# Patient Record
Sex: Female | Born: 1950 | Race: White | Hispanic: No | Smoking: Former smoker
Health system: Southern US, Community
[De-identification: ages and names within clinical notes are randomized; demographics above are authoritative.]

## PROBLEM LIST (undated history)

## (undated) DIAGNOSIS — M542 Cervicalgia: Secondary | ICD-10-CM

## (undated) DIAGNOSIS — M1990 Unspecified osteoarthritis, unspecified site: Secondary | ICD-10-CM

## (undated) DIAGNOSIS — F419 Anxiety disorder, unspecified: Secondary | ICD-10-CM

## (undated) DIAGNOSIS — M6740 Ganglion, unspecified site: Secondary | ICD-10-CM

## (undated) DIAGNOSIS — L988 Other specified disorders of the skin and subcutaneous tissue: Secondary | ICD-10-CM

## (undated) DIAGNOSIS — R112 Nausea with vomiting, unspecified: Secondary | ICD-10-CM

## (undated) DIAGNOSIS — T464X5A Adverse effect of angiotensin-converting-enzyme inhibitors, initial encounter: Secondary | ICD-10-CM

## (undated) DIAGNOSIS — F32A Depression, unspecified: Secondary | ICD-10-CM

## (undated) DIAGNOSIS — M19049 Primary osteoarthritis, unspecified hand: Secondary | ICD-10-CM

## (undated) DIAGNOSIS — Z87891 Personal history of nicotine dependence: Secondary | ICD-10-CM

## (undated) DIAGNOSIS — R05 Cough: Secondary | ICD-10-CM

## (undated) DIAGNOSIS — I10 Essential (primary) hypertension: Secondary | ICD-10-CM

## (undated) DIAGNOSIS — Z9889 Other specified postprocedural states: Secondary | ICD-10-CM

## (undated) DIAGNOSIS — K219 Gastro-esophageal reflux disease without esophagitis: Secondary | ICD-10-CM

## (undated) DIAGNOSIS — L509 Urticaria, unspecified: Secondary | ICD-10-CM

## (undated) DIAGNOSIS — F329 Major depressive disorder, single episode, unspecified: Secondary | ICD-10-CM

## (undated) DIAGNOSIS — C801 Malignant (primary) neoplasm, unspecified: Secondary | ICD-10-CM

## (undated) DIAGNOSIS — F4323 Adjustment disorder with mixed anxiety and depressed mood: Secondary | ICD-10-CM

## (undated) HISTORY — DX: Personal history of nicotine dependence: Z87.891

## (undated) HISTORY — PX: COLONOSCOPY: SHX174

## (undated) HISTORY — DX: Cervicalgia: M54.2

## (undated) HISTORY — DX: Urticaria, unspecified: L50.9

## (undated) HISTORY — PX: LAPAROSCOPIC APPENDECTOMY: SUR753

## (undated) HISTORY — DX: Adverse effect of angiotensin-converting-enzyme inhibitors, initial encounter: T46.4X5A

## (undated) HISTORY — DX: Primary osteoarthritis, unspecified hand: M19.049

## (undated) HISTORY — DX: Adjustment disorder with mixed anxiety and depressed mood: F43.23

## (undated) HISTORY — DX: Other specified disorders of the skin and subcutaneous tissue: L98.8

## (undated) HISTORY — PX: SHOULDER SURGERY: SHX246

## (undated) HISTORY — DX: Ganglion, unspecified site: M67.40

## (undated) HISTORY — DX: Essential (primary) hypertension: I10

## (undated) HISTORY — PX: MASTECTOMY COMPLETE / SIMPLE W/ SENTINEL NODE BIOPSY: SUR846

## (undated) HISTORY — DX: Cough: R05

## (undated) HISTORY — PX: APPENDECTOMY: SHX54

## (undated) NOTE — *Deleted (*Deleted)
[facility]  [address] [place],  [place]  [address] [phone]  Clinic Day:  01/19/2020  Referring physician: Madelin Headings, MD   This document serves as a record of services personally performed by Heidi Pray, MD. It was created on their behalf by Curry,Lauren E, a trained medical scribe. The creation of this record is based on the scribe's personal observations and the provider's statements to them.   CHIEF COMPLAINT:  CC: History of stage IA HER2 receptor positive right breast cancer  Current Treatment:  Surveillance   HISTORY OF PRESENT ILLNESS:  [name] is a 46 y.o. female with a history of stage IA (T1c N0 M0) HER 2 Neu receptor positive right breast cancer diagnosed in November of 2014.  She had multifocal disease on MRI scan, so opted for bilateral mastectomies with reconstruction, which was done in January 2015.  Pathology revealed multifocal, grade 3, invasive ductal carcinoma of the right breast with the largest lesion measuring 1.7 cm with negative nodes.  Estrogen and progesterone receptors were negative and HER 2 Neu positive.  Ki 67 was 57%.  Adjuvant chemotherapy with docetaxel, carboplatin and trastuzumab was recommended.  She received 2 cycles, but had severe toxicities and was hospitalized after each cycle.  We switched her to weekly paclitaxel with trastuzumab, but she still had significant toxicities with severe skin rash.  We then substituted Abraxane for the paclitaxel, and she did not tolerate this regimen either, so chemotherapy was discontinued.  She did complete a full year of trastuzumab.  Her last echocardiogram in January of 2016 remained normal.    She is here for routine follow up and notes a dull pain in her mid left side that radiates to her mid back for the past 1-2 weeks.  She rates this pain as a 2/10 today.  She states that she fell off the steps, and hit her head and left knee 6 weeks ago, but  denies concussion.  She underwent surgery for her carpal tunnel of the right wrist on November 6th.  She denies changes in either breast.  Her appetite is good, and she has gained 6 1/2 pounds since her last visit.  She denies fever, or chills.  She denies nausea, vomiting, bowel issues, or abdominal pain.  She denies sore throat, cough, dyspnea, or chest pain.  She is due for routine colonoscopy.   INTERVAL HISTORY:  [name] is here for routine follow up ***.   Her  appetite is good, and she has gained/lost _ pounds since her last visit.  She denies fever, chills or other signs of infection.  She denies nausea, vomiting, bowel issues, or abdominal pain.  She denies sore throat, cough, dyspnea, or chest pain.   REVIEW OF SYSTEMS:  Review of Systems - Oncology   VITALS:  There were no vitals taken for this visit.  Wt Readings from Last 3 Encounters:  09/01/18 237 lb 3.2 oz (107.6 kg)  03/15/18 235 lb (106.6 kg)  07/13/17 232 lb 3.2 oz (105.3 kg)    There is no height or weight on file to calculate BMI.  Performance status (ECOG): {CHL ONC [id number]  PHYSICAL EXAM:  Physical Exam  LABS:   CBC Latest Ref Rng & Units 09/01/2018 05/04/2017 03/09/2017  WBC 4.0 - 10.5 K/uL 5.1 5.7 6.4  Hemoglobin 12.0 - 15.0 g/dL 87.5 64.3 32.9  Hematocrit 36 - 46 % 43.1 41.6 42.9  Platelets 150 - 400 K/uL 164.0 182.0 174.0  CMP Latest Ref Rng & Units 09/01/2018 06/10/2017 05/04/2017  Glucose 70 - 99 mg/dL 147(W) 295(A) 213(Y)  BUN 6 - 23 mg/dL 19 22 21   Creatinine 0.40 - 1.20 mg/dL 8.65 7.84 6.96  Sodium 135 - 145 mEq/L 139 141 141  Potassium 3.5 - 5.1 mEq/L 4.7 3.7 3.4(L)  Chloride 96 - 112 mEq/L 102 101 100  CO2 19 - 32 mEq/L 31 31 31   Calcium 8.4 - 10.5 mg/dL 9.5 9.6 9.8  Total Protein 6.0 - 8.3 g/dL 6.7 - -  Total Bilirubin 0.2 - 1.2 mg/dL 1.0 - -  Alkaline Phos 39 - 117 U/L 71 - -  AST 0 - 37 U/L 25 - -  ALT 0 - 35 U/L 25 - -     No results found for: CEA1 / No results found for: CEA1  No results found for: PSA1 No results found for: EXB284 No results found for: XLK440  No results found for: TOTALPROTELP, ALBUMINELP, A1GS, A2GS, BETS, BETA2SER, GAMS, MSPIKE, SPEI No results found for: TIBC, FERRITIN, IRONPCTSAT No results found for: LDH   STUDIES:  No results found.   Allergies:  Allergies  Allergen Reactions  . Ace Inhibitors Cough  . Cephalexin Hives  . Cephalosporins Hives    Possible allergy -- hives   . Codeine Itching and Nausea Only  . Tramadol Hives    Current Medications: Current Outpatient Medications  Medication Sig Dispense Refill  . ALPRAZolam (XANAX) 0.25 MG tablet Take 1 tablet (0.25 mg total) by mouth 3 (three) times daily as needed for anxiety. 24 tablet 0  . blood glucose meter kit and supplies KIT Dispense based on patient and insurance preference. Use up to four times daily as directed. (FOR ICD-9 250.00, 250.01). 1 each 0  . FLUoxetine (PROZAC) 20 MG capsule TAKE 1 CAP DAILY. PLEASE SCHEDULE FOLLOW UP APPOINTMENT FOR REFILLS. [phone] 30 capsule 0  . glucose blood test strip Use as instructed up to 3 times a day 100 each 12  . loratadine (CLARITIN) 10 MG tablet TAKE 1 TABLET BY MOUTH EVERY DAY 90 tablet 1  . metFORMIN (GLUCOPHAGE-XR) 500 MG 24 hr tablet TAKE 2 TABLETS BY MOUTH EVERY DAY 180 tablet 1  . metoprolol tartrate (LOPRESSOR) 50 MG tablet TAKE 1/2 TABLET TWICE A DAY 30 tablet 2  . rosuvastatin (CRESTOR) 10 MG tablet TAKE 1 TABLET BY MOUTH 3 TIMES A WEEK FOR A COUPLE WEEKS THEN INCREASE TO DAILY IF TOLERATED 90 tablet 1   No current facility-administered medications for this visit.     ASSESSMENT & PLAN:   Assessment:   1.  Stage IA HER2 positive right breast cancer diagnosed in November 2014.  She was treated with surgery, partial chemotherapy and a full year of trastuzumab.  She remains without evidence of disease.  Plan: I have recommended that she see a gastroenterologist to evaluate her left flank pain and arrange  for screening colonoscopy.  I can see her back in 1 year for reexamination and she knows she can call sooner if problems arise regarding her breast cancer.  She understands and agrees with this plan of care.   I provided *** minutes (2:39 PM - 2:39 PM) of face-to-face time during this this encounter and > 50% was spent counseling as documented under my assessment and plan.    Dellia Beckwith, MD [facility] [address] [place] [address] Dept: [phone] Dept Fax: [phone]   I, ***,  am acting as scribe for Dellia Beckwith, MD  I have reviewed this report as typed by the medical scribe, and it is complete and accurate.  Nicoletta Ba

---

## 1987-03-04 HISTORY — PX: ABDOMINAL HYSTERECTOMY: SHX81

## 1997-11-13 ENCOUNTER — Emergency Department (HOSPITAL_COMMUNITY): Admission: EM | Admit: 1997-11-13 | Discharge: 1997-11-13 | Payer: Self-pay | Admitting: Internal Medicine

## 1999-03-04 HISTORY — PX: WRIST SURGERY: SHX841

## 1999-07-22 ENCOUNTER — Ambulatory Visit (HOSPITAL_COMMUNITY): Admission: RE | Admit: 1999-07-22 | Discharge: 1999-07-22 | Payer: Self-pay | Admitting: *Deleted

## 2002-08-22 ENCOUNTER — Ambulatory Visit (HOSPITAL_COMMUNITY): Admission: RE | Admit: 2002-08-22 | Discharge: 2002-08-22 | Payer: Self-pay | Admitting: Internal Medicine

## 2002-08-22 ENCOUNTER — Encounter: Payer: Self-pay | Admitting: Internal Medicine

## 2004-05-06 ENCOUNTER — Ambulatory Visit: Payer: Self-pay | Admitting: Internal Medicine

## 2005-02-03 ENCOUNTER — Ambulatory Visit: Payer: Self-pay | Admitting: Internal Medicine

## 2005-02-12 ENCOUNTER — Ambulatory Visit: Payer: Self-pay

## 2005-02-12 ENCOUNTER — Encounter: Payer: Self-pay | Admitting: Cardiology

## 2005-02-13 ENCOUNTER — Encounter: Payer: Self-pay | Admitting: Internal Medicine

## 2005-02-13 ENCOUNTER — Ambulatory Visit: Payer: Self-pay | Admitting: Cardiology

## 2005-09-08 ENCOUNTER — Ambulatory Visit: Payer: Self-pay | Admitting: Internal Medicine

## 2006-09-21 ENCOUNTER — Other Ambulatory Visit: Admission: RE | Admit: 2006-09-21 | Discharge: 2006-09-21 | Payer: Self-pay | Admitting: Gynecology

## 2006-09-28 ENCOUNTER — Encounter: Payer: Self-pay | Admitting: Internal Medicine

## 2006-11-25 DIAGNOSIS — J45909 Unspecified asthma, uncomplicated: Secondary | ICD-10-CM | POA: Insufficient documentation

## 2006-11-30 ENCOUNTER — Ambulatory Visit: Payer: Self-pay | Admitting: Internal Medicine

## 2006-11-30 DIAGNOSIS — M19049 Primary osteoarthritis, unspecified hand: Secondary | ICD-10-CM | POA: Insufficient documentation

## 2006-11-30 DIAGNOSIS — M6740 Ganglion, unspecified site: Secondary | ICD-10-CM | POA: Insufficient documentation

## 2006-11-30 HISTORY — DX: Primary osteoarthritis, unspecified hand: M19.049

## 2007-10-21 ENCOUNTER — Telehealth: Payer: Self-pay | Admitting: *Deleted

## 2007-11-22 ENCOUNTER — Ambulatory Visit: Payer: Self-pay | Admitting: Internal Medicine

## 2007-11-22 DIAGNOSIS — I10 Essential (primary) hypertension: Secondary | ICD-10-CM | POA: Insufficient documentation

## 2007-11-22 DIAGNOSIS — M542 Cervicalgia: Secondary | ICD-10-CM

## 2007-11-22 DIAGNOSIS — F4323 Adjustment disorder with mixed anxiety and depressed mood: Secondary | ICD-10-CM

## 2007-11-22 HISTORY — DX: Cervicalgia: M54.2

## 2007-11-22 HISTORY — DX: Adjustment disorder with mixed anxiety and depressed mood: F43.23

## 2007-11-22 HISTORY — DX: Essential (primary) hypertension: I10

## 2007-12-20 ENCOUNTER — Ambulatory Visit: Payer: Self-pay | Admitting: Internal Medicine

## 2007-12-20 LAB — CONVERTED CEMR LAB: Hemoglobin: 15.2 g/dL

## 2007-12-24 ENCOUNTER — Encounter: Payer: Self-pay | Admitting: Internal Medicine

## 2007-12-24 LAB — CONVERTED CEMR LAB
BUN: 13 mg/dL (ref 6–23)
CO2: 31 meq/L (ref 19–32)
Calcium: 9.3 mg/dL (ref 8.4–10.5)
Chloride: 104 meq/L (ref 96–112)
Cholesterol: 205 mg/dL (ref 0–200)
Creatinine, Ser: 0.8 mg/dL (ref 0.4–1.2)
Direct LDL: 130.6 mg/dL
GFR calc Af Amer: 95 mL/min
GFR calc non Af Amer: 79 mL/min
Glucose, Bld: 122 mg/dL — ABNORMAL HIGH (ref 70–99)
HDL: 41.6 mg/dL (ref >39.0)
Potassium: 3.9 meq/L (ref 3.5–5.1)
Sodium: 143 meq/L (ref 135–145)
Total CHOL/HDL Ratio: 4.9
Triglycerides: 101 mg/dL (ref 0–149)
VLDL: 20 mg/dL (ref 0–40)

## 2008-11-01 ENCOUNTER — Encounter (INDEPENDENT_AMBULATORY_CARE_PROVIDER_SITE_OTHER): Payer: Self-pay | Admitting: *Deleted

## 2008-11-09 ENCOUNTER — Telehealth: Payer: Self-pay | Admitting: Internal Medicine

## 2008-11-20 ENCOUNTER — Ambulatory Visit: Payer: Self-pay | Admitting: Gastroenterology

## 2008-12-04 ENCOUNTER — Ambulatory Visit (HOSPITAL_COMMUNITY): Admission: RE | Admit: 2008-12-04 | Discharge: 2008-12-04 | Payer: Self-pay | Admitting: Gastroenterology

## 2008-12-04 ENCOUNTER — Ambulatory Visit: Payer: Self-pay | Admitting: Gastroenterology

## 2008-12-05 ENCOUNTER — Encounter: Payer: Self-pay | Admitting: Gastroenterology

## 2008-12-20 ENCOUNTER — Telehealth: Payer: Self-pay | Admitting: Internal Medicine

## 2008-12-25 ENCOUNTER — Ambulatory Visit: Payer: Self-pay | Admitting: Internal Medicine

## 2008-12-25 DIAGNOSIS — Z87891 Personal history of nicotine dependence: Secondary | ICD-10-CM

## 2008-12-25 DIAGNOSIS — L988 Other specified disorders of the skin and subcutaneous tissue: Secondary | ICD-10-CM

## 2008-12-25 HISTORY — DX: Other specified disorders of the skin and subcutaneous tissue: L98.8

## 2008-12-25 HISTORY — DX: Personal history of nicotine dependence: Z87.891

## 2008-12-28 LAB — CONVERTED CEMR LAB
BUN: 17 mg/dL (ref 6–23)
CO2: 31 meq/L (ref 19–32)
Calcium: 9.9 mg/dL (ref 8.4–10.5)
Chloride: 101 meq/L (ref 96–112)
Creatinine, Ser: 0.7 mg/dL (ref 0.4–1.2)
GFR calc non Af Amer: 91.29 mL/min (ref >60)
Glucose, Bld: 110 mg/dL — ABNORMAL HIGH (ref 70–99)
Potassium: 4.6 meq/L (ref 3.5–5.1)
Sodium: 140 meq/L (ref 135–145)

## 2010-03-26 ENCOUNTER — Telehealth: Payer: Self-pay | Admitting: *Deleted

## 2010-03-28 ENCOUNTER — Encounter: Payer: Self-pay | Admitting: Internal Medicine

## 2010-04-04 NOTE — Progress Notes (Signed)
Summary: Pt sch ov for 04/08/10. Need partial refill of Triamterene  Phone Note Call from Patient Call back at Work Phone [phone]   Caller: Patient Summary of Call: Pt has check an ov to see Dr Fabian Sharp on 04/08/10 at 3pm, but pt needs refill of Triamterene called in to [facility] in [place], to last until her appt date.   Initial call taken by: Lucy Antigua,  March 26, 2010 9:25 AM    Prescriptions: TRIAMTERENE-HCTZ 37.5-25 MG  TABS (TRIAMTERENE-HCTZ) take 1/2 tablet by mouth once daily  #30 x 0   Entered by:   Romualdo Bolk, CMA (AAMA)   Authorized by:   Madelin Headings MD   Signed by:   Romualdo Bolk, CMA (AAMA) on 03/26/2010   Method used:   Electronically to        [facility] (retail)       [address]       [place], [place]  [address]       Ph: [phone] or [phone]       Fax: [phone]   RxID:   [id number]

## 2010-04-08 ENCOUNTER — Ambulatory Visit: Payer: Self-pay | Admitting: Internal Medicine

## 2010-04-09 ENCOUNTER — Other Ambulatory Visit: Payer: Self-pay | Admitting: Internal Medicine

## 2010-04-09 NOTE — Telephone Encounter (Signed)
I only gave pt a 30 days supply since she is due for a follow up appt. I mailed pt a letter stating that she needs to call the office to schedule a follow up appt before next refill.

## 2010-04-18 ENCOUNTER — Other Ambulatory Visit: Payer: Self-pay | Admitting: Internal Medicine

## 2010-04-22 ENCOUNTER — Encounter: Payer: Self-pay | Admitting: Internal Medicine

## 2010-04-22 ENCOUNTER — Ambulatory Visit (INDEPENDENT_AMBULATORY_CARE_PROVIDER_SITE_OTHER): Payer: BC Managed Care – PPO | Admitting: Internal Medicine

## 2010-04-22 DIAGNOSIS — J45909 Unspecified asthma, uncomplicated: Secondary | ICD-10-CM

## 2010-04-22 DIAGNOSIS — I8390 Asymptomatic varicose veins of unspecified lower extremity: Secondary | ICD-10-CM

## 2010-04-22 DIAGNOSIS — I10 Essential (primary) hypertension: Secondary | ICD-10-CM

## 2010-04-22 DIAGNOSIS — E669 Obesity, unspecified: Secondary | ICD-10-CM

## 2010-04-22 DIAGNOSIS — Z0000 Encounter for general adult medical examination without abnormal findings: Secondary | ICD-10-CM

## 2010-04-22 DIAGNOSIS — F4323 Adjustment disorder with mixed anxiety and depressed mood: Secondary | ICD-10-CM

## 2010-04-22 MED ORDER — TRIAMTERENE-HCTZ 37.5-25 MG PO TABS: 0.5000 | ORAL_TABLET | Freq: Every day | ORAL | Status: DC

## 2010-04-22 MED ORDER — FLUOXETINE HCL 10 MG PO CAPS: 10.0000 mg | ORAL_CAPSULE | Freq: Every day | ORAL | Status: DC

## 2010-04-23 LAB — BASIC METABOLIC PANEL
BUN: 19 mg/dL (ref 6–23)
Creatinine, Ser: 0.8 mg/dL (ref 0.4–1.2)
GFR: 82.64 mL/min (ref >60.00)
Potassium: 3.8 mEq/L (ref 3.5–5.1)

## 2010-04-23 LAB — LIPID PANEL
Cholesterol: 200 mg/dL (ref 0–200)
LDL Cholesterol: 110 mg/dL — ABNORMAL HIGH (ref 0–99)
VLDL: 40 mg/dL (ref 0.0–40.0)

## 2010-04-23 LAB — HEMOGLOBIN: Hemoglobin: 14 g/dL (ref 12.0–15.0)

## 2010-04-24 ENCOUNTER — Encounter: Payer: Self-pay | Admitting: *Deleted

## 2010-04-25 ENCOUNTER — Encounter: Payer: Self-pay | Admitting: Internal Medicine

## 2010-04-25 DIAGNOSIS — I8390 Asymptomatic varicose veins of unspecified lower extremity: Secondary | ICD-10-CM | POA: Insufficient documentation

## 2010-04-25 DIAGNOSIS — E669 Obesity, unspecified: Secondary | ICD-10-CM | POA: Insufficient documentation

## 2010-04-25 NOTE — Assessment & Plan Note (Signed)
Controlled and no se noted with meds

## 2010-04-25 NOTE — Assessment & Plan Note (Signed)
Problematic  Counseled.

## 2010-04-25 NOTE — Assessment & Plan Note (Signed)
Disc options  And will continue as seems to help her mood and anxiety a good bit.

## 2010-04-25 NOTE — Assessment & Plan Note (Signed)
quiescent

## 2010-04-25 NOTE — Assessment & Plan Note (Signed)
Ok to do lipids  And lab as covered by her plan

## 2010-04-25 NOTE — Progress Notes (Signed)
  Subjective:    Patient ID: [name], female    DOB: 1950/12/23, 60 y.o.   MRN: [id number]  HPI  Patient comes in today  For yearly follow up of her  multiple medical issues and med checks. No major change in health status since last visit .   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 HT  Taking med and no se  Mood: helps to stay on prozac. Obesity  :  Realized  A problem Varicose veins : no ulcerations but has sx with this. Wants lab done covered by her plan.  Review of Systems Neg cv pulm sleep  Gi Gu change   See hpi     Objective:   Physical Exam Physical Exam: Vital signs reviewed GLO:VFIE is a well-developed well-nourished alert cooperative  white female who appears her stated age in no acute distress.  HEENT: normocephalic  traumatic , Eyes: PERRL EOM's full, conjunctiva clear, Nares: paten,t no deformity discharge or tenderness., Ears: no deformity EAC's clear TMs with normal landmarks  Left a bit  scarred. Mouth: clear OP, no lesions, edema.  Moist mucous membranes. Dentition in adequate repair. NECK: supple without masses, thyromegaly or bruits. CHEST/PULM:  Clear to auscultation and percussion breath sounds equal no wheeze , rales or rhonchi. No chest wall deformities or tenderness. CV: PMI is nondisplaced, S1 S2 no gallops, murmurs, rubs. Peripheral pulses are full without delay.No JVD .  ABDOMEN: Bowel sounds normal nontender  No guard or rebound, no hepato splenomegal no CVA tenderness.  No hernia. Extremtities:  No clubbing cyanosis   slgiht edema and varicosities without ulceration NEURO:  Oriented x3, cranial nerves 3-12 appear to be intact, no obvious focal weakness,gait within normal limits no abnormal reflexes or asymmetrical SKIN: No acute rashes normal turgor, color, no bruising or petechiae. PSYCH: Oriented, good eye contact, no obvious depression anxiety, cognition and judgment appear normal.         Assessment & Plan:  HT    No change Obesity   Lose weight Mood: stable and stay on meds VV:     Disc options  .  Symptomatic   rx limited by finances

## 2010-07-02 ENCOUNTER — Encounter: Payer: Self-pay | Admitting: Internal Medicine

## 2010-07-18 ENCOUNTER — Telehealth: Payer: Self-pay | Admitting: *Deleted

## 2010-07-18 NOTE — Telephone Encounter (Signed)
Pt is complaining of SOB and chest pain coming off and on when she is at work.  Advised that she should go to the ER to R/O heart problems.  Dr. Fabian Sharp is off today, and our protocol is to send suspicious chest pain to the ER first,

## 2010-07-19 NOTE — Telephone Encounter (Signed)
No answer, no voicemail.

## 2010-07-19 NOTE — Telephone Encounter (Signed)
Please check on patient about how she is doing

## 2010-07-22 NOTE — Telephone Encounter (Signed)
Tried to call pt back but the voicemail box was full and I couldn't leave a message.

## 2010-07-24 ENCOUNTER — Telehealth: Payer: Self-pay | Admitting: *Deleted

## 2010-07-24 NOTE — Telephone Encounter (Signed)
Notified pt. 

## 2010-07-24 NOTE — Telephone Encounter (Signed)
Per Dr. Fabian Sharp- Ok to do and have pt come in for a rov in 2 weeks.

## 2010-07-24 NOTE — Telephone Encounter (Signed)
Pt went to the ER last night.  BP was high, and was given Maxide25/37.4 mg. One po daily. Her  BP was 209/110. Given Lopressor 25 mg. 1/2 q 12 hours.  Wants to know if Dr. Fabian Sharp wants her to take this?

## 2010-08-05 ENCOUNTER — Encounter: Payer: Self-pay | Admitting: Internal Medicine

## 2010-08-05 ENCOUNTER — Ambulatory Visit (INDEPENDENT_AMBULATORY_CARE_PROVIDER_SITE_OTHER): Payer: BC Managed Care – PPO | Admitting: Internal Medicine

## 2010-08-05 VITALS — BP 120/70 | HR 78 | Wt 245.0 lb

## 2010-08-05 DIAGNOSIS — I10 Essential (primary) hypertension: Secondary | ICD-10-CM

## 2010-08-05 DIAGNOSIS — Z87891 Personal history of nicotine dependence: Secondary | ICD-10-CM

## 2010-08-05 DIAGNOSIS — R079 Chest pain, unspecified: Secondary | ICD-10-CM

## 2010-08-05 DIAGNOSIS — F4323 Adjustment disorder with mixed anxiety and depressed mood: Secondary | ICD-10-CM

## 2010-08-05 DIAGNOSIS — E669 Obesity, unspecified: Secondary | ICD-10-CM

## 2010-08-05 MED ORDER — METOPROLOL TARTRATE 25 MG PO TABS: ORAL_TABLET | ORAL | Status: DC

## 2010-08-05 MED ORDER — TRIAMTERENE-HCTZ 37.5-25 MG PO TABS: 1.0000 | ORAL_TABLET | Freq: Every day | ORAL | Status: DC

## 2010-08-05 MED ORDER — METOPROLOL SUCCINATE ER 25 MG PO TB24: 25.0000 mg | ORAL_TABLET | Freq: Every day | ORAL | Status: DC

## 2010-08-05 NOTE — Patient Instructions (Signed)
Continue same dose of med. Will refill. Agree with getting  Bp monitor  Large cuff to  Monitor you Bp readings.  Call in readings in 3 months.   Consider getting stress test  As we discussed and certainly if having chest discomfort .

## 2010-08-18 ENCOUNTER — Encounter: Payer: Self-pay | Admitting: Internal Medicine

## 2010-08-18 DIAGNOSIS — R079 Chest pain, unspecified: Secondary | ICD-10-CM | POA: Insufficient documentation

## 2010-08-18 NOTE — Progress Notes (Signed)
Subjective:    Patient ID: [name], female    DOB: [birth year], 60 y.o.   MRN: [id number]  HPI  Pt comes  in today for  Follow up from ed visit [place]  5 22 for 3 weeks of chest pain left to back and concerns  . Some HB sx also No associated sx otherwise.   Had eval with neg c xray , ekg 2 sets of enzymes blood pressure was in 190 range and given ntg and then lopressor. Since that time taking  Bid and   Feels pretty good. She remains on her diuretic.rest of labs ok but hyperglycemia. No other change in exercise tolerance insn't  really interested in stress test because doing well and   Feels that this was stress after a difficult conversation with someone  And has high deductible insurance and unsure of value of the testing.  She is remaining tobacco free.  Asks about asa 81 mg. Taking   Review of Systems No current cp sob change neck pain vision change, cough ,swelling weight loss fever  weakness  Syncope.  Or OSa sx. Bruising or bleeding or unusual rashes. On fluoxetine  Without se noted and thinks this is helping her stress rest of ros negative  .   No hx of dm or cad or vascular disease.  Did have a" life screen"  In 2006 showing "mild to mod  carotid disease".  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 Has VV  And hx of some procedure     Objective:   Physical Exam WdWn in nad  HEENT: Normocephalic ;atraumatic , Eyes;  PERRL, EOMs  Full, lids and conjunctiva clear,,Ears: no deformities, canals nl, TM landmarks normal, Nose: no deformity or discharge  Mouth : OP clear without lesion or edema . Neck: no masses, bruit ,adenopathy Chest:  Clear to A&P without wheezes rales or rhonchi CV:  S1-S2 no gallops or murmurs peripheral perfusion is normal Neuro grossly normal Ext: No clubbing cyanosis or edema  Has vv no ulcerations Oriented x 3 and no noted deficits in memory, attention, and speech.  Bp  Confirmed . Ed note reviewed as well as  Echo from  2006 and cards note then .       Assessment & Plan:  Chest pain atypical with some risk  Age and ht.  Ex smoker Underinsurance .  Self employed . Hypertension with some possible white coat effect  ?control ... Echo in 2004 and negative  stress test over 10 years ago? Marland KitchenMarland Kitchen Anxiety  Dep  Seems stable  On prozac Obesity  Hx of asthma but no sx on the lopressor She feels she is doing so much better .  B blocker seems a good choice for her situation add on and tolerating well. Thus  Will  Continue at low dose ir bid may be less expensive . Advise Cinda Quest abrupt discontinuation. It is reasonable to take low dose asa 81 at this time.      Discussed risk benefit of testing   ....  Since she has not sx now  will continue for now  If she has  persistent or progressive   Sx she should get further evaluation.  Alarm sx reviewed for which she should see emergent care . For now we can try monitoring  Via phone if her BP is controlled. If not will need to recheck earlier otherwise Ov every 6-12 months.  Patient appears to understand  Choices and implications of such.

## 2011-02-26 ENCOUNTER — Encounter: Payer: Self-pay | Admitting: Internal Medicine

## 2011-02-26 ENCOUNTER — Ambulatory Visit (INDEPENDENT_AMBULATORY_CARE_PROVIDER_SITE_OTHER): Payer: BC Managed Care – PPO | Admitting: Internal Medicine

## 2011-02-26 VITALS — BP 138/70 | HR 79 | Temp 98.0°F | Wt 244.0 lb

## 2011-02-26 DIAGNOSIS — M19049 Primary osteoarthritis, unspecified hand: Secondary | ICD-10-CM

## 2011-02-26 DIAGNOSIS — I10 Essential (primary) hypertension: Secondary | ICD-10-CM

## 2011-02-26 MED ORDER — LISINOPRIL-HYDROCHLOROTHIAZIDE 20-25 MG PO TABS: 1.0000 | ORAL_TABLET | Freq: Every day | ORAL | Status: DC

## 2011-02-26 NOTE — Assessment & Plan Note (Signed)
Change to ace diur combo  An stay on b blocker for now  Consider other adjustments .  rov 3 months with monitor   . High deductable cost a factor.

## 2011-02-26 NOTE — Progress Notes (Signed)
  Subjective:    Patient ID: [name], female    DOB: 1950-07-13, 60 y.o.   MRN: [id number]  HPI Patient comes in today for SDA  For acute problem evaluation. Because of worsening bp readings and was feeling like she did when went to ed in the past. With  Back discomfort and  HA ocass .  Has been tracking bp readings since last visit and was 150 range   Today 160  /99 today  Often forgets about the send dose of metoprolol  Still owrking 4 days per week . Busy with mom taking care of mom fx hip. Some aleve with arthritis in hand  Review of Systems No current cp sob edema  oa hnad arthritis  Last nsaid last week .   Past history family history social history reviewed in the electronic medical record.     Objective:   Physical Exam  BP reading right 144/84 reg   138-140/80 large  Neck: Supple without adenopathy or masses or bruits Chest:  Clear to A&P without wheezes rales or rhonchi CV:  S1-S2 no gallops or murmurs peripheral perfusion is normal No clubbing cyanosis or edema Extr mild oa changes       Assessment & Plan:  hypertension hx of same  Increasing  Systolic  150 range with reg cuff at home Change to ace diuretic combo   For now   Consider changing to metop xr .   Instead of bid dosing  Disc use of med for oa  And bp meds   Plan rov ( or call)  In about 3 months  Can  Do bmp at that time if needed    Disc potential se of changed meds  Continue b blocker stop tmp sxt

## 2011-02-26 NOTE — Patient Instructions (Addendum)
Change BP med as directed .  Otherwise stay on the other meds. Call or OV ( preferred)   in 3 months and will need labs  eventually .   Bring monitor with you  Aleve and advil  Are  good for arthritis but could interfere with Bp medications Can try tylenol based meds if helpful.  Continue to monitor    Your bp readings   Call if not being controlled

## 2011-03-05 ENCOUNTER — Telehealth: Payer: Self-pay

## 2011-03-05 NOTE — Telephone Encounter (Signed)
Pt states she is having some complications with her new BP medication she started in December.  Pt states she started the lisinopril/hctz on 12/28 and she felt "yucky" and tired.  Pt was all right during the weekend and then this am pt got up out of bed fast and was dizzy.  Pt states she is still dizzy right now and pt is experiencing severe pain between her shoulders that has just started.  Pt did not take her bp medication this morning due to being dizzy. Pt states she does not like the lisinopril/hctz and would like to know what Dr. Fabian Sharp recommends.

## 2011-03-05 NOTE — Telephone Encounter (Signed)
Per Dr. Fabian Sharp- have pt cut 1/2 and see how this works. Pt to call back if she starts to get the symptoms again.

## 2011-04-20 IMAGING — RF DG BE W/ AIR HIGH DENSITY
8 series · 9 of 9 positions shown · IV contrast (agent unspecified)
Comparison: None

CLINICAL DATA: Incomplete colonoscopy.

AIR-CONTRAST BARIUM ENEMA
TECHNIQUE: After obtaining a scout radiograph air-contrast barium
enema was performed under fluoroscopy using high-density barium.
Fluoroscopy Time: 3.4 minutes.
Contrast: High density barium with air.

[Series 1: run · 2 of 2 slices shown (1 of 8)]
[im 1/2]
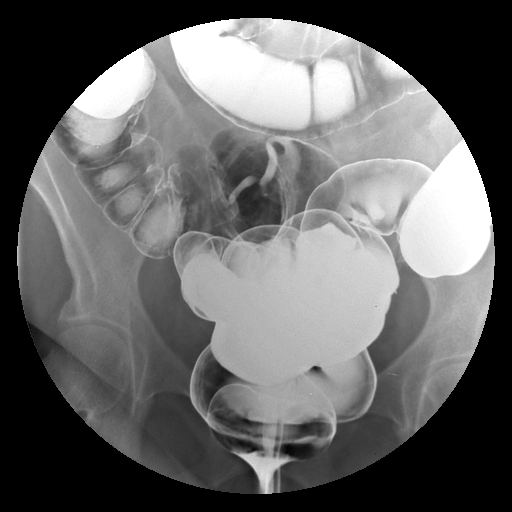
[im 2/2]
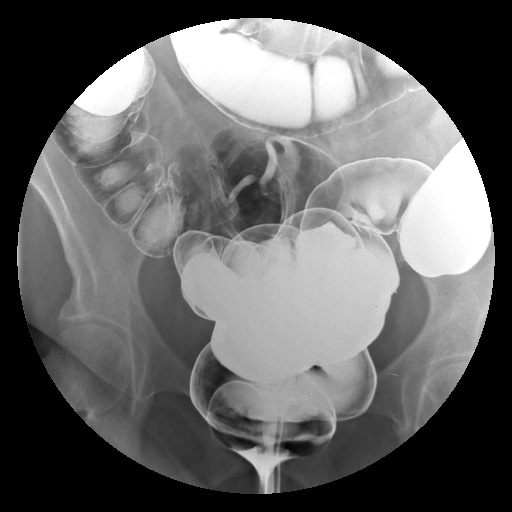

[Series 2: run · 1 of 1 slices shown (2 of 8)]
[im 1/1]
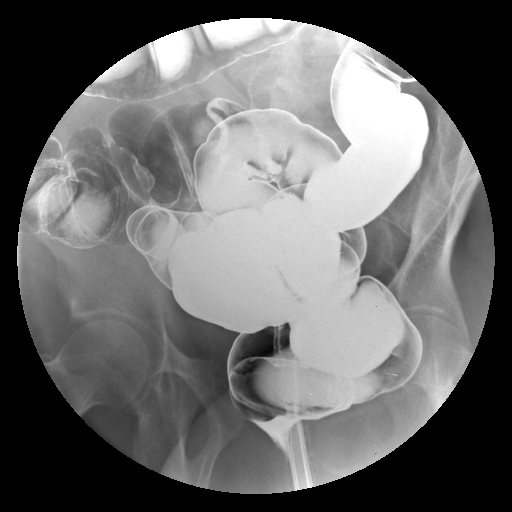

[Series 3: run · 1 of 1 slices shown (3 of 8)]
[im 1/1]
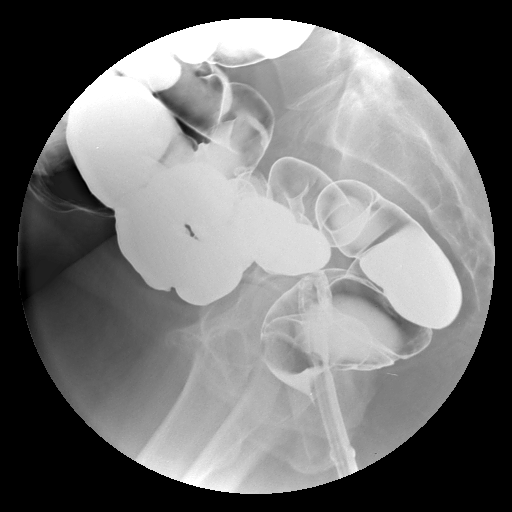

[Series 4: run · 1 of 1 slices shown (4 of 8)]
[im 1/1]
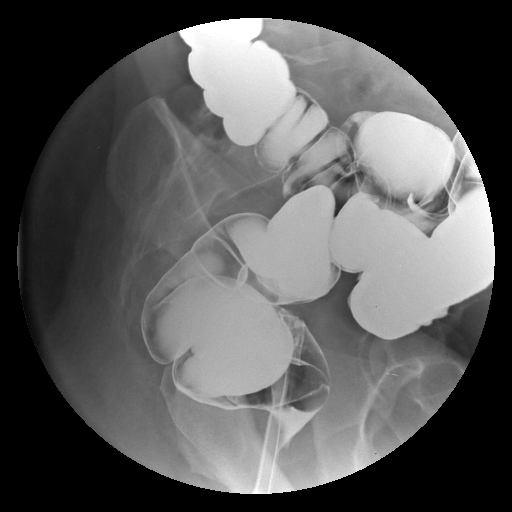

[Series 5: run · 1 of 1 slices shown (5 of 8)]
[im 1/1]
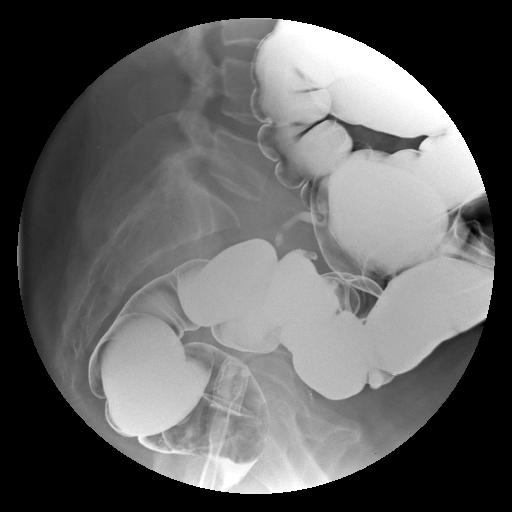

[Series 6: run · 1 of 1 slices shown (6 of 8)]
[im 1/1]
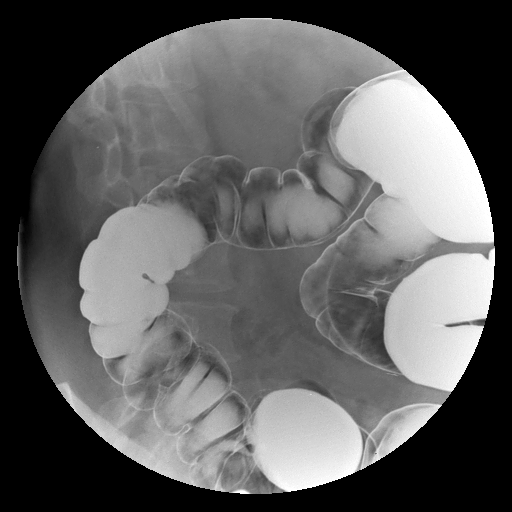

[Series 7: run · 1 of 1 slices shown (7 of 8)]
[im 1/1]
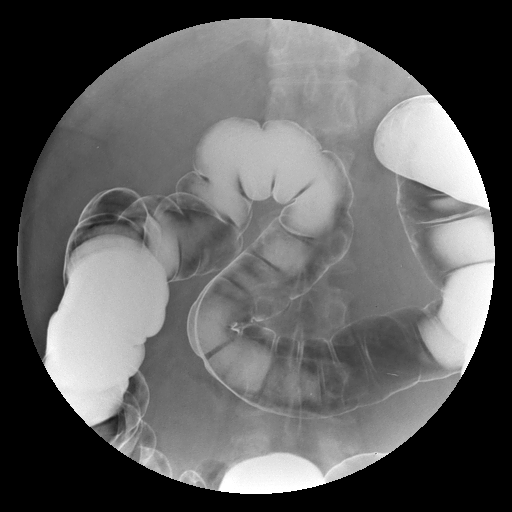

[Series 8: run · 1 of 1 slices shown (8 of 8)]
[im 1/1]
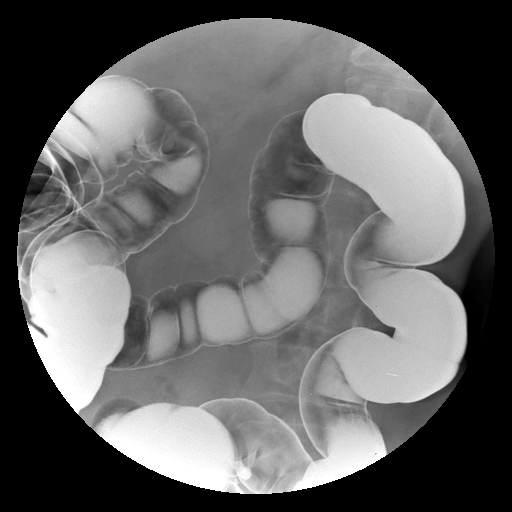

[9 of 9 positions shown; findings below may reference images not displayed]

FINDINGS: There are scattered diverticula in the sigmoid colon.  No
fixed strictures.  No polypoid filling defects within the colon.
No suspicious findings elsewhere throughout the colon.
IMPRESSION: Scattered sigmoid diverticula.  Otherwise unremarkable air contrast
barium enema.

## 2011-04-28 ENCOUNTER — Encounter: Payer: Self-pay | Admitting: Internal Medicine

## 2011-04-28 ENCOUNTER — Ambulatory Visit (INDEPENDENT_AMBULATORY_CARE_PROVIDER_SITE_OTHER): Payer: BC Managed Care – PPO | Admitting: Internal Medicine

## 2011-04-28 DIAGNOSIS — T464X5A Adverse effect of angiotensin-converting-enzyme inhibitors, initial encounter: Secondary | ICD-10-CM

## 2011-04-28 DIAGNOSIS — R05 Cough: Secondary | ICD-10-CM | POA: Insufficient documentation

## 2011-04-28 DIAGNOSIS — R058 Other specified cough: Secondary | ICD-10-CM

## 2011-04-28 DIAGNOSIS — R059 Cough, unspecified: Secondary | ICD-10-CM

## 2011-04-28 DIAGNOSIS — T44905A Adverse effect of unspecified drugs primarily affecting the autonomic nervous system, initial encounter: Secondary | ICD-10-CM

## 2011-04-28 DIAGNOSIS — I10 Essential (primary) hypertension: Secondary | ICD-10-CM

## 2011-04-28 DIAGNOSIS — J040 Acute laryngitis: Secondary | ICD-10-CM

## 2011-04-28 HISTORY — DX: Other specified cough: R05.8

## 2011-04-28 HISTORY — DX: Adverse effect of angiotensin-converting-enzyme inhibitors, initial encounter: T46.4X5A

## 2011-04-28 MED ORDER — BENZONATATE 100 MG PO CAPS: 100.0000 mg | ORAL_CAPSULE | Freq: Three times a day (TID) | ORAL | Status: AC | PRN

## 2011-04-28 MED ORDER — METOPROLOL TARTRATE 50 MG PO TABS: 25.0000 mg | ORAL_TABLET | Freq: Two times a day (BID) | ORAL | Status: DC

## 2011-04-28 NOTE — Patient Instructions (Signed)
I agree that the lisinopril is probably aggravating adding to your coughing fits. We should stop this switch back over to your triamterene hydrochlorothiazide diuretic pill that you were on in the past to take one a day  We will then increase your Lopressor to 25 mg twice a day monitor your blood pressure and pulse.  It may take a few weeks for your cough to be better however if she develops fever or shortness of breath in your chest call us. The meantime increase fluids habits change from cough drops over to sugarless candy to suck on and can try Tessalon Perles for cough suppression.  Increase humidity like in a shower that would help soothe your vocal cords and help for voice rest.  Let us know how your blood pressure is doing in a month or earlier if there are concerns about the cough and the blood pressure control.

## 2011-04-28 NOTE — Progress Notes (Signed)
  Subjective:    Patient ID: [name], female    DOB: March 28, 1950, 61 y.o.   MRN: [id number]  HPI Patient comes in today for an acute appointment with her friend.. she has a concern about a severity of cough that she thinks may be from the medication ocass dry cough  Off and on in the past and then we put her on lisinopril in December.  Last week got a cold ; usually only last 3-4 days  ;head cold  improved but now has very bad coughing fits over the last 3-4 ays and now coughs very badly "dry crazy cough . "  She denies wheezing or asthma and doesn't feel that she has a chest cold or pneumonia symptoms. She has a log of her blood pressure readings and they are anywhere from the 130s to the 140s. She has tried cough drops over-the-counter medications without help She is on low dose beta blocker Lopressor 12.5 mg twice a day. She has a high deductible on her insurance so her using low-cost generics to help control her blood pressure.  Review of Systems Negative for fever or syncope change in vision her hoarseness is without stridor. No nausea vomiting or other illness.  Past history family history social history reviewed in the electronic medical record. 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 Non toxic . HEENT: Normocephalic ;atraumatic , Eyes;  PERRL, EOMs  Full, lids and conjunctiva clear,,Ears: no deformities, canals nl, TM landmarks normal, Nose: no deformity or discharge but congested;face nontender tender Mouth : OP clear without lesion or edema . Neck: Supple without adenopathy or masses or bruits Chest:  Clear to A&P without wheezes rales or rhonchi CV:  S1-S2 no gallops or murmurs peripheral perfusion is normal Skin :nl perfusion and no acute rashes       Assessment & Plan:   Acute respiratory infection that appears convalescent with ACE inhibitor aggravation of cough. No current signs of lower respiratory tract infection. Would consider an asthmatic reaction however patient feels she's not wheezing and it is all in her upper airway.  We'll definitely stop the ACE inhibitor her go back on the Maxzide and increase the Lopressor dosing in the meantime. We could consider other options however there may be financial limitations but of other medications.  Have her stop cough drops and switch to sugar-free candy Tessalon Perles given as a trial in the short run. We'll change her Lopressor to 50 mg and cut it in half twice a day she should keep [place] informed about her blood pressure readings in progress if not we will have to do further evaluation or changes.

## 2011-05-02 ENCOUNTER — Telehealth: Payer: Self-pay | Admitting: *Deleted

## 2011-05-02 NOTE — Telephone Encounter (Signed)
Spoke to pt this has been going for 10-11 days. The cough hasn't changed much. It is less frequent. The head congestion is more a stuffy runny nose. She hasn't tried anything OTC. Advised pt to try claritin, zyrtec or allergra and use saline nasal spray. Then if not better by Monday then give [place] a call.

## 2011-05-02 NOTE — Telephone Encounter (Signed)
Pt is asking if Dr. Fabian Sharp will give her an antibiotic for increased sinus congestion, drainage and cough.   The cough is non productive.  No fever.

## 2011-05-05 ENCOUNTER — Telehealth: Payer: Self-pay | Admitting: *Deleted

## 2011-05-05 MED ORDER — AMOXICILLIN 500 MG PO CAPS: 500.0000 mg | ORAL_CAPSULE | Freq: Three times a day (TID) | ORAL | Status: AC

## 2011-05-05 NOTE — Telephone Encounter (Signed)
Pt aware of this. 

## 2011-05-05 NOTE — Telephone Encounter (Signed)
P is calling back per Dr. Rosezella [place] instructions.

## 2011-05-05 NOTE — Telephone Encounter (Signed)
Spoke to pt- Claritin didn't help over the week. Sinus infection and head congestion x 2 weeks.

## 2011-05-07 ENCOUNTER — Other Ambulatory Visit: Payer: Self-pay | Admitting: Internal Medicine

## 2011-05-26 ENCOUNTER — Ambulatory Visit: Payer: BC Managed Care – PPO | Admitting: Internal Medicine

## 2011-07-28 ENCOUNTER — Other Ambulatory Visit: Payer: Self-pay | Admitting: Internal Medicine

## 2011-07-29 NOTE — Telephone Encounter (Signed)
Pt last seen 04/28/11.  Rx last filled 05/07/11 #90.  Pls advise.

## 2011-07-29 NOTE — Telephone Encounter (Signed)
Ok to refill x 6 months 

## 2011-09-30 ENCOUNTER — Other Ambulatory Visit: Payer: Self-pay | Admitting: Internal Medicine

## 2011-11-07 ENCOUNTER — Other Ambulatory Visit: Payer: Self-pay | Admitting: Internal Medicine

## 2011-12-01 ENCOUNTER — Ambulatory Visit: Payer: BC Managed Care – PPO | Admitting: Family Medicine

## 2011-12-08 ENCOUNTER — Encounter: Payer: Self-pay | Admitting: Internal Medicine

## 2011-12-08 ENCOUNTER — Ambulatory Visit (INDEPENDENT_AMBULATORY_CARE_PROVIDER_SITE_OTHER): Payer: BC Managed Care – PPO | Admitting: Internal Medicine

## 2011-12-08 VITALS — BP 130/78 | HR 74 | Temp 98.4°F | Wt 245.0 lb

## 2011-12-08 DIAGNOSIS — Z8639 Personal history of other endocrine, nutritional and metabolic disease: Secondary | ICD-10-CM

## 2011-12-08 DIAGNOSIS — F4323 Adjustment disorder with mixed anxiety and depressed mood: Secondary | ICD-10-CM

## 2011-12-08 DIAGNOSIS — I8390 Asymptomatic varicose veins of unspecified lower extremity: Secondary | ICD-10-CM

## 2011-12-08 DIAGNOSIS — Z23 Encounter for immunization: Secondary | ICD-10-CM

## 2011-12-08 DIAGNOSIS — R202 Paresthesia of skin: Secondary | ICD-10-CM

## 2011-12-08 DIAGNOSIS — Z299 Encounter for prophylactic measures, unspecified: Secondary | ICD-10-CM

## 2011-12-08 DIAGNOSIS — Z0000 Encounter for general adult medical examination without abnormal findings: Secondary | ICD-10-CM

## 2011-12-08 DIAGNOSIS — I10 Essential (primary) hypertension: Secondary | ICD-10-CM

## 2011-12-08 DIAGNOSIS — R209 Unspecified disturbances of skin sensation: Secondary | ICD-10-CM

## 2011-12-08 MED ORDER — FLUOXETINE HCL 10 MG PO CAPS: 10.0000 mg | ORAL_CAPSULE | Freq: Every day | ORAL | Status: DC

## 2011-12-08 MED ORDER — METOPROLOL TARTRATE 50 MG PO TABS: 25.0000 mg | ORAL_TABLET | Freq: Two times a day (BID) | ORAL | Status: DC

## 2011-12-08 MED ORDER — TRIAMTERENE-HCTZ 37.5-25 MG PO TABS: 1.0000 | ORAL_TABLET | Freq: Every day | ORAL | Status: DC

## 2011-12-08 NOTE — Progress Notes (Signed)
Subjective:    Patient ID: [name], female    DOB: 1951-01-09, 61 y.o.   MRN: [id number]  HPI Patient comes in today  for  new problem evaluation. Onset most recently month of intermittent tingling front  Of legs wheil standing after standing a bit( works as Producer, television/film/video)  Otherwise feels ok no fever weakness severe LBP bowel or bladder changes. Wants to make sure she doesn't have a sugar problem or other.   Her bp has been ok usually high normal but good . Has machine with her today Wishes flu vaccine   Review of Systems ROS:  GEN/ HEENT: No fever, significant weight changes sweats headaches vision problems hearing changes, CV/ PULM; No chest pain shortness of breath cough, syncope,edema  change in exercise tolerance. GI /GU: No adominal pain, vomiting, change in bowel habits. No blood in the stool. No significant GU symptoms. SKIN/HEME: ,no acute skin rashes suspicious lesions or bleeding. No lymphadenopathy, nodules, masses.  NEURO/ PSYCH:  No weakness. No depression anxiety. Stable on current med IMM/ Allergy: No unusual infections.  Allergy .   REST of 12 system review negative except as per HPI     Objective:   Physical Exam BP 130/78  Pulse 74  Temp 98.4 F (36.9 C) (Oral)  Wt 245 lb (111.131 kg)  SpO2 96% Physical Exam: Vital signs reviewed YQM:[id number] is a well-developed well-nourished alert cooperative  white female who appears her stated age in no acute distress.  HEENT: normocephalic atraumatic , Eyes: PERRL EOM's full, conjunctiva clear, Nares: paten,t no deformity discharge or tenderness., Ears: no deformity EAC's clear TMs with normal landmarks. Mouth: clear OP, no lesions, edema.  Moist mucous membranes. Dentition in adequate repair. NECK: supple without masses, thyromegaly or bruits. CHEST/PULM:  Clear to auscultation and percussion breath sounds equal no wheeze , rales or rhonchi.  CV: PMI is nondisplaced, S1 S2 no gallops, murmurs, rubs. Peripheral  pulses are full without delay.No JVD .  ABDOMEN: Bowel sounds normal nontender  No guard or rebound, no hepato splenomegal no CVA tenderness.   Extremtities:  No clubbing cyanosis or edema, no acute joint swelling or redness no focal atrophy has VV no ulcers or streaking or tenderness NEURO:  Oriented x3, cranial nerves 3-12 appear to be intact, no obvious focal weakness,gait within normal limits no abnormal reflexes or asymmetrical normal sensation to monofilament SKIN: No acute rashes normal turgor, color, no bruising or petechiae. PSYCH: Oriented, good eye contact, no obvious depression anxiety, cognition and judgment appear normal. LN: no cervical adenopathy  Wt Readings from Last 3 Encounters:  12/08/11 245 lb (111.131 kg)  04/28/11 243 lb (110.224 kg)  02/26/11 244 lb (110.678 kg)     Lab Results  Component Value Date   HGB 14.0 04/22/2010   GLUCOSE 115* 04/22/2010   CHOL 200 04/22/2010   TRIG 200.0* 04/22/2010   HDL 50.20 04/22/2010   LDLDIRECT 130.6 12/20/2007   LDLCALC 110* 04/22/2010   NA 140 04/22/2010   K 3.8 04/22/2010   CL 104 04/22/2010   CREATININE 0.8 04/22/2010   BUN 19 04/22/2010   CO2 29 04/22/2010       Assessment & Plan:   Tingling  Le  Uncertain cause of but normal exam  Doesn't sound typical of metabolic neuropathy but has  Hx of hyperglycemia  Ht  Due for monitoring lab check a1c  Ht seems controlled monitoring Mood stable on meds  HCM flu vaccine ok today  screening and monitoring labs  If labs ok Intensify lifestyle interventions. Fu if  persistent or progressive Obesity  Losing weight helpful

## 2011-12-08 NOTE — Patient Instructions (Signed)
Continue medication for blood pressure control. Can refill for 90 days because we're doing laboratory studies today Uncertain cause of your tingling but it does not appear to be a diabetic neuropathy at this time uncertain differential could be related to back and a pinch nerve at this time because your exam is good would monitor to healthy lifestyle, lose weight; followup if persistent or progressive Flu vaccine today Check into your insurance to see if the shingles shot is covered can get this in any time

## 2011-12-14 ENCOUNTER — Encounter: Payer: Self-pay | Admitting: Internal Medicine

## 2011-12-14 DIAGNOSIS — R200 Anesthesia of skin: Secondary | ICD-10-CM | POA: Insufficient documentation

## 2011-12-14 DIAGNOSIS — Z8639 Personal history of other endocrine, nutritional and metabolic disease: Secondary | ICD-10-CM | POA: Insufficient documentation

## 2012-06-15 ENCOUNTER — Other Ambulatory Visit: Payer: Self-pay | Admitting: Internal Medicine

## 2012-07-08 ENCOUNTER — Other Ambulatory Visit: Payer: Self-pay | Admitting: Internal Medicine

## 2012-08-22 ENCOUNTER — Other Ambulatory Visit: Payer: Self-pay | Admitting: Internal Medicine

## 2012-08-25 ENCOUNTER — Other Ambulatory Visit: Payer: Self-pay | Admitting: Internal Medicine

## 2012-09-25 ENCOUNTER — Other Ambulatory Visit: Payer: Self-pay | Admitting: Internal Medicine

## 2012-11-05 ENCOUNTER — Encounter: Payer: BC Managed Care – PPO | Admitting: Internal Medicine

## 2012-11-05 DIAGNOSIS — Z0289 Encounter for other administrative examinations: Secondary | ICD-10-CM

## 2012-11-05 NOTE — Progress Notes (Signed)
Document opened and reviewed for OV but NSsame day .  

## 2012-12-20 ENCOUNTER — Ambulatory Visit (INDEPENDENT_AMBULATORY_CARE_PROVIDER_SITE_OTHER): Payer: BC Managed Care – PPO | Admitting: Internal Medicine

## 2012-12-20 ENCOUNTER — Encounter: Payer: Self-pay | Admitting: Internal Medicine

## 2012-12-20 VITALS — BP 126/72 | HR 76 | Temp 97.4°F | Ht 63.75 in | Wt 241.0 lb

## 2012-12-20 DIAGNOSIS — Z23 Encounter for immunization: Secondary | ICD-10-CM

## 2012-12-20 DIAGNOSIS — Z79899 Other long term (current) drug therapy: Secondary | ICD-10-CM | POA: Insufficient documentation

## 2012-12-20 DIAGNOSIS — Z0000 Encounter for general adult medical examination without abnormal findings: Secondary | ICD-10-CM

## 2012-12-20 DIAGNOSIS — I10 Essential (primary) hypertension: Secondary | ICD-10-CM

## 2012-12-20 LAB — HM MAMMOGRAPHY

## 2012-12-20 LAB — LIPID PANEL: HDL: 43.4 mg/dL (ref >39.00)

## 2012-12-20 LAB — HEPATIC FUNCTION PANEL
AST: 29 U/L (ref 0–37)
Albumin: 3.9 g/dL (ref 3.5–5.2)
Alkaline Phosphatase: 63 U/L (ref 39–117)
Total Protein: 7.6 g/dL (ref 6.0–8.3)

## 2012-12-20 LAB — CBC WITH DIFFERENTIAL/PLATELET
Basophils Absolute: 0 10*3/uL (ref 0.0–0.1)
Basophils Relative: 0.3 % (ref 0.0–3.0)
Eosinophils Absolute: 0.3 10*3/uL (ref 0.0–0.7)
Eosinophils Relative: 3.7 % (ref 0.0–5.0)
HCT: 42 % (ref 36.0–46.0)
Hemoglobin: 14.1 g/dL (ref 12.0–15.0)
Lymphocytes Relative: 28.8 % (ref 12.0–46.0)
Lymphs Abs: 2.3 10*3/uL (ref 0.7–4.0)
MCHC: 33.7 g/dL (ref 30.0–36.0)
MCV: 86.3 fl (ref 78.0–100.0)
Neutro Abs: 4.6 10*3/uL (ref 1.4–7.7)
RBC: 4.86 Mil/uL (ref 3.87–5.11)
RDW: 14.1 % (ref 11.5–14.6)
WBC: 7.9 10*3/uL (ref 4.5–10.5)

## 2012-12-20 LAB — BASIC METABOLIC PANEL
CO2: 31 mEq/L (ref 19–32)
Glucose, Bld: 130 mg/dL — ABNORMAL HIGH (ref 70–99)
Potassium: 4.6 mEq/L (ref 3.5–5.1)
Sodium: 141 mEq/L (ref 135–145)

## 2012-12-20 LAB — LDL CHOLESTEROL, DIRECT: Direct LDL: 153.4 mg/dL

## 2012-12-20 LAB — TSH: TSH: 2.53 u[IU]/mL (ref 0.35–5.50)

## 2012-12-20 MED ORDER — METOPROLOL TARTRATE 50 MG PO TABS: ORAL_TABLET | ORAL | Status: DC

## 2012-12-20 MED ORDER — TRIAMTERENE-HCTZ 37.5-25 MG PO TABS: ORAL_TABLET | ORAL | Status: DC

## 2012-12-20 MED ORDER — FLUOXETINE HCL 10 MG PO CAPS: ORAL_CAPSULE | ORAL | Status: DC

## 2012-12-20 NOTE — Patient Instructions (Signed)
150 minutes of exercise weeks  ,  Lose weight  To healthy levels. bmi below 30 is best   Avoid trans fats and processed foods;  Increase fresh fruits and veges to 5 servings per day. And avoid sweet beverages  Including tea and juice. Will notify you  of labs when available.  Recheck cpx in a year .

## 2012-12-20 NOTE — Progress Notes (Signed)
Chief Complaint  Patient presents with  . Annual Exam    meds  . Hypertension    HPI: Patient comes in today for Preventive Health Care visit   Had mammogram today   Had emergency appendectomy sept 5  laparoscopic no complications  .  Doing better.  bp controlled  Will need me drefill   prozac helpful. To continue  VV about the same but  Needs ot use stocking   For at least 3 months for this.   utd on colonoscopy as per  patient  201 o0  ROS:  GEN/ HEENT: No fever, significant weight changes sweats headaches vision problems hearing changes, CV/ PULM; No chest pain shortness of breath cough, syncope,edema  change in exercise tolerance. GI /GU: No adominal pain, vomiting, change in bowel habits. No blood in the stool. No significant GU symptoms. SKIN/HEME: ,no acute skin rashes suspicious lesions or bleeding. No lymphadenopathy, nodules, masses.  NEURO/ PSYCH:  No neurologic signs such as weakness numbness. No depression anxiety. IMM/ Allergy: No unusual infections.  Allergy .   REST of 12 system review negative except as per HPI   Past Medical History  Diagnosis Date  . 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 Other specified disorder of skin 12/25/2008  . 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 NECK PAIN 11/22/2007  . TOBACCO USE, QUIT 12/25/2008  . ASTHMA 11/25/2006  . HYPERTENSION 11/22/2007    echo card 2004 nl lv function   . Ganglion cyst   . Cough due to ACE inhibitor 04/28/2011    Family History  Problem Relation Age of Onset  . Alzheimer's disease Mother   . Hypertension Mother   . Diabetes Other     1st degree relative  . Diabetes Father     History   Social History  . Marital Status: Single    Spouse Name: N/A    Number of Children: N/A  . Years of Education: N/A   Occupational History  . Hairdresser    Social History Main Topics  . Smoking status: Former Games developer  . Smokeless tobacco: Never Used  . Alcohol Use: No  . Drug  Use: No  . Sexual Activity: None   Other Topics Concern  . 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    Former tobacco  No tad     Divorced.       Brother temp , son back in for temporarily.  People      Dog     Outpatient Encounter Prescriptions as of 12/20/2012  Medication Sig Dispense Refill  . aspirin 81 MG tablet Take 81 mg by mouth daily.        Marland Kitchen FLUoxetine (PROZAC) 10 MG capsule TAKE ONE CAPSULE BY MOUTH ONCE DAILY  90 capsule  3  . metoprolol (LOPRESSOR) 50 MG tablet TAKE ONE-HALF TABLET BY MOUTH TWICE DAILY  90 tablet  1  . triamterene-hydrochlorothiazide (MAXZIDE-25) 37.5-25 MG per tablet TAKE ONE TABLET BY MOUTH EVERY DAY  90 tablet  3  . [DISCONTINUED] FLUoxetine (PROZAC) 10 MG capsule TAKE ONE CAPSULE BY MOUTH ONCE DAILY  90 capsule  0  . [DISCONTINUED] metoprolol (LOPRESSOR) 50 MG tablet TAKE ONE-HALF TABLET BY MOUTH TWICE DAILY  90 tablet  1  . [DISCONTINUED] triamterene-hydrochlorothiazide (MAXZIDE-25) 37.5-25 MG per tablet TAKE ONE TABLET BY MOUTH EVERY  DAY  90 tablet  1   No facility-administered encounter medications on file as of 12/20/2012.    EXAM:  BP 126/72  Pulse 76  Temp(Src) 97.4 F (36.3 C) (Oral)  Ht 5' 3.75" (1.619 m)  Wt 241 lb (109.317 kg)  BMI 41.71 kg/m2  SpO2 96%  Body mass index is 41.71 kg/(m^2).  Physical Exam: Vital signs reviewed [id number] is a well-developed well-nourished alert cooperative   female who appears her stated age in no acute distress.  HEENT: normocephalic atraumatic , Eyes: PERRL EOM's full, conjunctiva clear, Nares: paten,t no deformity discharge or tenderness., Ears: no deformity EAC's clear TMs with normal landmarks. Mouth: clear OP, no lesions, edema.  Moist mucous membranes. Dentition in adequate repair. NECK: supple without masses, thyromegaly or bruits. CHEST/PULM:  Clear to auscultation and percussion breath sounds  equal no wheeze , rales or rhonchi. No chest wall deformities or tenderness. Breast: normal by inspection . No dimpling, discharge, masses, tenderness or discharge . CV: PMI is nondisplaced, S1 S2 no gallops, murmurs, rubs. Peripheral pulses are full without delay.No JVD .  ABDOMEN: Bowel sounds normal nontender  No guard or rebound, no hepato splenomegal no CVA tenderness.  No hernia.wellhealed  Lap scars no hernia  Extremtities:  No clubbing cyanosis or edema, no acute joint swelling or redness no focal atrophy VV noted no ulcers  NEURO:  Oriented x3, cranial nerves 3-12 appear to be intact, no obvious focal weakness,gait within normal limits no abnormal reflexes or asymmetrical SKIN: No acute rashes normal turgor, color, no bruising or petechiae. PSYCH: Oriented, good eye contact, no obvious depression anxiety, cognition and judgment appear normal. LN: no cervical axillary inguinal adenopathy  Lab Results  Component Value Date   WBC 7.9 12/20/2012   HGB 14.1 12/20/2012   HCT 42.0 12/20/2012   PLT 216.0 12/20/2012   GLUCOSE 130* 12/20/2012   CHOL 228* 12/20/2012   TRIG 275.0* 12/20/2012   HDL 43.40 12/20/2012   LDLDIRECT 153.4 12/20/2012   LDLCALC 110* 04/22/2010   ALT 29 12/20/2012   AST 29 12/20/2012   NA 141 12/20/2012   K 4.6 12/20/2012   CL 102 12/20/2012   CREATININE 0.7 12/20/2012   BUN 20 12/20/2012   CO2 31 12/20/2012   TSH 2.53 12/20/2012    ASSESSMENT AND PLAN:  Discussed the following assessment and plan:  Visit for preventive health examination - Plan: HM MAMMOGRAPHY, HM PAP SMEAR, Flu Vaccine QUAD 36+ mos PF IM (Fluarix), Tdap vaccine greater than or equal to 7yo IM, Varicella-zoster vaccine subcutaneous, Basic metabolic panel, CBC with Differential, Hepatic function panel, TSH, Lipid panel  HYPERTENSION - controlled  - Plan: HM MAMMOGRAPHY, HM PAP SMEAR, Flu Vaccine QUAD 36+ mos PF IM (Fluarix), Tdap vaccine greater than or equal to 7yo IM, Varicella-zoster  vaccine subcutaneous, Basic metabolic panel  Need for prophylactic vaccination and inoculation against influenza - Plan: HM MAMMOGRAPHY, HM PAP SMEAR, Flu Vaccine QUAD 36+ mos PF IM (Fluarix), Tdap vaccine greater than or equal to 7yo IM, Varicella-zoster vaccine subcutaneous, Basic metabolic panel, CBC with Differential, Hepatic function panel, TSH, Lipid panel  Need for prophylactic vaccination with combined diphtheria-tetanus-pertussis (DTP) vaccine - Plan: HM MAMMOGRAPHY, HM PAP SMEAR, Flu Vaccine QUAD 36+ mos PF IM (Fluarix), Tdap vaccine greater than or equal to 7yo IM, Varicella-zoster vaccine subcutaneous, Basic metabolic panel, CBC with Differential, Hepatic function panel, TSH, Lipid panel  Need for prophylactic vaccination and inoculation against other viral diseases(V04.89) - Plan: HM MAMMOGRAPHY, HM PAP SMEAR, Flu  Vaccine QUAD 36+ mos PF IM (Fluarix), Tdap vaccine greater than or equal to 7yo IM, Varicella-zoster vaccine subcutaneous, Basic metabolic panel, CBC with Differential, Hepatic function panel, TSH, Lipid panel  Counseled regarding healthy nutrition, exercise, sleep, injury prevention, calcium vit d and healthy weight . Advise weight loss Patient Care Team: Madelin Headings, MD as PCP - General Janifer Adie, MD (Obstetrics and Gynecology) Patient Instructions  150 minutes of exercise weeks  ,  Lose weight  To healthy levels. bmi below 30 is best   Avoid trans fats and processed foods;  Increase fresh fruits and veges to 5 servings per day. And avoid sweet beverages  Including tea and juice. Will notify you  of labs when available.  Recheck cpx in a year .     Neta Mends. Panosh M.D. Health Maintenance  Topic Date Due  . Influenza Vaccine  10/01/2013  . Mammogram  12/21/2014  . Colonoscopy  12/05/2018  . Tetanus/tdap  12/21/2022  . Zostavax  Completed   Health Maintenance Review }

## 2012-12-27 ENCOUNTER — Other Ambulatory Visit: Payer: Self-pay | Admitting: Radiology

## 2012-12-28 ENCOUNTER — Other Ambulatory Visit: Payer: Self-pay | Admitting: Radiology

## 2012-12-28 DIAGNOSIS — C50911 Malignant neoplasm of unspecified site of right female breast: Secondary | ICD-10-CM

## 2013-01-04 ENCOUNTER — Ambulatory Visit
Admission: RE | Admit: 2013-01-04 | Discharge: 2013-01-04 | Disposition: A | Discharge status: Home / Self Care | Payer: BC Managed Care – PPO | Source: Ambulatory Visit | Attending: Radiology | Admitting: Radiology

## 2013-01-04 DIAGNOSIS — C50911 Malignant neoplasm of unspecified site of right female breast: Secondary | ICD-10-CM

## 2013-01-04 MED ORDER — GADOBENATE DIMEGLUMINE 529 MG/ML IV SOLN
20.0000 mL | Freq: Once | INTRAVENOUS | Status: AC | PRN
  Administered 2013-01-04: 20 mL via INTRAVENOUS

## 2013-01-05 ENCOUNTER — Other Ambulatory Visit: Payer: Self-pay | Admitting: Surgery

## 2013-01-05 DIAGNOSIS — R928 Other abnormal and inconclusive findings on diagnostic imaging of breast: Secondary | ICD-10-CM

## 2013-01-07 ENCOUNTER — Other Ambulatory Visit: Payer: Self-pay | Admitting: Surgery

## 2013-01-07 DIAGNOSIS — R928 Other abnormal and inconclusive findings on diagnostic imaging of breast: Secondary | ICD-10-CM

## 2013-01-14 ENCOUNTER — Ambulatory Visit
Admission: RE | Admit: 2013-01-14 | Discharge: 2013-01-14 | Disposition: A | Discharge status: Home / Self Care | Payer: BC Managed Care – PPO | Source: Ambulatory Visit | Attending: Surgery | Admitting: Surgery

## 2013-01-14 DIAGNOSIS — R928 Other abnormal and inconclusive findings on diagnostic imaging of breast: Secondary | ICD-10-CM

## 2013-01-14 MED ORDER — GADOBENATE DIMEGLUMINE 529 MG/ML IV SOLN
18.0000 mL | Freq: Once | INTRAVENOUS | Status: AC | PRN
  Administered 2013-01-14: 18 mL via INTRAVENOUS

## 2013-02-01 ENCOUNTER — Ambulatory Visit (INDEPENDENT_AMBULATORY_CARE_PROVIDER_SITE_OTHER): Payer: BC Managed Care – PPO | Admitting: General Surgery

## 2013-02-01 ENCOUNTER — Encounter (INDEPENDENT_AMBULATORY_CARE_PROVIDER_SITE_OTHER): Payer: Self-pay | Admitting: General Surgery

## 2013-02-01 ENCOUNTER — Encounter (INDEPENDENT_AMBULATORY_CARE_PROVIDER_SITE_OTHER): Payer: Self-pay

## 2013-02-01 VITALS — BP 140/80 | HR 64 | Temp 96.9°F | Resp 20 | Ht 63.0 in | Wt 238.0 lb

## 2013-02-01 DIAGNOSIS — C50919 Malignant neoplasm of unspecified site of unspecified female breast: Secondary | ICD-10-CM

## 2013-02-01 DIAGNOSIS — C50911 Malignant neoplasm of unspecified site of right female breast: Secondary | ICD-10-CM | POA: Insufficient documentation

## 2013-02-01 NOTE — Patient Instructions (Signed)
Meet with Dr. Odis Luster tomorrow and let me know what you decide

## 2013-02-02 ENCOUNTER — Other Ambulatory Visit (INDEPENDENT_AMBULATORY_CARE_PROVIDER_SITE_OTHER): Payer: Self-pay | Admitting: General Surgery

## 2013-02-02 DIAGNOSIS — C50911 Malignant neoplasm of unspecified site of right female breast: Secondary | ICD-10-CM

## 2013-02-04 NOTE — Progress Notes (Signed)
Patient ID: [name], female   DOB: 09/21/1950, 62 y.o.   MRN: [id number]  Chief Complaint  Patient presents with  . Breast Cancer    HPI [name] is a 62 y.o. female.  We're asked to see the patient in consultation by Dr. Gilman Buttner to evaluate her for right breast cancer. The patient is a 62 year old white female who recently went for a routine screening mammogram. At that time she was found to have a small area in the right breast that was abnormal. As was biopsied and came back as breast cancer. The MRI unfortunately showed a much larger area of involvement as well as multiple areas of involvement. She is also HER-2 positive and will require chemotherapy. She denies any breast pain. She denies any discharge or nipple.  HPI  Past Medical History  Diagnosis Date  . 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 Other specified disorder of skin 12/25/2008  . 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 NECK PAIN 11/22/2007  . TOBACCO USE, QUIT 12/25/2008  . ASTHMA 11/25/2006  . HYPERTENSION 11/22/2007    echo card 2004 nl lv function   . Ganglion cyst   . Cough due to ACE inhibitor 04/28/2011    Past Surgical History  Procedure Laterality Date  . Abdominal hysterectomy  1989  . Laparoscopic appendectomy  11/05/12   . Wrist surgery  2001    Family History  Problem Relation Age of Onset  . Alzheimer'Jackson disease Mother   . Hypertension Mother   . Diabetes Other     1st degree relative  . Diabetes Father   . Bladder Cancer Brother     Social History History  Substance Use Topics  . Smoking status: Former Smoker    Quit date: 03/04/1987  . Smokeless tobacco: Never Used  . Alcohol Use: No    Allergies  Allergen Reactions  . Ace Inhibitors Cough  . Codeine Itching and Nausea Only    Current Outpatient Prescriptions  Medication Sig Dispense Refill  . aspirin 81 MG tablet Take 81 mg by mouth daily.        Marland Kitchen FLUoxetine (PROZAC) 10 MG capsule TAKE  ONE CAPSULE BY MOUTH ONCE DAILY  90 capsule  3  . metoprolol (LOPRESSOR) 50 MG tablet TAKE ONE-HALF TABLET BY MOUTH TWICE DAILY  90 tablet  1  . triamterene-hydrochlorothiazide (MAXZIDE-25) 37.5-25 MG per tablet TAKE ONE TABLET BY MOUTH EVERY DAY  90 tablet  3   No current facility-administered medications for this visit.    Review of Systems Review of Systems  Constitutional: Negative.   HENT: Negative.   Eyes: Negative.   Respiratory: Negative.   Cardiovascular: Negative.   Gastrointestinal: Negative.   Endocrine: Negative.   Genitourinary: Negative.   Musculoskeletal: Negative.   Skin: Negative.   Allergic/Immunologic: Negative.   Neurological: Negative.   Hematological: Negative.   Psychiatric/Behavioral: Negative.     Blood pressure 140/80, pulse 64, temperature 96.9 F (36.1 C), temperature source Temporal, resp. rate 20, height 5\' 3"  (1.6 m), weight 238 lb (107.956 kg).  Physical Exam Physical Exam  Constitutional: She is oriented to person, place, and time. She appears well-developed and well-nourished.  HENT:  Head: Normocephalic and atraumatic.  Eyes: Conjunctivae and EOM are normal. Pupils are equal, round, and reactive to light.  Neck: Normal range of motion. Neck supple.  Cardiovascular: Normal rate, regular rhythm and normal heart sounds.   Pulmonary/Chest: Effort normal and breath sounds normal.  There  is no palpable mass in either breast. There is no palpable axillary, supraclavicular, or cervical lymphadenopathy  Abdominal: Soft. Bowel sounds are normal. She exhibits no mass. There is no tenderness.  Musculoskeletal: Normal range of motion.  Lymphadenopathy:    She has no cervical adenopathy.  Neurological: She is alert and oriented to person, place, and time.  Skin: Skin is warm and dry.  Psychiatric: She has a normal mood and affect. Her behavior is normal.    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 Because of this I think the most appropriate surgical treatment for her would be a mastectomy with sentinel node mapping. I have discussed with her in detail the risks and benefits of the operation to this as well as some of the technical aspects and she understands and wishes to proceed. She actually favors bilateral mastectomies with reconstruction. She will be meeting with Dr. Odis Luster to discuss the options. Because of her need for chemotherapy she will also need a Port-A-Cath placed. I've also discussed with her the risks and benefits as well as some of the technical aspects of placing a port and she understands and would like to proceed with that as well     Plan    Plan for bilateral mastectomy with right breast sentinel node biopsy and Port-A-Cath placement with reconstruction by Dr. Elmarie Shiley Jackson,[name] 02/04/2013, 1:12 PM

## 2013-03-02 ENCOUNTER — Other Ambulatory Visit: Payer: Self-pay | Admitting: Plastic Surgery

## 2013-03-07 ENCOUNTER — Encounter (HOSPITAL_COMMUNITY)
Admission: RE | Admit: 2013-03-07 | Discharge: 2013-03-07 | Disposition: A | Discharge status: Home / Self Care | Payer: BC Managed Care – PPO | Source: Ambulatory Visit | Attending: General Surgery | Admitting: General Surgery

## 2013-03-07 ENCOUNTER — Encounter (HOSPITAL_COMMUNITY)
Admission: RE | Admit: 2013-03-07 | Discharge: 2013-03-07 | Disposition: A | Discharge status: Home / Self Care | Payer: BC Managed Care – PPO | Source: Ambulatory Visit | Attending: Anesthesiology | Admitting: Anesthesiology

## 2013-03-07 ENCOUNTER — Encounter (HOSPITAL_COMMUNITY): Payer: Self-pay

## 2013-03-07 HISTORY — DX: Major depressive disorder, single episode, unspecified: F32.9

## 2013-03-07 HISTORY — DX: Unspecified osteoarthritis, unspecified site: M19.90

## 2013-03-07 HISTORY — DX: Other specified postprocedural states: Z98.890

## 2013-03-07 HISTORY — DX: Anxiety disorder, unspecified: F41.9

## 2013-03-07 HISTORY — DX: Nausea with vomiting, unspecified: R11.2

## 2013-03-07 HISTORY — DX: Depression, unspecified: F32.A

## 2013-03-07 HISTORY — DX: Malignant (primary) neoplasm, unspecified: C80.1

## 2013-03-07 LAB — CBC
HCT: 44.9 % (ref 36.0–46.0)
HEMOGLOBIN: 15.5 g/dL — AB (ref 12.0–15.0)
MCH: 30.3 pg (ref 26.0–34.0)
MCHC: 34.5 g/dL (ref 30.0–36.0)
MCV: 87.7 fL (ref 78.0–100.0)
Platelets: 176 10*3/uL (ref 150–400)
RBC: 5.12 MIL/uL — ABNORMAL HIGH (ref 3.87–5.11)
RDW: 13.1 % (ref 11.5–15.5)
WBC: 7.3 10*3/uL (ref 4.0–10.5)

## 2013-03-07 LAB — BASIC METABOLIC PANEL
BUN: 16 mg/dL (ref 6–23)
CALCIUM: 9.8 mg/dL (ref 8.4–10.5)
CO2: 31 mEq/L (ref 19–32)
CREATININE: 0.77 mg/dL (ref 0.50–1.10)
Chloride: 100 mEq/L (ref 96–112)
GFR calc Af Amer: >90 mL/min (ref >90)
GFR calc non Af Amer: 88 mL/min — ABNORMAL LOW (ref >90)
Glucose, Bld: 107 mg/dL — ABNORMAL HIGH (ref 70–99)
Potassium: 4.2 mEq/L (ref 3.7–5.3)
Sodium: 141 mEq/L (ref 137–147)

## 2013-03-07 NOTE — Progress Notes (Signed)
03/07/13 1110  OBSTRUCTIVE SLEEP APNEA  Have you ever been diagnosed with sleep apnea through a sleep study? No  Do you snore loudly (loud enough to be heard through closed doors)?  1  Do you often feel tired, fatigued, or sleepy during the daytime? 0  Has anyone observed you stop breathing during your sleep? 0  Do you have, or are you being treated for high blood pressure? 1  BMI more than 35 kg/m2? 1  Age over 63 years old? 1  Neck circumference greater than 40 cm/18 inches? 0  Gender: 0  Obstructive Sleep Apnea Score 4  Score 4 or greater  Results sent to PCP   

## 2013-03-07 NOTE — Pre-Procedure Instructions (Signed)
SUANN KLIER  03/07/2013   Your procedure is scheduled on:  Jan. 8 @0730   Report to [place]  2 * 3 at 0530 AM.  Call this number if you have problems the morning of surgery: [phone]   Remember:   Do not eat food or drink liquids after midnight.   Take these medicines the morning of surgery with A SIP OF WATER:  Fluoxetine (Prozac) and Metoprolol (Lopressor)  Stop taking Aspirin, Coumadin, Plavix, Effient, Herbal Medications, Vitamins, Ibuprofen, Advil, Aleve, Naproxen or any medications containing Aspirin.     Do not wear jewelry, make-up or nail polish.  Do not wear lotions, powders, or perfumes. You may wear deodorant.  Do not shave 48 hours prior to surgery.  Do not bring valuables to the hospital.  [facility] is not responsible                  for any belongings or valuables.               Contacts, dentures or bridgework may not be worn into surgery.  Leave suitcase in the car. After surgery it may be brought to your room.  For patients admitted to the hospital, discharge time is determined by your                treatment team.               Patients discharged the day of surgery will not be allowed to drive  home.    Special Instructions: Shower using CHG 2 nights before surgery and the night before surgery.  If you shower the day of surgery use CHG.  Use special wash - you have one bottle of CHG for all showers.  You should use approximately 1/3 of the bottle for each shower.   Please read over the following fact sheets that you were given: Pain Booklet, Coughing and Deep Breathing and Surgical Site Infection Prevention

## 2013-03-07 NOTE — Progress Notes (Signed)
03/07/13 1110  OBSTRUCTIVE SLEEP APNEA  Have you ever been diagnosed with sleep apnea through a sleep study? No  Do you snore loudly (loud enough to be heard through closed doors)?  1  Do you often feel tired, fatigued, or sleepy during the daytime? 0  Has anyone observed you stop breathing during your sleep? 0  Do you have, or are you being treated for high blood pressure? 1  BMI more than 35 kg/m2? 1  Age over 63 years old? 1  Neck circumference greater than 40 cm/18 inches? 0  Gender: 0  Obstructive Sleep Apnea Score 4  Score 4 or greater  Results sent to PCP

## 2013-03-07 NOTE — Progress Notes (Signed)
Denies seeing a cardiologist  Denies having a recent EKG, CXR, stress test, or card cath. PCP is Dr Zack Seal Pt states that she had an echo in Nov 2014 at [facility].  Request to be faxed for results.  Pt states that she spoke with Dr Marlou Starks and he wants to place a Port-a-cath also the day of surgery.  Dr Marlou Starks returned call and port-a-cath added to the consent as ordered.

## 2013-03-08 ENCOUNTER — Encounter (HOSPITAL_COMMUNITY): Payer: Self-pay

## 2013-03-09 MED ORDER — CEFAZOLIN SODIUM-DEXTROSE 2-3 GM-% IV SOLR
2.0000 g | INTRAVENOUS | Status: AC
  Administered 2013-03-10 (×2): 2 g via INTRAVENOUS
  Filled 2013-03-09: qty 50

## 2013-03-10 ENCOUNTER — Encounter (HOSPITAL_COMMUNITY): Payer: BC Managed Care – PPO | Admitting: Anesthesiology

## 2013-03-10 ENCOUNTER — Ambulatory Visit (HOSPITAL_COMMUNITY): Payer: BC Managed Care – PPO

## 2013-03-10 ENCOUNTER — Inpatient Hospital Stay (HOSPITAL_COMMUNITY)
Admission: RE | Admit: 2013-03-10 | Discharge: 2013-03-11 | DRG: 581 | Disposition: A | Discharge status: Home / Self Care | Payer: BC Managed Care – PPO | Source: Ambulatory Visit | Attending: Plastic Surgery | Admitting: Plastic Surgery

## 2013-03-10 ENCOUNTER — Encounter (HOSPITAL_COMMUNITY)
Admission: RE | Admit: 2013-03-10 | Discharge: 2013-03-10 | Disposition: A | Discharge status: Home / Self Care | Payer: BC Managed Care – PPO | Source: Ambulatory Visit | Attending: General Surgery | Admitting: General Surgery

## 2013-03-10 ENCOUNTER — Encounter (HOSPITAL_COMMUNITY): Payer: Self-pay | Admitting: *Deleted

## 2013-03-10 ENCOUNTER — Encounter (HOSPITAL_COMMUNITY)
Admission: RE | Disposition: A | Discharge status: Home / Self Care | Payer: Self-pay | Source: Ambulatory Visit | Attending: Plastic Surgery

## 2013-03-10 ENCOUNTER — Ambulatory Visit (HOSPITAL_COMMUNITY): Payer: BC Managed Care – PPO | Admitting: Anesthesiology

## 2013-03-10 DIAGNOSIS — R920 Mammographic microcalcification found on diagnostic imaging of breast: Secondary | ICD-10-CM

## 2013-03-10 DIAGNOSIS — C50911 Malignant neoplasm of unspecified site of right female breast: Secondary | ICD-10-CM

## 2013-03-10 DIAGNOSIS — Z853 Personal history of malignant neoplasm of breast: Secondary | ICD-10-CM

## 2013-03-10 DIAGNOSIS — J45909 Unspecified asthma, uncomplicated: Secondary | ICD-10-CM | POA: Diagnosis present

## 2013-03-10 DIAGNOSIS — I10 Essential (primary) hypertension: Secondary | ICD-10-CM | POA: Diagnosis present

## 2013-03-10 DIAGNOSIS — F3289 Other specified depressive episodes: Secondary | ICD-10-CM | POA: Diagnosis present

## 2013-03-10 DIAGNOSIS — C50919 Malignant neoplasm of unspecified site of unspecified female breast: Principal | ICD-10-CM | POA: Diagnosis present

## 2013-03-10 DIAGNOSIS — Z87891 Personal history of nicotine dependence: Secondary | ICD-10-CM

## 2013-03-10 DIAGNOSIS — F329 Major depressive disorder, single episode, unspecified: Secondary | ICD-10-CM | POA: Diagnosis present

## 2013-03-10 HISTORY — PX: MASTECTOMY W/ SENTINEL NODE BIOPSY: SHX2001

## 2013-03-10 HISTORY — PX: BREAST RECONSTRUCTION WITH PLACEMENT OF TISSUE EXPANDER AND FLEX HD (ACELLULAR HYDRATED DERMIS): SHX6295

## 2013-03-10 HISTORY — PX: PORTACATH PLACEMENT: SHX2246

## 2013-03-10 LAB — CREATININE, SERUM
Creatinine, Ser: 0.64 mg/dL (ref 0.50–1.10)
GFR calc Af Amer: >90 mL/min (ref >90)
GFR calc non Af Amer: >90 mL/min (ref >90)

## 2013-03-10 LAB — CBC
HCT: 38.9 % (ref 36.0–46.0)
Hemoglobin: 13.3 g/dL (ref 12.0–15.0)
MCH: 29.6 pg (ref 26.0–34.0)
MCHC: 34.2 g/dL (ref 30.0–36.0)
MCV: 86.6 fL (ref 78.0–100.0)
PLATELETS: 140 10*3/uL — AB (ref 150–400)
RBC: 4.49 MIL/uL (ref 3.87–5.11)
RDW: 13.2 % (ref 11.5–15.5)
WBC: 10.9 10*3/uL — ABNORMAL HIGH (ref 4.0–10.5)

## 2013-03-10 SURGERY — MASTECTOMY WITH SENTINEL LYMPH NODE BIOPSY
Anesthesia: General | Site: Chest | Laterality: Left

## 2013-03-10 MED ORDER — CHLORHEXIDINE GLUCONATE 4 % EX LIQD: 1.0000 "application " | Freq: Once | CUTANEOUS | Status: DC

## 2013-03-10 MED ORDER — SODIUM CHLORIDE 0.9 % IV SOLN
INTRAVENOUS | Status: AC
  Filled 2013-03-10: qty 1000

## 2013-03-10 MED ORDER — BUPIVACAINE HCL (PF) 0.25 % IJ SOLN
INTRAMUSCULAR | Status: AC
  Filled 2013-03-10: qty 10

## 2013-03-10 MED ORDER — METHYLENE BLUE 1 % INJ SOLN
INTRAMUSCULAR | Status: AC
  Filled 2013-03-10: qty 10

## 2013-03-10 MED ORDER — PROMETHAZINE HCL 25 MG/ML IJ SOLN: 6.2500 mg | INTRAMUSCULAR | Status: DC | PRN

## 2013-03-10 MED ORDER — CEFAZOLIN SODIUM-DEXTROSE 2-3 GM-% IV SOLR
2.0000 g | INTRAVENOUS | Status: DC
  Filled 2013-03-10: qty 50

## 2013-03-10 MED ORDER — HYDROMORPHONE HCL PF 1 MG/ML IJ SOLN
0.2500 mg | INTRAMUSCULAR | Status: DC | PRN
  Administered 2013-03-10 (×2): 0.5 mg via INTRAVENOUS

## 2013-03-10 MED ORDER — HYDROMORPHONE HCL PF 1 MG/ML IJ SOLN
INTRAMUSCULAR | Status: AC
  Filled 2013-03-10: qty 1

## 2013-03-10 MED ORDER — HEPARIN SODIUM (PORCINE) 5000 UNIT/ML IJ SOLN
5000.0000 [IU] | Freq: Three times a day (TID) | INTRAMUSCULAR | Status: DC
  Administered 2013-03-11: 5000 [IU] via SUBCUTANEOUS
  Filled 2013-03-10 (×4): qty 1

## 2013-03-10 MED ORDER — ADULT MULTIVITAMIN W/MINERALS CH
1.0000 | ORAL_TABLET | Freq: Every day | ORAL | Status: DC
  Administered 2013-03-11: 1 via ORAL
  Filled 2013-03-10: qty 1

## 2013-03-10 MED ORDER — FLUOXETINE HCL 10 MG PO CAPS
10.0000 mg | ORAL_CAPSULE | Freq: Every day | ORAL | Status: DC
  Administered 2013-03-11: 10 mg via ORAL
  Filled 2013-03-10: qty 1

## 2013-03-10 MED ORDER — ONDANSETRON HCL 4 MG PO TABS: 4.0000 mg | ORAL_TABLET | Freq: Four times a day (QID) | ORAL | Status: DC | PRN

## 2013-03-10 MED ORDER — VECURONIUM BROMIDE 10 MG IV SOLR
INTRAVENOUS | Status: DC | PRN
  Administered 2013-03-10 (×2): 4 mg via INTRAVENOUS
  Administered 2013-03-10 (×4): 2 mg via INTRAVENOUS
  Administered 2013-03-10: 4 mg via INTRAVENOUS
  Administered 2013-03-10: 2 mg via INTRAVENOUS

## 2013-03-10 MED ORDER — HYDROMORPHONE 0.3 MG/ML IV SOLN
INTRAVENOUS | Status: DC
  Administered 2013-03-10: 16:00:00 via INTRAVENOUS

## 2013-03-10 MED ORDER — BUPIVACAINE HCL (PF) 0.25 % IJ SOLN
INTRAMUSCULAR | Status: DC | PRN
  Administered 2013-03-10: 8 mL

## 2013-03-10 MED ORDER — SODIUM CHLORIDE 0.9 % IJ SOLN
INTRAMUSCULAR | Status: AC
  Filled 2013-03-10: qty 10

## 2013-03-10 MED ORDER — TECHNETIUM TC 99M SULFUR COLLOID FILTERED
1.0000 | Freq: Once | INTRAVENOUS | Status: AC | PRN
  Administered 2013-03-10: 1 via INTRADERMAL

## 2013-03-10 MED ORDER — OXYCODONE HCL 5 MG PO TABS: 5.0000 mg | ORAL_TABLET | Freq: Once | ORAL | Status: DC | PRN

## 2013-03-10 MED ORDER — DEXTROSE-NACL 5-0.45 % IV SOLN
INTRAVENOUS | Status: DC
  Administered 2013-03-10: 18:00:00 via INTRAVENOUS

## 2013-03-10 MED ORDER — MIDAZOLAM HCL 5 MG/5ML IJ SOLN
INTRAMUSCULAR | Status: DC | PRN
  Administered 2013-03-10: 2 mg via INTRAVENOUS

## 2013-03-10 MED ORDER — HEPARIN SODIUM (PORCINE) 5000 UNIT/ML IJ SOLN
5000.0000 [IU] | Freq: Once | INTRAMUSCULAR | Status: AC
  Administered 2013-03-10: 5000 [IU] via SUBCUTANEOUS

## 2013-03-10 MED ORDER — SODIUM CHLORIDE 0.9 % IJ SOLN
INTRAMUSCULAR | Status: DC | PRN
  Administered 2013-03-10: 09:00:00 via INTRAMUSCULAR

## 2013-03-10 MED ORDER — ROCURONIUM BROMIDE 100 MG/10ML IV SOLN
INTRAVENOUS | Status: DC | PRN
  Administered 2013-03-10: 50 mg via INTRAVENOUS
  Administered 2013-03-10: 10 mg via INTRAVENOUS

## 2013-03-10 MED ORDER — HEPARIN SODIUM (PORCINE) 5000 UNIT/ML IJ SOLN
INTRAMUSCULAR | Status: AC
  Filled 2013-03-10: qty 1

## 2013-03-10 MED ORDER — ONDANSETRON HCL 4 MG/2ML IJ SOLN
INTRAMUSCULAR | Status: DC | PRN
  Administered 2013-03-10: 4 mg via INTRAVENOUS

## 2013-03-10 MED ORDER — EPHEDRINE SULFATE 50 MG/ML IJ SOLN
INTRAMUSCULAR | Status: DC | PRN
  Administered 2013-03-10 (×4): 10 mg via INTRAVENOUS

## 2013-03-10 MED ORDER — DIPHENHYDRAMINE HCL 12.5 MG/5ML PO ELIX: 12.5000 mg | ORAL_SOLUTION | Freq: Four times a day (QID) | ORAL | Status: DC | PRN

## 2013-03-10 MED ORDER — CEFAZOLIN SODIUM 1-5 GM-% IV SOLN
1.0000 g | Freq: Four times a day (QID) | INTRAVENOUS | Status: DC
Start: 2013-03-10 — End: 2013-03-11
  Administered 2013-03-10 – 2013-03-11 (×4): 1 g via INTRAVENOUS
  Filled 2013-03-10 (×5): qty 50

## 2013-03-10 MED ORDER — OXYCODONE HCL 5 MG/5ML PO SOLN: 5.0000 mg | Freq: Once | ORAL | Status: DC | PRN

## 2013-03-10 MED ORDER — NEOSTIGMINE METHYLSULFATE 1 MG/ML IJ SOLN
INTRAMUSCULAR | Status: DC | PRN
  Administered 2013-03-10: 5 mg via INTRAVENOUS

## 2013-03-10 MED ORDER — DOCUSATE SODIUM 100 MG PO CAPS
100.0000 mg | ORAL_CAPSULE | Freq: Every day | ORAL | Status: DC
  Administered 2013-03-10 – 2013-03-11 (×2): 100 mg via ORAL
  Filled 2013-03-10 (×2): qty 1

## 2013-03-10 MED ORDER — ALBUMIN HUMAN 5 % IV SOLN
INTRAVENOUS | Status: DC | PRN
  Administered 2013-03-10: 10:00:00 via INTRAVENOUS

## 2013-03-10 MED ORDER — LACTATED RINGERS IV SOLN
INTRAVENOUS | Status: DC | PRN
  Administered 2013-03-10 (×4): via INTRAVENOUS

## 2013-03-10 MED ORDER — ACETAMINOPHEN 10 MG/ML IV SOLN
INTRAVENOUS | Status: AC
  Administered 2013-03-10: 1000 mg via INTRAVENOUS
  Filled 2013-03-10: qty 100

## 2013-03-10 MED ORDER — MULTI-VITAMIN/MINERALS PO TABS: 1.0000 | ORAL_TABLET | Freq: Every day | ORAL | Status: DC

## 2013-03-10 MED ORDER — HYDROMORPHONE 0.3 MG/ML IV SOLN
INTRAVENOUS | Status: AC
  Filled 2013-03-10: qty 25

## 2013-03-10 MED ORDER — PHENYLEPHRINE HCL 10 MG/ML IJ SOLN
INTRAMUSCULAR | Status: DC | PRN
  Administered 2013-03-10: 80 ug via INTRAVENOUS
  Administered 2013-03-10: 40 ug via INTRAVENOUS
  Administered 2013-03-10 (×2): 80 ug via INTRAVENOUS

## 2013-03-10 MED ORDER — SODIUM CHLORIDE 0.9 % IR SOLN
Status: DC | PRN
  Administered 2013-03-10: 1000 mL

## 2013-03-10 MED ORDER — ONDANSETRON HCL 4 MG/2ML IJ SOLN
4.0000 mg | Freq: Four times a day (QID) | INTRAMUSCULAR | Status: DC | PRN
  Filled 2013-03-10: qty 2

## 2013-03-10 MED ORDER — NALOXONE HCL 0.4 MG/ML IJ SOLN: 0.4000 mg | INTRAMUSCULAR | Status: DC | PRN

## 2013-03-10 MED ORDER — TRIAMTERENE-HCTZ 37.5-25 MG PO TABS
1.0000 | ORAL_TABLET | Freq: Every day | ORAL | Status: DC
  Administered 2013-03-10 – 2013-03-11 (×2): 1 via ORAL
  Filled 2013-03-10 (×2): qty 1

## 2013-03-10 MED ORDER — METHOCARBAMOL 500 MG PO TABS
500.0000 mg | ORAL_TABLET | Freq: Four times a day (QID) | ORAL | Status: DC
  Administered 2013-03-10 – 2013-03-11 (×3): 500 mg via ORAL
  Filled 2013-03-10 (×6): qty 1

## 2013-03-10 MED ORDER — LIDOCAINE HCL (CARDIAC) 20 MG/ML IV SOLN
INTRAVENOUS | Status: DC | PRN
  Administered 2013-03-10: 100 mg via INTRAVENOUS

## 2013-03-10 MED ORDER — MORPHINE SULFATE 4 MG/ML IJ SOLN: 4.0000 mg | INTRAMUSCULAR | Status: DC | PRN

## 2013-03-10 MED ORDER — DIPHENHYDRAMINE HCL 50 MG/ML IJ SOLN: 12.5000 mg | Freq: Four times a day (QID) | INTRAMUSCULAR | Status: DC | PRN

## 2013-03-10 MED ORDER — ONDANSETRON HCL 4 MG/2ML IJ SOLN
4.0000 mg | Freq: Four times a day (QID) | INTRAMUSCULAR | Status: DC | PRN
  Administered 2013-03-10: 4 mg via INTRAVENOUS

## 2013-03-10 MED ORDER — HEPARIN SOD (PORK) LOCK FLUSH 100 UNIT/ML IV SOLN
INTRAVENOUS | Status: DC | PRN
  Administered 2013-03-10: 500 [IU] via INTRAVENOUS

## 2013-03-10 MED ORDER — SODIUM CHLORIDE 0.9 % IJ SOLN: 9.0000 mL | INTRAMUSCULAR | Status: DC | PRN

## 2013-03-10 MED ORDER — PROPOFOL INFUSION 10 MG/ML OPTIME
INTRAVENOUS | Status: DC | PRN
  Administered 2013-03-10: 35 ug/kg/min via INTRAVENOUS
  Administered 2013-03-10: 25 ug/kg/min via INTRAVENOUS

## 2013-03-10 MED ORDER — PROPOFOL 10 MG/ML IV BOLUS
INTRAVENOUS | Status: DC | PRN
  Administered 2013-03-10: 200 mg via INTRAVENOUS

## 2013-03-10 MED ORDER — METOPROLOL TARTRATE 25 MG PO TABS
25.0000 mg | ORAL_TABLET | Freq: Two times a day (BID) | ORAL | Status: DC
  Administered 2013-03-10 – 2013-03-11 (×2): 25 mg via ORAL
  Filled 2013-03-10 (×3): qty 1

## 2013-03-10 MED ORDER — SODIUM CHLORIDE 0.9 % IR SOLN
Status: DC | PRN
  Administered 2013-03-10: 08:00:00

## 2013-03-10 MED ORDER — SODIUM CHLORIDE 0.9 % IR SOLN
Status: DC | PRN
  Administered 2013-03-10: 12:00:00

## 2013-03-10 MED ORDER — HEPARIN SOD (PORK) LOCK FLUSH 100 UNIT/ML IV SOLN
INTRAVENOUS | Status: AC
  Filled 2013-03-10: qty 5

## 2013-03-10 MED ORDER — ARTIFICIAL TEARS OP OINT
TOPICAL_OINTMENT | OPHTHALMIC | Status: DC | PRN
  Administered 2013-03-10: 1 via OPHTHALMIC

## 2013-03-10 MED ORDER — 0.9 % SODIUM CHLORIDE (POUR BTL) OPTIME
TOPICAL | Status: DC | PRN
  Administered 2013-03-10 (×5): 1000 mL

## 2013-03-10 MED ORDER — KCL IN DEXTROSE-NACL 20-5-0.9 MEQ/L-%-% IV SOLN
INTRAVENOUS | Status: DC
  Administered 2013-03-10: 18:00:00 via INTRAVENOUS
  Filled 2013-03-10 (×3): qty 1000

## 2013-03-10 MED ORDER — OXYCODONE-ACETAMINOPHEN 5-325 MG PO TABS: 1.0000 | ORAL_TABLET | ORAL | Status: DC | PRN

## 2013-03-10 MED ORDER — GLYCOPYRROLATE 0.2 MG/ML IJ SOLN
INTRAMUSCULAR | Status: DC | PRN
  Administered 2013-03-10: .8 mg via INTRAVENOUS

## 2013-03-10 MED ORDER — FENTANYL CITRATE 0.05 MG/ML IJ SOLN
INTRAMUSCULAR | Status: DC | PRN
Start: 2013-03-10 — End: 2013-03-10
  Administered 2013-03-10 (×4): 50 ug via INTRAVENOUS
  Administered 2013-03-10: 25 ug via INTRAVENOUS
  Administered 2013-03-10: 50 ug via INTRAVENOUS
  Administered 2013-03-10: 75 ug via INTRAVENOUS
  Administered 2013-03-10 (×5): 50 ug via INTRAVENOUS

## 2013-03-10 MED ORDER — ACETAMINOPHEN 325 MG PO TABS
650.0000 mg | ORAL_TABLET | Freq: Four times a day (QID) | ORAL | Status: DC | PRN
  Administered 2013-03-10 – 2013-03-11 (×3): 650 mg via ORAL
  Filled 2013-03-10 (×3): qty 2

## 2013-03-10 SURGICAL SUPPLY — 82 items
ADH SKN CLS APL DERMABOND .7 (GAUZE/BANDAGES/DRESSINGS) ×6
APPLIER CLIP 9.375 MED OPEN (MISCELLANEOUS) ×6
APR CLP MED 9.3 20 MLT OPN (MISCELLANEOUS) ×4
ATCH SMKEVC FLXB CAUT HNDSWH (FILTER) ×2 IMPLANT
BAG DECANTER FOR FLEXI CONT (MISCELLANEOUS) ×4 IMPLANT
BINDER BREAST XXLRG (GAUZE/BANDAGES/DRESSINGS) ×1 IMPLANT
BIOPATCH RED 1 DISK 7.0 (GAUZE/BANDAGES/DRESSINGS) ×8 IMPLANT
BLADE 15 SAFETY STRL DISP (BLADE) ×1 IMPLANT
CANISTER SUCTION 2500CC (MISCELLANEOUS) ×6 IMPLANT
CHLORAPREP W/TINT 26ML (MISCELLANEOUS) ×6 IMPLANT
CLIP APPLIE 9.375 MED OPEN (MISCELLANEOUS) IMPLANT
CONT SPEC 4OZ CLIKSEAL STRL BL (MISCELLANEOUS) ×4 IMPLANT
COVER PROBE W GEL 5X96 (DRAPES) ×3 IMPLANT
COVER SURGICAL LIGHT HANDLE (MISCELLANEOUS) ×7 IMPLANT
DERMABOND ADVANCED (GAUZE/BANDAGES/DRESSINGS) ×3
DERMABOND ADVANCED .7 DNX12 (GAUZE/BANDAGES/DRESSINGS) ×4 IMPLANT
DRAIN CHANNEL 19F RND (DRAIN) ×10 IMPLANT
DRAPE ORTHO SPLIT 77X108 STRL (DRAPES) ×12
DRAPE PROXIMA HALF (DRAPES) ×8 IMPLANT
DRAPE SURG 17X23 STRL (DRAPES) ×14 IMPLANT
DRAPE SURG ORHT 6 SPLT 77X108 (DRAPES) ×4 IMPLANT
DRAPE UTILITY 15X26 W/TAPE STR (DRAPE) ×8 IMPLANT
DRAPE WARM FLUID 44X44 (DRAPE) ×3 IMPLANT
DRSG PAD ABDOMINAL 8X10 ST (GAUZE/BANDAGES/DRESSINGS) ×9 IMPLANT
DRSG TEGADERM 4X4.75 (GAUZE/BANDAGES/DRESSINGS) ×8 IMPLANT
ELECT BLADE 6.5 EXT (BLADE) ×1 IMPLANT
ELECT CAUTERY BLADE 6.4 (BLADE) ×6 IMPLANT
ELECT REM PT RETURN 9FT ADLT (ELECTROSURGICAL) ×6
ELECTRODE REM PT RTRN 9FT ADLT (ELECTROSURGICAL) ×4 IMPLANT
EVACUATOR SILICONE 100CC (DRAIN) ×10 IMPLANT
EVACUATOR SMOKE ACCUVAC VALLEY (FILTER) ×1
EXPANDER BREAST CONT 850CC (Breast) ×2 IMPLANT
GAUZE XEROFORM 5X9 LF (GAUZE/BANDAGES/DRESSINGS) ×3 IMPLANT
GLOVE BIO SURGEON STRL SZ7.5 (GLOVE) ×9 IMPLANT
GLOVE BIO SURGEON STRL SZ8 (GLOVE) ×1 IMPLANT
GLOVE BIOGEL PI IND STRL 7.0 (GLOVE) IMPLANT
GLOVE BIOGEL PI IND STRL 8 (GLOVE) ×2 IMPLANT
GLOVE BIOGEL PI INDICATOR 7.0 (GLOVE) ×6
GLOVE BIOGEL PI INDICATOR 8 (GLOVE) ×2
GLOVE ECLIPSE 7.5 STRL STRAW (GLOVE) ×1 IMPLANT
GLOVE SURG SS PI 6.5 STRL IVOR (GLOVE) ×2 IMPLANT
GLOVE SURG SS PI 7.0 STRL IVOR (GLOVE) ×6 IMPLANT
GOWN STRL NON-REIN LRG LVL3 (GOWN DISPOSABLE) ×12 IMPLANT
GOWN STRL REIN XL XLG (GOWN DISPOSABLE) ×3 IMPLANT
KIT BASIN OR (CUSTOM PROCEDURE TRAY) ×6 IMPLANT
KIT PORT POWER 8FR ISP CVUE (Catheter) ×1 IMPLANT
KIT ROOM TURNOVER OR (KITS) ×6 IMPLANT
MARKER SKIN DUAL TIP RULER LAB (MISCELLANEOUS) ×7 IMPLANT
NDL 18GX1X1/2 (RX/OR ONLY) (NEEDLE) ×2 IMPLANT
NDL HYPO 25GX1X1/2 BEV (NEEDLE) ×2 IMPLANT
NEEDLE 18GX1X1/2 (RX/OR ONLY) (NEEDLE) ×6 IMPLANT
NEEDLE 22X1 1/2 (OR ONLY) (NEEDLE) ×1 IMPLANT
NEEDLE HYPO 25GX1X1/2 BEV (NEEDLE) ×3 IMPLANT
NS IRRIG 1000ML POUR BTL (IV SOLUTION) ×9 IMPLANT
PACK GENERAL/GYN (CUSTOM PROCEDURE TRAY) ×6 IMPLANT
PAD ABD 8X10 STRL (GAUZE/BANDAGES/DRESSINGS) ×1 IMPLANT
PAD ARMBOARD 7.5X6 YLW CONV (MISCELLANEOUS) ×9 IMPLANT
PREFILTER EVAC NS 1 1/3-3/8IN (MISCELLANEOUS) ×3 IMPLANT
SPECIMEN JAR LARGE (MISCELLANEOUS) ×4 IMPLANT
SPONGE GAUZE 4X4 12PLY (GAUZE/BANDAGES/DRESSINGS) ×4 IMPLANT
SPONGE LAP 18X18 X RAY DECT (DISPOSABLE) ×3 IMPLANT
STAPLER VISISTAT 35W (STAPLE) ×2 IMPLANT
SUT ETHILON 3 0 FSL (SUTURE) ×3 IMPLANT
SUT MNCRL AB 3-0 PS2 18 (SUTURE) ×13 IMPLANT
SUT MNCRL AB 4-0 PS2 18 (SUTURE) ×1 IMPLANT
SUT MON AB 3-0 SH 27 (SUTURE) ×6
SUT MON AB 3-0 SH27 (SUTURE) IMPLANT
SUT MON AB 4-0 PC3 18 (SUTURE) ×3 IMPLANT
SUT PROLENE 2 0 SH 30 (SUTURE) ×2 IMPLANT
SUT PROLENE 3 0 PS 2 (SUTURE) ×9 IMPLANT
SUT VIC AB 3-0 54X BRD REEL (SUTURE) ×2 IMPLANT
SUT VIC AB 3-0 BRD 54 (SUTURE) ×3
SUT VIC AB 3-0 SH 18 (SUTURE) ×7 IMPLANT
SUT VIC AB 3-0 SH 27 (SUTURE) ×6
SUT VIC AB 3-0 SH 27XBRD (SUTURE) ×2 IMPLANT
SYR BULB IRRIGATION 50ML (SYRINGE) ×4 IMPLANT
SYR CONTROL 10ML LL (SYRINGE) ×3 IMPLANT
SYRINGE 10CC LL (SYRINGE) ×2 IMPLANT
TOWEL OR 17X24 6PK STRL BLUE (TOWEL DISPOSABLE) ×6 IMPLANT
TOWEL OR 17X26 10 PK STRL BLUE (TOWEL DISPOSABLE) ×6 IMPLANT
TUBE CONNECTING 12X1/4 (SUCTIONS) ×4 IMPLANT
YANKAUER SUCT BULB TIP NO VENT (SUCTIONS) ×2 IMPLANT

## 2013-03-10 NOTE — Op Note (Signed)
03/10/2013  1:19 PM  PATIENT:  [name]  63 y.o. female  PRE-OPERATIVE DIAGNOSIS:  Right breast cancer  POST-OPERATIVE DIAGNOSIS:  Right breast cancer  PROCEDURE:  Procedure(s): BILATERAL MASTECTOMY WITH RIGHT SENTINEL LYMPH NODE BIOPSY FOLLOWED BY RECONSTRUCTION (Bilateral) PLACEMENT OF BILATERAL TISSUE EXPANDER WITH POSSIBLE FLEX HD (ACELLULAR HYDRATED DERMIS) FOR BREAST RECONSTRUCTION (Bilateral) INSERTION PORT-A-CATH (Left)  SURGEON:  Surgeon(s) and Role: Panel 1:    * Merrie Roof, MD - Primary  Panel 2:    * Crissie Reese, MD - Primary  PHYSICIAN ASSISTANT:   ASSISTANTS: none   ANESTHESIA:   general  EBL:  Total I/O In: 2250 [I.V.:2000; IV Piggyback:250] Out: 625 [Urine:325; Blood:300]  BLOOD ADMINISTERED:none  DRAINS: none   LOCAL MEDICATIONS USED:  NONE  SPECIMEN:  Source of Specimen:  bilateral breasts and right sentinel nodes X 2  DISPOSITION OF SPECIMEN:  PATHOLOGY  COUNTS:  YES  TOURNIQUET:  * No tourniquets in log *  DICTATION: .Dragon Dictation After informed consent was obtained the patient was brought to the operating room and placed in the supine position on the operating table. After adequate induction of general anesthesia the patient's left neck and chest area were prepped with ChloraPrep, dry, and draped in usual sterile manner. The patient was placed in Trendelenburg position. A large-bore needle from the Port-A-Cath kit was used to slide beneath the bend of the clavicle heading towards the sternal notch and in doing so we were able to access the left subclavian vein without difficulty. A wire was placed through the needle using the Seldinger technique without difficulty. The wire was confirmed in the central venous system using real-time fluoroscopy. Next the incision on the left chest was extended medially and laterally with the cautery. A subcutaneous pocket was created inferior to the incision with blunt finger dissection and some  sharp dissection with the cautery. Next the tubing was attached to the reservoir the reservoir was placed in the pocket. The length of the tubing was estimated using real-time fluoroscopy and cut to the appropriate length. Next a sheath and dilator were placed over the wire also using the Seldinger technique without difficulty. The dilator and wire were removed. The tubing was fed through the sheath as far as it can be fed and then held in place while the sheath was gently cracked and separated. Another real-time fluoroscopy image showed the tip of the catheter being in the distal superior vena cava. The permanent anchor was then used to attach the tubing to the reservoir. The reservoir was stitched in the subcutaneous pocket with 2 2-0 Prolene stitches. The port aspirated blood easily and then was flushed with the initial lead a dilute heparin solution and then with concentrated heparin solution. The subcutaneous tissue was closed over the port with interrupted 0 Vicryl stitches. The skin was then closed with a running 4-0 Monocryl subcuticular stitch. A Dermabond dressing was applied. At this point the drapes were removed. The patient's bilateral chest, breast, and axillary areas were then prepped with ChloraPrep, allowed to dry, and draped in usual sterile manner. Earlier in the day the patient underwent injection of 1 mCi of technetium sulfur colloid in the subareolar position the right. At this point, 2 cc methylene blue 3 cc of injectable saline were also injected in the subareolar position the right. Elliptical incisions were mapped out around the nipple and the area looked complex on both sides. Attention was first turned to the left breast. The incision was made  in elliptical fashion with a 10 blade knife. This incision was carried through the skin and subcutaneous tissue sharply with electrocautery. Skin edges were then used to elevate the skin flaps anteriorly towards the ceiling. Thin skin flaps were  then created between the subcutaneous fat and the breast tissue circumferentially. This dissection was done sharply with electrocautery until the dissection reached the chest wall. The breast was then removed from the chest wall with the pectoralis fascia. This was done sharply also with the electrocautery. Once the breast was removed it was oriented with a stitch on the lateral aspect and sent to pathology for further evaluation. The wound was irrigated with copious amounts of saline. Hemostasis was achieved using electrocautery. The wound was then packed with moistened lap sponges and closed loosely with staples. Attention was then turned to the right breast. A similar elliptical incision was made with a 10 blade knife. This incision was carried through the skin and subcutaneous tissue sharply with electrocautery. Breast which were then used to elevate the skin flaps anteriorly towards the ceiling. Thin skin flaps were created circumferentially using the electrocautery between the subcutaneous fat and the breast tissue. This dissection was carried all the way to the chest wall. Laterally we were able to use the neoprobe to identify a hot spot in the right axilla. 2 hot and blue lymph nodes were excised sharply with electrocautery. Ex vivo counts on these 2 nodes were 4000 and 2000 respectively. These were sent as sentinel node #1 and 2. Touch preps on these nodes are negative. Next the breast was removed from the chest wall with the pectoralis fascia. This dissection was done sharply with the cautery. Once the breast tissue was removed it was oriented with a stitch on the lateral aspect and sent to pathology. The wound was irrigated with copious amounts of saline. Hemostasis was achieved using the electrocautery. The wound was then packed with moistened lap sponges. The skin was closed loosely with staples. At this point the operation was turned over to Dr. Harlow Mares for the reconstruction. His portion will be  dictated separately. The patient was in stable condition.  PLAN OF CARE: Admit for overnight observation  PATIENT DISPOSITION:  PACU - hemodynamically stable.   Delay start of Pharmacological VTE agent (>24hrs) due to surgical blood loss or risk of bleeding: no

## 2013-03-10 NOTE — Progress Notes (Signed)
No pca used

## 2013-03-10 NOTE — Interval H&P Note (Signed)
History and Physical Interval Note:  03/10/2013 6:53 AM  [name]  has presented today for surgery, with the diagnosis of right breast cancer  The various methods of treatment have been discussed with the patient and family. After consideration of risks, benefits and other options for treatment, the patient has consented to  Procedure(s): BILATERAL MASTECTOMY WITH RIGHT SENTINEL LYMPH NODE BIOPSY FOLLOWED BY RECONSTRUCTION (Bilateral) PLACEMENT OF BILATERAL TISSUE EXPANDER WITH POSSIBLE FLEX HD (ACELLULAR HYDRATED DERMIS) FOR BREAST RECONSTRUCTION (Bilateral) and placement of port a cath as a surgical intervention .  The patient's history has been reviewed, patient examined, no change in status, stable for surgery.  I have reviewed the patient's chart and labs.  Questions were answered to the patient's satisfaction.     TOTH III,Lesli Issa S

## 2013-03-10 NOTE — Brief Op Note (Signed)
03/10/2013  3:07 PM  PATIENT:  [name]  63 y.o. female  PRE-OPERATIVE DIAGNOSIS:  Right breast cancer  POST-OPERATIVE DIAGNOSIS:  Right breast cancer  PROCEDURE:  Procedure(s): BILATERAL MASTECTOMY WITH RIGHT SENTINEL LYMPH NODE BIOPSY FOLLOWED BY RECONSTRUCTION (Bilateral) PLACEMENT OF BILATERAL TISSUE EXPANDER WITH POSSIBLE FLEX HD (ACELLULAR HYDRATED DERMIS) FOR BREAST RECONSTRUCTION (Bilateral) INSERTION PORT-A-CATH (Left)  SURGEON:  Surgeon(s) and Role: Panel 1:    * Merrie Roof, MD - Primary  Panel 2:    * Crissie Reese, MD - Primary  PHYSICIAN ASSISTANT:   ASSISTANTS: none   ANESTHESIA:   general  EBL:  Total I/O In: 3650 [I.V.:3400; IV Piggyback:250] Out: 750 [Urine:400; Blood:350]  BLOOD ADMINISTERED:none  DRAINS: (4) Jackson-Pratt drain(s) with closed bulb suction in the left (2) and right (2) chest   LOCAL MEDICATIONS USED:  NONE  SPECIMEN:  Source of Specimen:  Mastectomy wound edges bilateral  DISPOSITION OF SPECIMEN:  PATHOLOGY  COUNTS:  YES  TOURNIQUET:  * No tourniquets in log *  DICTATION: .Other Dictation: Dictation Number [id number]  PLAN OF CARE: Admit to inpatient   PATIENT DISPOSITION:  PACU - hemodynamically stable.   Delay start of Pharmacological VTE agent (>24hrs) due to surgical blood loss or risk of bleeding: no (she had heparin pre-op)

## 2013-03-10 NOTE — Anesthesia Postprocedure Evaluation (Signed)
  Anesthesia Post-op Note  Patient: [name]  Procedure(s) Performed: Procedure(s): BILATERAL MASTECTOMY WITH RIGHT SENTINEL LYMPH NODE BIOPSY FOLLOWED BY RECONSTRUCTION (Bilateral) PLACEMENT OF BILATERAL TISSUE EXPANDER WITH POSSIBLE FLEX HD (ACELLULAR HYDRATED DERMIS) FOR BREAST RECONSTRUCTION (Bilateral) INSERTION PORT-A-CATH (Left)  Patient Location: PACU  Anesthesia Type:General  Level of Consciousness: awake, alert , oriented and patient cooperative  Airway and Oxygen Therapy: Patient Spontanous Breathing  Post-op Pain: mild  Post-op Assessment: Post-op Vital signs reviewed, Patient's Cardiovascular Status Stable, Respiratory Function Stable, Patent Airway, No signs of Nausea or vomiting and Pain level controlled  Post-op Vital Signs: stable  Complications: No apparent anesthesia complications

## 2013-03-10 NOTE — H&P (Signed)
[name]  02/01/2013 9:10 AM   Office Visit  MRN:  [id number]   Description: 63 year old female  Provider: Merrie Roof, MD  Department: [facility]          Diagnoses      Breast cancer, right breast    -  Primary      174.9             Reason for Visit      Breast Cancer             Current Vitals - Last Recorded      BP Pulse Temp(Src) Resp Ht Wt      140/80 64 96.9 F (36.1 C) (Temporal) 20 _0  (1.6 m) 238 lb (107.956 kg)            BMI                42.17 kg/m2                        Progress Notes      Merrie Roof, MD at 02/04/2013  1:12 PM      Status: Signed            Patient ID: [name], female   DOB: [birth year], 63 y.o.   MRN: [id number]    Chief Complaint   Patient presents with   .  Breast Cancer        HPI [name] is a 63 y.o. female.  We're asked to see the patient in consultation by Dr. Hinton Rao to evaluate her for right breast cancer. The patient is a 63 year old white female who recently went for a routine screening mammogram. At that time she was found to have a small area in the right breast that was abnormal. As was biopsied and came back as breast cancer. The MRI unfortunately showed a much larger area of involvement as well as multiple areas of involvement. She is also HER-2 positive and will require chemotherapy. She denies any breast pain. She denies any discharge or nipple.  HPI    Past Medical History   Diagnosis  Date   .  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  Other specified disorder of skin  12/25/2008   .  ARTHROPATHY NOS, HAND  11/30/2006   .  NECK PAIN  11/22/2007   .  TOBACCO USE, QUIT  12/25/2008   .  ASTHMA  11/25/2006   .  HYPERTENSION  11/22/2007       echo card 2004 nl lv function    .  Ganglion cyst     .  Cough due to ACE inhibitor  04/28/2011         Past Surgical History   Procedure  Laterality  Date   .  Abdominal  hysterectomy    1989   .  Laparoscopic appendectomy    11/05/12    .  Wrist surgery    2001         Family History   Problem  Relation  Age of Onset   .  Alzheimer's disease  Mother     .  Hypertension  Mother     .  Diabetes  Other         1st degree relative   .  Diabetes  Father     .  Bladder  Cancer  Brother          Social History History   Substance Use Topics   .  Smoking status:  Former Smoker       Quit date:  03/04/1987   .  Smokeless tobacco:  Never Used   .  Alcohol Use:  No         Allergies   Allergen  Reactions   .  Ace Inhibitors  Cough   .  Codeine  Itching and Nausea Only         Current Outpatient Prescriptions   Medication  Sig  Dispense  Refill   .  aspirin 81 MG tablet  Take 81 mg by mouth daily.           Marland Kitchen  FLUoxetine (PROZAC) 10 MG capsule  TAKE ONE CAPSULE BY MOUTH ONCE DAILY   90 capsule   3   .  metoprolol (LOPRESSOR) 50 MG tablet  TAKE ONE-HALF TABLET BY MOUTH TWICE DAILY   90 tablet   1   .  triamterene-hydrochlorothiazide (MAXZIDE-25) 37.5-25 MG per tablet  TAKE ONE TABLET BY MOUTH EVERY DAY   90 tablet   3       No current facility-administered medications for this visit.        Review of Systems Review of Systems  Constitutional: Negative.   HENT: Negative.   Eyes: Negative.   Respiratory: Negative.   Cardiovascular: Negative.   Gastrointestinal: Negative.   Endocrine: Negative.   Genitourinary: Negative.   Musculoskeletal: Negative.   Skin: Negative.   Allergic/Immunologic: Negative.   Neurological: Negative.   Hematological: Negative.   Psychiatric/Behavioral: Negative.       Blood pressure 140/80, pulse 64, temperature 96.9 F (36.1 C), temperature source Temporal, resp. rate 20, height _0  (1.6 m), weight 238 lb (107.956 kg).   Physical Exam Physical Exam  Constitutional: She is oriented to person, place, and time. She appears well-developed and well-nourished.  HENT:   Head: Normocephalic and  atraumatic.  Eyes: Conjunctivae and EOM are normal. Pupils are equal, round, and reactive to light.  Neck: Normal range of motion. Neck supple.  Cardiovascular: Normal rate, regular rhythm and normal heart sounds.   Pulmonary/Chest: Effort normal and breath sounds normal.  There is no palpable mass in either breast. There is no palpable axillary, supraclavicular, or cervical lymphadenopathy  Abdominal: Soft. Bowel sounds are normal. She exhibits no mass. There is no tenderness.  Musculoskeletal: Normal range of motion.  Lymphadenopathy:    She has no cervical adenopathy.  Neurological: She is alert and oriented to person, place, and time.  Skin: Skin is warm and dry.  Psychiatric: She has a normal mood and affect. Her behavior is normal.      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 Because of this I think the most appropriate surgical treatment for her would be a mastectomy with sentinel node mapping. I have discussed with her in detail the risks and benefits of the operation to this as well as some of the technical aspects and she understands and wishes to proceed. She actually favors bilateral mastectomies with reconstruction. She will be meeting with Dr. Harlow Mares to discuss the options. Because of her need for chemotherapy she will also need a Port-A-Cath placed. I've also discussed with her the risks and benefits as well as some of the technical aspects  of placing a port and she understands and would like to proceed with that as well      Plan    Plan for bilateral mastectomy with right breast sentinel node biopsy and Port-A-Cath placement with reconstruction by Dr. Harlow Mares

## 2013-03-10 NOTE — Anesthesia Procedure Notes (Addendum)
Procedure Name: Intubation Date/Time: 03/10/2013 7:36 AM Performed by: Neldon Newport Pre-anesthesia Checklist: Patient identified, Timeout performed, Emergency Drugs available, Suction available and Patient being monitored Patient Re-evaluated:Patient Re-evaluated prior to inductionOxygen Delivery Method: Circle system utilized Preoxygenation: Pre-oxygenation with 100% oxygen Intubation Type: IV induction Ventilation: Mask ventilation without difficulty Laryngoscope Size: Mac and 3 Tube size: 7.0 mm Number of attempts: 1 Placement Confirmation: ETT inserted through vocal cords under direct vision,  positive ETCO2 and breath sounds checked- equal and bilateral Secured at: 22 cm Tube secured with: Tape Dental Injury: Teeth and Oropharynx as per pre-operative assessment

## 2013-03-10 NOTE — Anesthesia Preprocedure Evaluation (Addendum)
Anesthesia Evaluation  Patient identified by MRN, date of birth, ID band Patient awake    Reviewed: Allergy & Precautions, H&P , NPO status , Patient's Chart, lab work & pertinent test results, reviewed documented beta blocker date and time   History of Anesthesia Complications (+) PONV and history of anesthetic complications  Airway Mallampati: II TM Distance: >3 FB Neck ROM: full    Dental  (+) Teeth Intact and Dental Advidsory Given   Pulmonary asthma , former smoker,    Pulmonary exam normal       Cardiovascular hypertension, On Medications and Pt. on home beta blockers - angina- CAD and - DOE Rhythm:Regular Rate:Normal     Neuro/Psych PSYCHIATRIC DISORDERS Anxiety Depression    GI/Hepatic negative GI ROS, Neg liver ROS,   Endo/Other  negative endocrine ROS  Renal/GU negative Renal ROS  negative genitourinary   Musculoskeletal negative musculoskeletal ROS (+)   Abdominal   Peds  Hematology negative hematology ROS (+)   Anesthesia Other Findings Obese.   Reproductive/Obstetrics                          Anesthesia Physical Anesthesia Plan  ASA: II  Anesthesia Plan: General   Post-op Pain Management:    Induction: Intravenous  Airway Management Planned: Oral ETT  Additional Equipment: None  Intra-op Plan:   Post-operative Plan: Extubation in OR  Informed Consent: I have reviewed the patients History and Physical, chart, labs and discussed the procedure including the risks, benefits and alternatives for the proposed anesthesia with the patient or authorized representative who has indicated his/her understanding and acceptance.   Dental Advisory Given and Dental advisory given  Plan Discussed with: Anesthesiologist, CRNA and Surgeon  Anesthesia Plan Comments:        Anesthesia Quick Evaluation

## 2013-03-10 NOTE — Transfer of Care (Signed)
Immediate Anesthesia Transfer of Care Note  Patient: [name]  Procedure(s) Performed: Procedure(s): BILATERAL MASTECTOMY WITH RIGHT SENTINEL LYMPH NODE BIOPSY FOLLOWED BY RECONSTRUCTION (Bilateral) PLACEMENT OF BILATERAL TISSUE EXPANDER WITH POSSIBLE FLEX HD (ACELLULAR HYDRATED DERMIS) FOR BREAST RECONSTRUCTION (Bilateral) INSERTION PORT-A-CATH (Left)  Patient Location: PACU  Anesthesia Type:General  Level of Consciousness: awake, alert  and oriented  Airway & Oxygen Therapy: Patient Spontanous Breathing and Patient connected to face mask oxygen  Post-op Assessment: Report given to PACU RN and Patient moving all extremities X 4  Post vital signs: Reviewed and stable  Complications: No apparent anesthesia complications

## 2013-03-11 LAB — BASIC METABOLIC PANEL
BUN: 10 mg/dL (ref 6–23)
CALCIUM: 8.7 mg/dL (ref 8.4–10.5)
CO2: 28 meq/L (ref 19–32)
CREATININE: 0.67 mg/dL (ref 0.50–1.10)
Chloride: 103 mEq/L (ref 96–112)
GFR calc non Af Amer: >90 mL/min (ref >90)
Glucose, Bld: 168 mg/dL — ABNORMAL HIGH (ref 70–99)
Potassium: 4.8 mEq/L (ref 3.7–5.3)
Sodium: 141 mEq/L (ref 137–147)

## 2013-03-11 LAB — CBC
HCT: 39.4 % (ref 36.0–46.0)
Hemoglobin: 13.3 g/dL (ref 12.0–15.0)
MCH: 29.7 pg (ref 26.0–34.0)
MCHC: 33.8 g/dL (ref 30.0–36.0)
MCV: 87.9 fL (ref 78.0–100.0)
PLATELETS: 142 10*3/uL — AB (ref 150–400)
RBC: 4.48 MIL/uL (ref 3.87–5.11)
RDW: 13.2 % (ref 11.5–15.5)
WBC: 11.8 10*3/uL — ABNORMAL HIGH (ref 4.0–10.5)

## 2013-03-11 MED ORDER — ENOXAPARIN SODIUM 40 MG/0.4ML ~~LOC~~ SOLN: 40.0000 mg | SUBCUTANEOUS | Status: DC

## 2013-03-11 MED ORDER — METHOCARBAMOL 500 MG PO TABS: 500.0000 mg | ORAL_TABLET | Freq: Four times a day (QID) | ORAL | Status: DC

## 2013-03-11 MED ORDER — DSS 100 MG PO CAPS: 100.0000 mg | ORAL_CAPSULE | Freq: Every day | ORAL | Status: DC

## 2013-03-11 MED ORDER — HYDROCODONE-ACETAMINOPHEN 10-325 MG PO TABS: 1.0000 | ORAL_TABLET | Freq: Four times a day (QID) | ORAL | Status: DC | PRN

## 2013-03-11 MED ORDER — CEPHALEXIN 500 MG PO CAPS: 500.0000 mg | ORAL_CAPSULE | Freq: Four times a day (QID) | ORAL | Status: DC

## 2013-03-11 MED FILL — Sodium Chloride IV Soln 0.9%: INTRAVENOUS | Qty: 1000 | Status: AC

## 2013-03-11 MED FILL — Gentamicin Sulfate Inj 40 MG/ML: INTRAMUSCULAR | Qty: 2 | Status: AC

## 2013-03-11 MED FILL — Bacitracin Intramuscular For Soln 50000 Unit: INTRAMUSCULAR | Qty: 1 | Status: AC

## 2013-03-11 MED FILL — Cefazolin Sodium For IV Soln 1 GM: INTRAVENOUS | Qty: 1 | Status: AC

## 2013-03-11 NOTE — Discharge Instructions (Addendum)
No lifting for 6 weeks No vigorous activity for 6 weeks (including outdoor walks) No driving for 4 weeks OK to walk up stairs slowly Stay propped up Use incentive spirometer at home every hour while awake No shower while drains are in place Empty drains at least three times a day and record the amounts separately Change drain dressings every third day if instructed to do so by Dr. Harlow Mares (I will show you how to do this when you come to the office on Wednesday)  Apply Bacitracin antibiotic ointment to the drain sites  Place gauze dressing over drains  Secure the gauze with tape Take an over-the-counter Probiotic while on antibiotics Take an over-the-counter stool softener (such as Colace) while on pain medication Do not raise arms overhead See Dr. Harlow Mares in the office on Wednesday For questions call [phone] or [phone]

## 2013-03-11 NOTE — Op Note (Signed)
[name], [name]         ACCOUNT NO.:  [id number]  MEDICAL RECORD NO.:  [id number]  LOCATION:  [id number]                        FACILITY:  [facility]  PHYSICIAN:  Crissie Reese, M.D.     DATE OF BIRTH:  June 03, 1950  DATE OF PROCEDURE:  03/10/2013 DATE OF DISCHARGE:                              OPERATIVE REPORT   PREOPERATIVE DIAGNOSIS:  Breast cancer.  POSTOPERATIVE DIAGNOSIS:  Breast cancer.  PROCEDURE PERFORMED:  Bilateral immediate breast reconstruction with tissue expander.  SURGEON:  Crissie Reese, MD  ANESTHESIA:  General.  ESTIMATED BLOOD LOSS:  40 mL.  DRAINS:  Two 19-French on each side.  CLINICAL NOTE:  A 63 year old woman has breast cancer and is having bilateral mastectomy.  She desired reconstruction and options were discussed with her at length.  She selected placement of tissue expander as a staged procedure for eventual placement of an implant.  Next the procedure, risks, plus complications were discussed with her in great detail.  These risks include, but not limited to, bleeding, infection, healing problems, scarring, loss of sensation, fluid accumulations, anesthesia related complications, pneumothorax, DVT, pulmonary embolism, failure of device, capsular contracture, displacement of device, wrinkles and ripples, chronic pain, disappointment, contour deformities at the periphery of the expander and overlying the expander as well as loss of tissue including skin of the chest wall.  She understood all of this and wished to proceed.  DESCRIPTION OF PROCEDURE:  The patient was in the operating room and General Surgery had completed bilateral mastectomy.  There was some excess skin and there was a little bit of question of viability of the distal skin flaps from the mastectomy.  The excision of some of the redundant skin was performed from superior and inferior mastectomy flaps bilateral.  This brought the mastectomy flaps back to nice 2 edges with bright red  bleeding consistent with viability.  There was a good color in the superior inferior mastectomy flaps bilateral.  The wounds were irrigated thoroughly with saline.  The pectoralis major muscles elevated with great care taken to avoid damage to underlying chest cavity. Dissection was continued down to the inframammary crease bilateral. Serratus anterior was elevated for a very short distance lateral.  This created a nice submuscular space.  Thorough irrigation with saline as well as antibiotic solution and excellent hemostasis was achieved using electrocautery.  After thoroughly cleaning gloves, the expanders were prepared.  These were Mentor 850 cc tissue expanders.  150 mL of sterile saline placed using a closed filling system, and the expanders were soaked in antibiotic solution.  Antibiotic solution again placed in the submuscular space and mastectomy space and a careful check was made for hemostasis which was noted to be excellent.  The expanders were then positioned with great care taken to make sure that there were oriented properly.  Care was taken to make sure that they also were positioned flat without wrinkles and folds and creases.  The muscle closure was performed using 3-0 Vicryl simple interrupted sutures.  This achieved a full submuscular coverage.  Again thorough irrigation with saline and meticulous hemostasis with electrocautery.  Two 19-French drains were positioned on each side, one in the lateral gutter and then another under the inferior and superior  mastectomy flaps.  These were secured with 3-0 Prolene sutures.  The skin closure with 3-0 Monocryl inverted deep dermal sutures and running 3-0 Monocryl subcuticular suture.  The drains were charged and the drain dressings with Biopatch and Tegaderm. Dermabond was used to seal the mastectomy incisions and ABDs were placed over the wounds as well as the chest vest for light compression.  She was transferred to the  recovery room stable having tolerated the procedure well.     Crissie Reese, M.D.     DB/MEDQ  D:  03/10/2013  T:  03/11/2013  Job:  [id number]

## 2013-03-11 NOTE — Discharge Summary (Signed)
Physician Discharge Summary  Patient ID: [name] MRN: [id number] DOB/AGE: 10-Jul-1950 85 y.o.  Admit date: 03/10/2013 Discharge date: 03/11/2013  Admission Diagnoses:Breast cancer  Discharge Diagnoses:Same  Active Problems:   Breast cancer   Discharged Condition: good  Hospital Course: On the day of admission the patient was taken to surgery and had bilateral mastectomy, right sentinel node, bilateral breast reconstruction with tissue expanders. The patient tolerated the procedures well. Postoperatively, the mastectomy flaps maintained excellent color and capillary refill. The patient was ambulatory and tolerating diet on the first postoperative day. She would like to go home.  Treatments: antibiotics: Ancef, anticoagulation: heparin and surgery: bilateral mastectomy, right sentinel node, placement of tissue expanders  Discharge Exam: Blood pressure 141/69, pulse 85, temperature 98.1 F (36.7 C), temperature source Oral, resp. rate 16, height 5\' 3"  (1.6 m), weight 246 lb 11.1 oz (111.9 kg), SpO2 98.00%.  Operative sites: Mastectomy flaps viable. Tissue expanders appear to be in good position. Drains functioning. Drainage thin. No evidence of bleeding or infection either side.  Disposition: Discharge home   Future Appointments Provider Department Dept Phone   03/25/2013 1:30 PM Merrie Roof, MD [facility], [place] [phone]       Medication List    STOP taking these medications       aspirin 81 MG tablet     multivitamin with minerals tablet      TAKE these medications       cephALEXin 500 MG capsule  Commonly known as:  KEFLEX  Take 1 capsule (500 mg total) by mouth 4 (four) times daily.     DSS 100 MG Caps  Take 100 mg by mouth daily.     enoxaparin 40 MG/0.4ML injection  Commonly known as:  LOVENOX  Inject 0.4 mLs (40 mg total) into the skin daily.  Start taking on:  03/12/2013     FLUoxetine 10 MG capsule  Commonly known as:  PROZAC   Take 10 mg by mouth daily.     HYDROcodone-acetaminophen 10-325 MG per tablet  Commonly known as:  NORCO  Take 1 tablet by mouth every 6 (six) hours as needed.     methocarbamol 500 MG tablet  Commonly known as:  ROBAXIN  Take 1 tablet (500 mg total) by mouth 4 (four) times daily.     metoprolol 50 MG tablet  Commonly known as:  LOPRESSOR  Take 25 mg by mouth 2 (two) times daily.     triamterene-hydrochlorothiazide 37.5-25 MG per tablet  Commonly known as:  MAXZIDE-25  Take 1 tablet by mouth daily.         SignedHarlow Mares, Eugune Sine M 03/11/2013, 8:40 AM

## 2013-03-14 ENCOUNTER — Telehealth (INDEPENDENT_AMBULATORY_CARE_PROVIDER_SITE_OTHER): Payer: Self-pay

## 2013-03-14 NOTE — Telephone Encounter (Signed)
Dr Marlou Starks tried calling pt with path report. No answer or VM avail. Also had to move appt time back to 2:40 due to MD surgery schedule.

## 2013-03-15 ENCOUNTER — Other Ambulatory Visit (INDEPENDENT_AMBULATORY_CARE_PROVIDER_SITE_OTHER): Payer: Self-pay | Admitting: *Deleted

## 2013-03-15 MED ORDER — UNABLE TO FIND: Status: DC

## 2013-03-16 ENCOUNTER — Encounter (HOSPITAL_COMMUNITY): Payer: Self-pay | Admitting: General Surgery

## 2013-03-25 ENCOUNTER — Encounter (INDEPENDENT_AMBULATORY_CARE_PROVIDER_SITE_OTHER): Payer: Self-pay | Admitting: General Surgery

## 2013-03-25 ENCOUNTER — Ambulatory Visit (INDEPENDENT_AMBULATORY_CARE_PROVIDER_SITE_OTHER): Payer: BC Managed Care – PPO | Admitting: General Surgery

## 2013-03-25 ENCOUNTER — Other Ambulatory Visit (INDEPENDENT_AMBULATORY_CARE_PROVIDER_SITE_OTHER): Payer: Self-pay

## 2013-03-25 VITALS — BP 124/78 | HR 76 | Temp 98.0°F | Resp 18 | Ht 63.0 in | Wt 232.4 lb

## 2013-03-25 DIAGNOSIS — C50911 Malignant neoplasm of unspecified site of right female breast: Secondary | ICD-10-CM

## 2013-03-25 DIAGNOSIS — C50919 Malignant neoplasm of unspecified site of unspecified female breast: Secondary | ICD-10-CM

## 2013-03-25 NOTE — Progress Notes (Signed)
Subjective:     Patient ID: EMIE SOMMERFELD, female   DOB: December 06, 1950, 63 y.o.   MRN: [id number]  HPI The patient is a 63 year old white female who is about 2 weeks status post bilateral mastectomies and right sentinel node biopsy for a right-sided ER PR negative and HER-2 positive breast cancer. She was a T1 C. N0. She has tolerated the surgery well. She has no complaints today. She has had some increased output from the remaining drain on the left side over the last couple days.  Review of Systems     Objective:   Physical Exam On exam both mastectomy incisions are healing nicely with no sign of infection. Her skin flaps appear to be viable. There is a little more redness to the left superior flap which I believe is secondary to the thinness of the flap but there is still good capillary refill. There are no areas of necrosis. Her port site looks good.    Assessment:     The patient is 2 weeks status post bilateral mastectomies for a right-sided breast cancer     Plan:     At this point she will continue to follow up with Dr. Harlow Mares to manage her drains. I will plan to see her back in one month to check her progress

## 2013-03-28 ENCOUNTER — Telehealth (INDEPENDENT_AMBULATORY_CARE_PROVIDER_SITE_OTHER): Payer: Self-pay | Admitting: General Surgery

## 2013-03-28 NOTE — Telephone Encounter (Signed)
Caren Griffins, RN at Dr. Remi Deter office [facility],) called to request a copy of the pt's pathology report.  Printed and FAX it to [phone]; receipt confirmation obtained.

## 2013-04-03 HISTORY — PX: CHOLECYSTECTOMY: SHX55

## 2013-04-19 ENCOUNTER — Encounter (INDEPENDENT_AMBULATORY_CARE_PROVIDER_SITE_OTHER): Payer: BC Managed Care – PPO | Admitting: General Surgery

## 2013-04-27 ENCOUNTER — Encounter (INDEPENDENT_AMBULATORY_CARE_PROVIDER_SITE_OTHER): Payer: BC Managed Care – PPO | Admitting: General Surgery

## 2013-05-02 ENCOUNTER — Encounter (INDEPENDENT_AMBULATORY_CARE_PROVIDER_SITE_OTHER): Payer: Self-pay | Admitting: General Surgery

## 2013-05-02 ENCOUNTER — Other Ambulatory Visit: Payer: Self-pay | Admitting: Plastic Surgery

## 2013-05-02 ENCOUNTER — Ambulatory Visit (INDEPENDENT_AMBULATORY_CARE_PROVIDER_SITE_OTHER): Payer: BC Managed Care – PPO | Admitting: General Surgery

## 2013-05-02 VITALS — BP 132/76 | HR 78 | Temp 97.5°F | Resp 24 | Ht 63.0 in | Wt 231.0 lb

## 2013-05-02 DIAGNOSIS — C50911 Malignant neoplasm of unspecified site of right female breast: Secondary | ICD-10-CM

## 2013-05-02 DIAGNOSIS — C50919 Malignant neoplasm of unspecified site of unspecified female breast: Secondary | ICD-10-CM

## 2013-05-02 NOTE — Patient Instructions (Signed)
Will see what Dr. Harlow Mares thinks about redness left breast

## 2013-05-02 NOTE — Progress Notes (Signed)
Subjective:     Patient ID: [name], female   DOB: Feb 20, 1951, 63 y.o.   MRN: [id number]  HPI The patient is a 63 year old white female who is 2 months status post bilateral mastectomies and right sentinel lymph node biopsy with reconstruction. She was ER and PR negative and HER-2 positive. Her cancer is staged as a T1 C. N0. Since her last visit she also had her gallbladder out and is feeling much better. She has started her chemotherapy. Over the last couple days she has noticed some increasing redness of the left breast area. She denies any fevers or chills.  Review of Systems     Objective:   Physical Exam On exam both mastectomy incisions are healing nicely. The skin flaps of her right chest wall looked healthy. There is significant cellulitis of the left chest wall especially laterally.    Assessment:     The patient is 2 months status post bilateral mastectomies and right sentinel node biopsy with reconstruction. She has now developed some cellulitis of the left chest wall     Plan:     She'll be seeing Dr. Harlow Mares this afternoon and we will decide whether or she will need to have the tissue expander removed and whether she would need to be placed on antibiotics. I will consult Dr. Harlow Mares after he has a chance to see her.

## 2013-05-04 ENCOUNTER — Encounter (HOSPITAL_COMMUNITY): Payer: Self-pay

## 2013-05-04 ENCOUNTER — Encounter (HOSPITAL_COMMUNITY)
Admission: RE | Admit: 2013-05-04 | Discharge: 2013-05-04 | Disposition: A | Discharge status: Home / Self Care | Payer: BC Managed Care – PPO | Source: Ambulatory Visit | Attending: Plastic Surgery | Admitting: Plastic Surgery

## 2013-05-04 ENCOUNTER — Encounter (HOSPITAL_COMMUNITY): Payer: Self-pay | Admitting: Pharmacy Technician

## 2013-05-04 HISTORY — DX: Gastro-esophageal reflux disease without esophagitis: K21.9

## 2013-05-04 LAB — BASIC METABOLIC PANEL
BUN: 15 mg/dL (ref 6–23)
CO2: 25 mEq/L (ref 19–32)
Calcium: 9.4 mg/dL (ref 8.4–10.5)
Chloride: 102 mEq/L (ref 96–112)
Creatinine, Ser: 0.78 mg/dL (ref 0.50–1.10)
GFR calc Af Amer: >90 mL/min (ref >90)
GFR, EST NON AFRICAN AMERICAN: 88 mL/min — AB (ref >90)
GLUCOSE: 167 mg/dL — AB (ref 70–99)
Potassium: 3.9 mEq/L (ref 3.7–5.3)
Sodium: 141 mEq/L (ref 137–147)

## 2013-05-04 LAB — CBC
HCT: 36.6 % (ref 36.0–46.0)
Hemoglobin: 12.5 g/dL (ref 12.0–15.0)
MCH: 29.4 pg (ref 26.0–34.0)
MCHC: 34.2 g/dL (ref 30.0–36.0)
MCV: 86.1 fL (ref 78.0–100.0)
Platelets: 328 10*3/uL (ref 150–400)
RBC: 4.25 MIL/uL (ref 3.87–5.11)
RDW: 14.3 % (ref 11.5–15.5)
WBC: 12.4 10*3/uL — ABNORMAL HIGH (ref 4.0–10.5)

## 2013-05-04 MED ORDER — CHLORHEXIDINE GLUCONATE 4 % EX LIQD
1.0000 "application " | Freq: Once | CUTANEOUS | Status: DC
  Filled 2013-05-04: qty 15

## 2013-05-04 MED ORDER — VANCOMYCIN HCL 10 G IV SOLR
1500.0000 mg | INTRAVENOUS | Status: AC
  Administered 2013-05-05: 1500 mg via INTRAVENOUS
  Filled 2013-05-04: qty 1500

## 2013-05-04 MED ORDER — HEPARIN SODIUM (PORCINE) 5000 UNIT/ML IJ SOLN: 5000.0000 [IU] | Freq: Once | INTRAMUSCULAR | Status: AC

## 2013-05-04 NOTE — Progress Notes (Signed)
05/04/13 1409  OBSTRUCTIVE SLEEP APNEA  Have you ever been diagnosed with sleep apnea through a sleep study? No  Do you snore loudly (loud enough to be heard through closed doors)?  1  Do you often feel tired, fatigued, or sleepy during the daytime? 0  Has anyone observed you stop breathing during your sleep? 0  Do you have, or are you being treated for high blood pressure? 1  BMI more than 35 kg/m2? 1  Age over 63 years old? 1  Neck circumference greater than 40 cm/18 inches? 0 (15)  Gender: 0  Obstructive Sleep Apnea Score 4  Score 4 or greater  Results sent to PCP

## 2013-05-04 NOTE — Pre-Procedure Instructions (Addendum)
[name]  05/04/2013   Your procedure is scheduled on:  05/05/13  Report to [facility] short stay admitting at call [phone] am of surgery between 730 & 8 AM.to get time to arrive  Call this number if you have problems the morning of surgery: [phone]   Remember:   Do not eat food or drink liquids after midnight.   Take these medicines the morning of surgery with A SIP OF WATER: prozac, robaxin, metoprolol          STOP all herbel meds, nsaids (aleve,naproxen,advil,ibuprofen) noe including vitamins, aspirin   Do not wear jewelry, make-up or nail polish.  Do not wear lotions, powders, or perfumes. You may wear deodorant.  Do not shave 48 hours prior to surgery. Men may shave face and neck.  Do not bring valuables to the hospital.  [facility] is not responsible                  for any belongings or valuables.               Contacts, dentures or bridgework may not be worn into surgery.  Leave suitcase in the car. After surgery it may be brought to your room.  For patients admitted to the hospital, discharge time is determined by your                treatment team.               Patients discharged the day of surgery will not be allowed to drive  home.  Name and phone number of your driver:   Special Instructions:  Special Instructions: [facility] - Preparing for Surgery  Before surgery, you can play an important role.  Because skin is not sterile, your skin needs to be as free of germs as possible.  You can reduce the number of germs on you skin by washing with CHG (chlorahexidine gluconate) soap before surgery.  CHG is an antiseptic cleaner which kills germs and bonds with the skin to continue killing germs even after washing.  Please DO NOT use if you have an allergy to CHG or antibacterial soaps.  If your skin becomes reddened/irritated stop using the CHG and inform your nurse when you arrive at Short Stay.  Do not shave (including legs and underarms) for at least 48  hours prior to the first CHG shower.  You may shave your face.  Please follow these instructions carefully:   1.  Shower with CHG Soap the night before surgery and the morning of Surgery.  2.  If you choose to wash your hair, wash your hair first as usual with your normal shampoo.  3.  After you shampoo, rinse your hair and body thoroughly to remove the Shampoo.  4.  Use CHG as you would any other liquid soap.  You can apply chg directly  to the skin and wash gently with scrungie or a clean washcloth.  5.  Apply the CHG Soap to your body ONLY FROM THE NECK DOWN.  Do not use on open wounds or open sores.  Avoid contact with your eyes ears, mouth and genitals (private parts).  Wash genitals (private parts)       with your normal soap.  6.  Wash thoroughly, paying special attention to the area where your surgery will be performed.  7.  Thoroughly rinse your body with warm water from the neck down.  8.  DO NOT shower/wash with your normal  soap after using and rinsing off the CHG Soap.  9.  Pat yourself dry with a clean towel.            10.  Wear clean pajamas.            11.  Place clean sheets on your bed the night of your first shower and do not sleep with pets.  Day of Surgery  Do not apply any lotions/deodorants the morning of surgery.  Please wear clean clothes to the hospital/surgery center.   Please read over the following fact sheets that you were given: Pain Booklet, Coughing and Deep Breathing and Surgical Site Infection Prevention

## 2013-05-05 ENCOUNTER — Ambulatory Visit (HOSPITAL_COMMUNITY): Payer: BC Managed Care – PPO | Admitting: Certified Registered Nurse Anesthetist

## 2013-05-05 ENCOUNTER — Encounter (HOSPITAL_COMMUNITY): Payer: BC Managed Care – PPO | Admitting: Certified Registered Nurse Anesthetist

## 2013-05-05 ENCOUNTER — Encounter (HOSPITAL_COMMUNITY)
Admission: RE | Disposition: A | Discharge status: Home / Self Care | Payer: Self-pay | Source: Ambulatory Visit | Attending: Plastic Surgery

## 2013-05-05 ENCOUNTER — Encounter (HOSPITAL_COMMUNITY): Payer: Self-pay | Admitting: *Deleted

## 2013-05-05 ENCOUNTER — Ambulatory Visit (HOSPITAL_COMMUNITY)
Admission: RE | Admit: 2013-05-05 | Discharge: 2013-05-06 | Disposition: A | Discharge status: Home / Self Care | Payer: BC Managed Care – PPO | Source: Ambulatory Visit | Attending: Plastic Surgery | Admitting: Plastic Surgery

## 2013-05-05 DIAGNOSIS — Z9010 Acquired absence of unspecified breast and nipple: Secondary | ICD-10-CM | POA: Insufficient documentation

## 2013-05-05 DIAGNOSIS — C50911 Malignant neoplasm of unspecified site of right female breast: Secondary | ICD-10-CM

## 2013-05-05 DIAGNOSIS — Z87891 Personal history of nicotine dependence: Secondary | ICD-10-CM | POA: Insufficient documentation

## 2013-05-05 DIAGNOSIS — I10 Essential (primary) hypertension: Secondary | ICD-10-CM | POA: Insufficient documentation

## 2013-05-05 DIAGNOSIS — Z853 Personal history of malignant neoplasm of breast: Secondary | ICD-10-CM | POA: Insufficient documentation

## 2013-05-05 DIAGNOSIS — Y838 Other surgical procedures as the cause of abnormal reaction of the patient, or of later complication, without mention of misadventure at the time of the procedure: Secondary | ICD-10-CM | POA: Insufficient documentation

## 2013-05-05 DIAGNOSIS — Z01812 Encounter for preprocedural laboratory examination: Secondary | ICD-10-CM | POA: Insufficient documentation

## 2013-05-05 DIAGNOSIS — IMO0002 Reserved for concepts with insufficient information to code with codable children: Secondary | ICD-10-CM | POA: Diagnosis present

## 2013-05-05 HISTORY — PX: REMOVAL OF TISSUE EXPANDER AND PLACEMENT OF IMPLANT: SHX6457

## 2013-05-05 LAB — GRAM STAIN

## 2013-05-05 SURGERY — REMOVAL, TISSUE EXPANDER, BREAST, WITH IMPLANT INSERTION
Anesthesia: Monitor Anesthesia Care | Site: Breast | Laterality: Left

## 2013-05-05 MED ORDER — PROPOFOL 10 MG/ML IV BOLUS
INTRAVENOUS | Status: AC
  Filled 2013-05-05: qty 20

## 2013-05-05 MED ORDER — DOCUSATE SODIUM 100 MG PO CAPS
100.0000 mg | ORAL_CAPSULE | Freq: Every day | ORAL | Status: DC
Start: 2013-05-05 — End: 2013-05-06
  Administered 2013-05-06 (×2): 100 mg via ORAL
  Filled 2013-05-05 (×2): qty 1

## 2013-05-05 MED ORDER — METHOCARBAMOL 500 MG PO TABS
500.0000 mg | ORAL_TABLET | Freq: Four times a day (QID) | ORAL | Status: DC
  Administered 2013-05-06 (×2): 500 mg via ORAL
  Filled 2013-05-05 (×5): qty 1

## 2013-05-05 MED ORDER — HEPARIN SODIUM (PORCINE) 5000 UNIT/ML IJ SOLN
INTRAMUSCULAR | Status: AC
  Administered 2013-05-05: 5000 [IU] via SUBCUTANEOUS
  Filled 2013-05-05: qty 1

## 2013-05-05 MED ORDER — PROPOFOL INFUSION 10 MG/ML OPTIME
INTRAVENOUS | Status: DC | PRN
  Administered 2013-05-05: 1 mL via INTRAVENOUS

## 2013-05-05 MED ORDER — LIDOCAINE-EPINEPHRINE 0.5 %-1:200000 IJ SOLN
INTRAMUSCULAR | Status: AC
  Filled 2013-05-05: qty 1

## 2013-05-05 MED ORDER — LIDOCAINE HCL (CARDIAC) 20 MG/ML IV SOLN
INTRAVENOUS | Status: AC
  Filled 2013-05-05: qty 5

## 2013-05-05 MED ORDER — FLUOXETINE HCL 10 MG PO CAPS
10.0000 mg | ORAL_CAPSULE | Freq: Every day | ORAL | Status: DC
  Administered 2013-05-06 (×2): 10 mg via ORAL
  Filled 2013-05-05 (×2): qty 1

## 2013-05-05 MED ORDER — TRIAMTERENE-HCTZ 37.5-25 MG PO TABS
1.0000 | ORAL_TABLET | Freq: Every day | ORAL | Status: DC
  Administered 2013-05-06 (×2): 1 via ORAL
  Filled 2013-05-05 (×3): qty 1

## 2013-05-05 MED ORDER — ONDANSETRON HCL 4 MG/2ML IJ SOLN
INTRAMUSCULAR | Status: AC
  Filled 2013-05-05: qty 2

## 2013-05-05 MED ORDER — PROPOFOL 10 MG/ML IV BOLUS
INTRAVENOUS | Status: DC | PRN
  Administered 2013-05-05: 50 mg via INTRAVENOUS

## 2013-05-05 MED ORDER — SODIUM CHLORIDE 0.9 % IV SOLN
Freq: Once | INTRAVENOUS | Status: DC
  Filled 2013-05-05: qty 1

## 2013-05-05 MED ORDER — SODIUM CHLORIDE 0.9 % IR SOLN
Status: DC | PRN
  Administered 2013-05-05: 18:00:00

## 2013-05-05 MED ORDER — SCOPOLAMINE 1 MG/3DAYS TD PT72
MEDICATED_PATCH | TRANSDERMAL | Status: AC
  Filled 2013-05-05: qty 1

## 2013-05-05 MED ORDER — VANCOMYCIN HCL 10 G IV SOLR
1250.0000 mg | Freq: Two times a day (BID) | INTRAVENOUS | Status: DC
  Administered 2013-05-06 (×2): 1250 mg via INTRAVENOUS
  Filled 2013-05-05 (×3): qty 1250

## 2013-05-05 MED ORDER — HEPARIN SODIUM (PORCINE) 5000 UNIT/ML IJ SOLN
5000.0000 [IU] | Freq: Once | INTRAMUSCULAR | Status: AC
  Administered 2013-05-05: 5000 [IU] via SUBCUTANEOUS

## 2013-05-05 MED ORDER — LACTATED RINGERS IV SOLN
INTRAVENOUS | Status: DC
  Administered 2013-05-05: 15:00:00 via INTRAVENOUS

## 2013-05-05 MED ORDER — LIDOCAINE-EPINEPHRINE 0.5 %-1:200000 IJ SOLN
INTRAMUSCULAR | Status: DC | PRN
  Administered 2013-05-05: 14 mL

## 2013-05-05 MED ORDER — LACTATED RINGERS IV SOLN
INTRAVENOUS | Status: DC
  Administered 2013-05-06: 01:00:00 via INTRAVENOUS

## 2013-05-05 MED ORDER — MIDAZOLAM HCL 2 MG/2ML IJ SOLN
INTRAMUSCULAR | Status: AC
  Filled 2013-05-05: qty 2

## 2013-05-05 MED ORDER — PROMETHAZINE HCL 25 MG/ML IJ SOLN: 6.2500 mg | INTRAMUSCULAR | Status: DC | PRN

## 2013-05-05 MED ORDER — LACTATED RINGERS IV SOLN
INTRAVENOUS | Status: DC | PRN
  Administered 2013-05-05: 17:00:00 via INTRAVENOUS

## 2013-05-05 MED ORDER — FENTANYL CITRATE 0.05 MG/ML IJ SOLN
INTRAMUSCULAR | Status: DC | PRN
  Administered 2013-05-05 (×3): 50 ug via INTRAVENOUS
  Administered 2013-05-05: 100 ug via INTRAVENOUS

## 2013-05-05 MED ORDER — MIDAZOLAM HCL 5 MG/5ML IJ SOLN
INTRAMUSCULAR | Status: DC | PRN
  Administered 2013-05-05: 2 mg via INTRAVENOUS

## 2013-05-05 MED ORDER — PROPOFOL INFUSION 10 MG/ML OPTIME
INTRAVENOUS | Status: DC | PRN
  Administered 2013-05-05 (×2): 100 ug/kg/min via INTRAVENOUS

## 2013-05-05 MED ORDER — METOPROLOL TARTRATE 25 MG PO TABS
25.0000 mg | ORAL_TABLET | Freq: Two times a day (BID) | ORAL | Status: DC
  Administered 2013-05-05: 25 mg via ORAL
  Filled 2013-05-05 (×3): qty 1

## 2013-05-05 MED ORDER — HYDROMORPHONE HCL 2 MG PO TABS: 2.0000 mg | ORAL_TABLET | ORAL | Status: DC | PRN

## 2013-05-05 MED ORDER — FENTANYL CITRATE 0.05 MG/ML IJ SOLN
INTRAMUSCULAR | Status: AC
  Filled 2013-05-05: qty 5

## 2013-05-05 MED ORDER — ACETAMINOPHEN 325 MG PO TABS
325.0000 mg | ORAL_TABLET | Freq: Once | ORAL | Status: AC
  Administered 2013-05-05: 325 mg via ORAL

## 2013-05-05 MED ORDER — SCOPOLAMINE 1 MG/3DAYS TD PT72SCOPOLAMINE 1 MG/3DAYS
MEDICATED_PATCH | TRANSDERMAL | Status: DC | PRN
Start: 2013-05-05 — End: 2013-05-05
  Administered 2013-05-05: 1 via TRANSDERMAL

## 2013-05-05 MED ORDER — ONDANSETRON HCL 4 MG/2ML IJ SOLN
INTRAMUSCULAR | Status: DC | PRN
  Administered 2013-05-05: 4 mg via INTRAVENOUS

## 2013-05-05 MED ORDER — ACETAMINOPHEN 500 MG PO TABS
500.0000 mg | ORAL_TABLET | Freq: Four times a day (QID) | ORAL | Status: DC | PRN
  Administered 2013-05-06 (×3): 1000 mg via ORAL
  Filled 2013-05-05 (×3): qty 2

## 2013-05-05 MED ORDER — 0.9 % SODIUM CHLORIDE (POUR BTL) OPTIME
TOPICAL | Status: DC | PRN
  Administered 2013-05-05: 2000 mL

## 2013-05-05 SURGICAL SUPPLY — 60 items
ADH SKN CLS APL DERMABOND .7 (GAUZE/BANDAGES/DRESSINGS) ×1
APPLIER CLIP 9.375 MED OPEN (MISCELLANEOUS)
APR CLP MED 9.3 20 MLT OPN (MISCELLANEOUS)
ATCH SMKEVC FLXB CAUT HNDSWH (FILTER) ×1 IMPLANT
BAG DECANTER FOR FLEXI CONT (MISCELLANEOUS) ×3 IMPLANT
BINDER BREAST XXLRG (GAUZE/BANDAGES/DRESSINGS) ×2 IMPLANT
BIOPATCH RED 1 DISK 7.0 (GAUZE/BANDAGES/DRESSINGS) ×2 IMPLANT
BIOPATCH RED 1IN DISK 7.0MM (GAUZE/BANDAGES/DRESSINGS) ×2
CANISTER SUCTION 2500CC (MISCELLANEOUS) ×3 IMPLANT
CHLORAPREP W/TINT 26ML (MISCELLANEOUS) ×1 IMPLANT
CLIP APPLIE 9.375 MED OPEN (MISCELLANEOUS) IMPLANT
COVER SURGICAL LIGHT HANDLE (MISCELLANEOUS) ×3 IMPLANT
DERMABOND ADVANCED (GAUZE/BANDAGES/DRESSINGS) ×2
DERMABOND ADVANCED .7 DNX12 (GAUZE/BANDAGES/DRESSINGS) ×1 IMPLANT
DRAIN CHANNEL 19F RND (DRAIN) ×4 IMPLANT
DRAPE ORTHO SPLIT 77X108 STRL (DRAPES) ×6
DRAPE PROXIMA HALF (DRAPES) ×2 IMPLANT
DRAPE SURG 17X23 STRL (DRAPES) ×8 IMPLANT
DRAPE SURG ORHT 6 SPLT 77X108 (DRAPES) ×2 IMPLANT
DRAPE WARM FLUID 44X44 (DRAPE) ×1 IMPLANT
DRESSING TELFA 8X10 (GAUZE/BANDAGES/DRESSINGS) ×2 IMPLANT
DRSG PAD ABDOMINAL 8X10 ST (GAUZE/BANDAGES/DRESSINGS) ×4 IMPLANT
DRSG TEGADERM 4X4.75 (GAUZE/BANDAGES/DRESSINGS) ×2 IMPLANT
ELECT BLADE 6.5 EXT (BLADE) ×2 IMPLANT
ELECT CAUTERY BLADE 6.4 (BLADE) ×3 IMPLANT
ELECT REM PT RETURN 9FT ADLT (ELECTROSURGICAL) ×3
ELECTRODE REM PT RTRN 9FT ADLT (ELECTROSURGICAL) ×1 IMPLANT
EVACUATOR SILICONE 100CC (DRAIN) ×4 IMPLANT
EVACUATOR SMOKE ACCUVAC VALLEY (FILTER) ×2
GLOVE BIO SURGEON STRL SZ7.5 (GLOVE) ×3 IMPLANT
GLOVE BIOGEL PI IND STRL 8 (GLOVE) ×1 IMPLANT
GLOVE BIOGEL PI INDICATOR 8 (GLOVE) ×2
GOWN STRL NON-REIN LRG LVL3 (GOWN DISPOSABLE) ×5 IMPLANT
GOWN STRL REIN XL XLG (GOWN DISPOSABLE) ×3 IMPLANT
KIT BASIN OR (CUSTOM PROCEDURE TRAY) ×3 IMPLANT
KIT ROOM TURNOVER OR (KITS) ×3 IMPLANT
MARKER SKIN DUAL TIP RULER LAB (MISCELLANEOUS) ×3 IMPLANT
NDL HYPO 25GX1X1/2 BEV (NEEDLE) IMPLANT
NEEDLE HYPO 25GX1X1/2 BEV (NEEDLE) ×3 IMPLANT
NS IRRIG 1000ML POUR BTL (IV SOLUTION) ×6 IMPLANT
PACK GENERAL/GYN (CUSTOM PROCEDURE TRAY) ×3 IMPLANT
PAD ARMBOARD 7.5X6 YLW CONV (MISCELLANEOUS) ×3 IMPLANT
PREFILTER EVAC NS 1 1/3-3/8IN (MISCELLANEOUS) ×3 IMPLANT
SPONGE GAUZE 4X4 12PLY (GAUZE/BANDAGES/DRESSINGS) ×2 IMPLANT
SUT ETHIBOND 2 0 SH (SUTURE) IMPLANT
SUT MNCRL AB 3-0 PS2 18 (SUTURE) ×1 IMPLANT
SUT PDS 2 0 CTB 1 36 (SUTURE) IMPLANT
SUT PDS AB 3-0 SH 27 (SUTURE) IMPLANT
SUT PROLENE 2 0 CT 1 (SUTURE) ×4 IMPLANT
SUT PROLENE 3 0 PS 2 (SUTURE) ×4 IMPLANT
SUT VIC AB 3-0 SH 18 (SUTURE) ×1 IMPLANT
SWAB COLLECTION DEVICE MRSA (MISCELLANEOUS) ×2 IMPLANT
SYR BULB IRRIGATION 50ML (SYRINGE) ×3 IMPLANT
SYR CONTROL 10ML LL (SYRINGE) ×2 IMPLANT
TOWEL OR 17X24 6PK STRL BLUE (TOWEL DISPOSABLE) ×3 IMPLANT
TOWEL OR 17X26 10 PK STRL BLUE (TOWEL DISPOSABLE) ×3 IMPLANT
TRAY FOLEY CATH 14FRSI W/METER (CATHETERS) IMPLANT
TUBE ANAEROBIC SPECIMEN COL (MISCELLANEOUS) ×2 IMPLANT
TUBE CONNECTING 12'X1/4 (SUCTIONS) ×2
TUBE CONNECTING 12X1/4 (SUCTIONS) ×3 IMPLANT

## 2013-05-05 NOTE — Brief Op Note (Signed)
05/05/2013  6:41 PM  PATIENT:  [name]  63 y.o. female  PRE-OPERATIVE DIAGNOSIS:  BREAST CANCER   POST-OPERATIVE DIAGNOSIS:  breast cancer  PROCEDURE:  Procedure(s): REMOVAL OF LEFT BREAST TISSUE EXPANDER / incision and drainage of ceroma (Left)  SURGEON:  Surgeon(s) and Role:    * Crissie Reese, MD - Primary  PHYSICIAN ASSISTANT:   ASSISTANTS: None   ANESTHESIA:   IV sedation  EBL:  Total I/O In: 700 [I.V.:700] Out: 200 [Blood:200]  BLOOD ADMINISTERED:none  DRAINS: (2) Jackson-Pratt drain(s) with closed bulb suction in the left chest   LOCAL MEDICATIONS USED:  LIDOCAINE   SPECIMEN:  No Specimen  DISPOSITION OF SPECIMEN:  N/A  COUNTS:  YES  TOURNIQUET:  * No tourniquets in log *  DICTATION: .Other Dictation: Dictation Number [id number]  PLAN OF CARE: Admit for overnight observation  PATIENT DISPOSITION:  PACU - hemodynamically stable.   Delay start of Pharmacological VTE agent (>24hrs) due to surgical blood loss or risk of bleeding: not applicable

## 2013-05-05 NOTE — Anesthesia Preprocedure Evaluation (Addendum)
Anesthesia Evaluation  Patient identified by MRN, date of birth, ID band Patient awake    Reviewed: Allergy & Precautions, H&P , NPO status , Patient's Chart, lab work & pertinent test results  Airway       Dental  (+) Edentulous Upper, Partial Lower   Pulmonary neg pulmonary ROS, former smoker,  Patient denies hx. Of asthma   Pulmonary exam normal       Cardiovascular hypertension, Pt. on medications and Pt. on home beta blockers Rhythm:Regular     Neuro/Psych negative psych ROS   GI/Hepatic negative GI ROS, Neg liver ROS,   Endo/Other  negative endocrine ROS  Renal/GU negative Renal ROS     Musculoskeletal   Abdominal   Peds  Hematology negative hematology ROS (+)   Anesthesia Other Findings   Reproductive/Obstetrics                          Anesthesia Physical Anesthesia Plan  ASA: II  Anesthesia Plan: MAC   Post-op Pain Management:    Induction:   Airway Management Planned: Mask  Additional Equipment:   Intra-op Plan:   Post-operative Plan:   Informed Consent: I have reviewed the patients History and Physical, chart, labs and discussed the procedure including the risks, benefits and alternatives for the proposed anesthesia with the patient or authorized representative who has indicated his/her understanding and acceptance.   Dental advisory given  Plan Discussed with:   Anesthesia Plan Comments:         Anesthesia Quick Evaluation

## 2013-05-05 NOTE — H&P (Signed)
I have re-examined and re-evaluated the patient and there are no changes. See office notes in paper chart.  Planned Procedure: Removal of left tissue expander.

## 2013-05-05 NOTE — Progress Notes (Signed)
Care of pt assumed by MA Savan Ruta RN 

## 2013-05-05 NOTE — Transfer of Care (Signed)
Immediate Anesthesia Transfer of Care Note  Patient: [name]  Procedure(s) Performed: Procedure(s): REMOVAL OF LEFT BREAST TISSUE EXPANDER / incision and drainage of ceroma (Left)  Patient Location: PACU  Anesthesia Type:MAC  Level of Consciousness: awake, alert , oriented and patient cooperative  Airway & Oxygen Therapy: Patient Spontanous Breathing  Post-op Assessment: Report given to PACU RN, Post -op Vital signs reviewed and stable and Patient moving all extremities  Post vital signs: Reviewed and stable  Complications: No apparent anesthesia complications

## 2013-05-05 NOTE — Preoperative (Signed)
Beta Blockers   Reason not to administer Beta Blockers:Pt. took     BB today

## 2013-05-05 NOTE — Anesthesia Postprocedure Evaluation (Signed)
Anesthesia Post Note  Patient: [name]  Procedure(s) Performed: Procedure(s) (LRB): REMOVAL OF LEFT BREAST TISSUE EXPANDER / incision and drainage of ceroma (Left)  Anesthesia type: MAC  Patient location: PACU  Post pain: Pain level controlled and Adequate analgesia  Post assessment: Post-op Vital signs reviewed, Patient's Cardiovascular Status Stable and Respiratory Function Stable  Last Vitals:  Filed Vitals:   05/05/13 1940  BP:   Pulse: 88  Temp:   Resp: 18    Post vital signs: Reviewed and stable  Level of consciousness: awake, alert  and oriented  Complications: No apparent anesthesia complications

## 2013-05-06 ENCOUNTER — Encounter (HOSPITAL_COMMUNITY): Payer: Self-pay | Admitting: Plastic Surgery

## 2013-05-06 NOTE — Progress Notes (Signed)
ANTIBIOTIC CONSULT NOTE - INITIAL  Pharmacy Consult:  Vancomycin Indication:  Cellulitis  Allergies  Allergen Reactions  . Ace Inhibitors Cough  . Codeine Itching and Nausea Only    Patient Measurements: Height: 5\' 3"  (160 cm) Weight: 236 lb 6.4 oz (107.23 kg) IBW/kg (Calculated) : 52.4  Vital Signs: Temp: 97.6 F (36.4 C) (03/06 1033) Temp src: Oral (03/06 1033) BP: 109/62 mmHg (03/06 1033) Pulse Rate: 80 (03/06 1033) Intake/Output from previous day: 03/05 0701 - 03/06 0700 In: 2210 [P.O.:360; I.V.:1600; IV Piggyback:250] Out: 643 [Urine:350; Drains:93; Blood:200] Intake/Output from this shift: Total I/O In: 160 [P.O.:160] Out: -   Labs:  Recent Labs  05/04/13 1428  WBC 12.4*  HGB 12.5  PLT 328  CREATININE 0.78   Estimated Creatinine Clearance: 85.5 ml/min (by C-G formula based on Cr of 0.78). No results found for this basename: VANCOTROUGH, Corlis Leak, VANCORANDOM, GENTTROUGH, GENTPEAK, GENTRANDOM, TOBRATROUGH, TOBRAPEAK, TOBRARND, AMIKACINPEAK, AMIKACINTROU, AMIKACIN,  in the last 72 hours   Microbiology: Recent Results (from the past 720 hour(s))  ANAEROBIC CULTURE     Status: None   Collection Time    05/05/13  5:25 PM      Result Value Ref Range Status   Specimen Description WOUND BREAST LEFT   Final   Special Requests PT ON VANCO   Final   Gram Stain     Final   Value: MODERATE WBC PRESENT, PREDOMINANTLY PMN     NO SQUAMOUS EPITHELIAL CELLS SEEN     NO ORGANISMS SEEN     Performed at [facility]   Culture PENDING   Incomplete   Report Status PENDING   Incomplete  WOUND CULTURE     Status: None   Collection Time    05/05/13  5:25 PM      Result Value Ref Range Status   Specimen Description WOUND BREAST LEFT   Final   Special Requests PT ON VANCO   Final   Gram Stain     Final   Value: MODERATE WBC PRESENT,BOTH PMN AND MONONUCLEAR     NO SQUAMOUS EPITHELIAL CELLS SEEN     NO ORGANISMS SEEN     Performed at [facility]   Performed at [facility]   Culture     Final   Value: NO GROWTH     Performed at [facility]   Report Status PENDING   Incomplete  GRAM STAIN     Status: None   Collection Time    05/05/13  5:25 PM      Result Value Ref Range Status   Specimen Description WOUND BREAST LEFT   Final   Special Requests PT ON VANCO   Final   Gram Stain     Final   Value: MODERATE WBC PRESENT,BOTH PMN AND MONONUCLEAR     NO ORGANISMS SEEN   Report Status 05/05/2013 FINAL   Final      Assessment: 71 YOF with history of breast cancer admitted for removal of left tissue expander and I&D of ceroma.  Pharmacy consulted to manage vancomycin for cellulitis.  Her renal function is stable.  3/5 left breast wound cx - NGTD   Goal of Therapy:  Vancomycin trough level 10-15 mcg/ml   Plan:  - Vanc 1250mg  IV Q12H - Monitor renal fxn, clinical course, vanc trough if indicated    Janica Eldred D. Mina Marble, PharmD, BCPS Pager:  [phone] 05/06/2013, 11:14 AM

## 2013-05-06 NOTE — Progress Notes (Signed)
Discharge instructions explained to pt.  Pt. Verbalized understanding of all orders/instructions.  Dressing changed and drains emptied.  IV removed. Pt. In stable condition waiting for friends to pick her up. Syliva Overman

## 2013-05-06 NOTE — Discharge Instructions (Addendum)
No lifting for 6 weeks No vigorous activity for 6 weeks (including outdoor walks) No driving for 4 weeks OK to walk up stairs slowly Stay propped up Use incentive spirometer at home every hour while awake No shower while drains are in place Empty drains at least three times a day and record the amounts separately Take an over-the-counter Probiotic while on antibiotics Take an over-the-counter stool softener (such as Colace) while on pain medication Call the office on Monday and set up appointment for end of the week. For questions call [phone] or [phone]. Resume the antibiotic you have been taking at home. Call Dr. Harlow Mares [phone] to confirm it is Doxycycline.

## 2013-05-06 NOTE — Discharge Summary (Signed)
Physician Discharge Summary  Patient ID: [name] MRN: [id number] DOB/AGE: March 04, 1950 63 y.o.  Admit date: 05/05/2013 Discharge date: 05/06/2013  Admission Diagnoses:Breast cancer and seroma left chest status post mastectomy and breast reconstruction with tissue expander  Discharge Diagnoses:Same  Active Problems:   Seroma, postoperative   Discharged Condition: good  Hospital Course: On the day of admission the patient was taken to surgery and had removal of left chest tissue expander and drainage of seroma. The patient tolerated the procedures well. Postoperatively, the skin looked good. Erythema is essentially resolved on first post-op day. The patient was ambulatory and tolerating diet on the first postoperative day.  Significant Diagnostic Studies: labs: WBC elevated slightly pre-op qt 12,400.  Treatments: antibiotics: vancomycin, anticoagulation: none and surgery: removal left tissue expander and drainage of seroma.  Discharge Exam: Blood pressure 116/48, pulse 98, temperature 98.5 F (36.9 C), temperature source Oral, resp. rate 16, height 5\' 3"  (1.6 m), weight 236 lb 6.4 oz (107.23 kg), SpO2 95.00%.  Operative sites: Mastectomy flaps look good. Erythema is essentially resolved. Incision looks good. Healing well. Drains functioning. Drainage thin and non-purulent.  Disposition: 01-Home or Self Care  Discharge Orders   Future Appointments Provider Department Dept Phone   06/06/2013 1:30 PM Merrie Roof, MD [facility], [place] [phone]   Future Orders Complete By Expires   Nursing communication  As directed    Scheduling Instructions:     Change the wound dressing prior to discharge. Use Telfa over the sutures and cover that with a layer of 3 ABDs. One ABD over the right chest and then place the breast binder.       Medication List    STOP taking these medications       naproxen sodium 220 MG tablet  Commonly known as:  ANAPROX     neomycin-bacitracin-polymyxin ointment  Commonly known as:  NEOSPORIN     OVER THE COUNTER MEDICATION      TAKE these medications       acetaminophen 500 MG tablet  Commonly known as:  TYLENOL  Take 500-1,000 mg by mouth every 6 (six) hours as needed for mild pain.     doxycycline 100 MG capsule  Commonly known as:  MONODOX  Take 100 mg by mouth 2 (two) times daily.     FLUoxetine 10 MG capsule  Commonly known as:  PROZAC  Take 10 mg by mouth daily.     metoprolol 50 MG tablet  Commonly known as:  LOPRESSOR  Take 25 mg by mouth 2 (two) times daily.     neomycin-polymyxin b-dexamethasone 3.5-10000-0.1 Susp  Commonly known as:  MAXITROL  Place 1 drop into the left eye every 4 (four) hours.     ondansetron 4 MG tablet  Commonly known as:  ZOFRAN  Take 4 mg by mouth every 8 (eight) hours as needed for nausea or vomiting (after Chemo treatments).     triamterene-hydrochlorothiazide 37.5-25 MG per tablet  Commonly known as:  MAXZIDE-25  Take 1 tablet by mouth daily.         SignedHarlow Mares, Tirrell Buchberger M 05/06/2013, 4:13 PM

## 2013-05-06 NOTE — Op Note (Signed)
NAMEGLORIA, [name]         ACCOUNT NO.:  [id number]  MEDICAL RECORD NO.:  [id number]  LOCATION:  6N23C                        FACILITY:  [facility]  PHYSICIAN:  Crissie Reese, M.D.     DATE OF BIRTH:  [birth year]  DATE OF PROCEDURE:  05/05/2013 DATE OF DISCHARGE:                              OPERATIVE REPORT   PREOPERATIVE DIAGNOSIS:  Possible infected left tissue expander.  POSTOPERATIVE DIAGNOSIS:  Possible infected left tissue expander.  PROCEDURE:  Incision and drainage of seroma of the left chest, removal of tissue expander.  SURGEON:  Crissie Reese, MD  ANESTHESIA:  Sedation with 0.5% Xylocaine with 1:100,000 epinephrine.  CLINICAL NOTE:  A 63 year old woman had breast cancer and bilateral mastectomy with reconstruction with tissue expanders 8 weeks ago.  She had done very well and had begun chemotherapy and developed acute cholecystitis and then had her gallbladder removed about a week ago. She developed erythema of the left chest about that same time, and this has not responded to p.o. antibiotics.  She is here today for removal of tissue expander due to a possible infection.  Certainly, she appears to have a seroma.  The nature of the procedure and risks plus complications were discussed with her in detail.  She understood that we would wait on any further reconstruction for at least 6 months and certainly after all other treatments had been completed for her breast cancer.  The risks that were discussed with her include but not limited to bleeding, infection, healing problems, scarring, loss of sensation, fluid accumulations, anesthesia related complications, DVT, PE, pneumothorax, and contour deformities, disappointment, and chronic pain.  She understood all this and wished to proceed.  It was her decision to go ahead with a removal of the tissue expander.  An attempted exchange of tissue expander was discussed with her that it was my feeling as well as hers it was  best just to simply remove it and get her back to chemotherapy just as soon as possible given this interruption that she had beginning with the cholecystectomy.  DESCRIPTION OF PROCEDURE:  The patient was taken to the operating room and placed supine.  After satisfactory sedation, she was prepped with Betadine and draped with sterile drapes.  After successful level of anesthesia with 0.5% Xylocaine and 1:200,000 epinephrine, the old mastectomy scar was opened and dissection carried down to the muscle. The submuscular space with tissue expander encountered.  Cultures were taken.  There was a seroma.  The seroma was drained.  Tissue expander was deflated and removed.  Curetting was performed.  There was noted to be a connection from the submuscular space to the axilla and it appeared that this all originated with the seroma in the axilla postmastectomy. All of this having been thoroughly washed and cleansed and antibiotic solution was placed in order to dwell on the space for approximately 5 minutes.  Two 19-French drains were positioned and brought through separate stab wounds inferolaterally and secured with 3-0 Prolene sutures.  One of these drained the submuscular space and the other entered the space back up into the axilla.  These were secured with 3-0 Prolene sutures.  The closure with 2-0 Prolene interrupted simple and vertical mattress sutures as  needed.  Care was taken to avoid deep suture material in this setting associated with cellulitis.  Gram stains were returned and revealed white blood cells, numerous PMNs but no organisms seen.  Cultures were taken and they are pending.  She was transferred to the recovery room in stable having tolerated the procedure well.     Crissie Reese, M.D.     DB/MEDQ  D:  05/05/2013  T:  05/06/2013  Job:  [id number]

## 2013-05-08 LAB — WOUND CULTURE: Culture: NO GROWTH

## 2013-05-10 LAB — ANAEROBIC CULTURE

## 2013-06-06 ENCOUNTER — Encounter (INDEPENDENT_AMBULATORY_CARE_PROVIDER_SITE_OTHER): Payer: BC Managed Care – PPO | Admitting: General Surgery

## 2013-06-06 ENCOUNTER — Other Ambulatory Visit (INDEPENDENT_AMBULATORY_CARE_PROVIDER_SITE_OTHER): Payer: Self-pay

## 2013-06-06 MED ORDER — UNABLE TO FIND: Status: DC

## 2013-06-20 ENCOUNTER — Encounter (INDEPENDENT_AMBULATORY_CARE_PROVIDER_SITE_OTHER): Payer: Self-pay | Admitting: General Surgery

## 2013-06-20 ENCOUNTER — Encounter (INDEPENDENT_AMBULATORY_CARE_PROVIDER_SITE_OTHER): Payer: BC Managed Care – PPO | Admitting: General Surgery

## 2013-06-20 ENCOUNTER — Ambulatory Visit (INDEPENDENT_AMBULATORY_CARE_PROVIDER_SITE_OTHER): Payer: BC Managed Care – PPO | Admitting: General Surgery

## 2013-06-20 VITALS — BP 136/80 | HR 74 | Resp 14 | Ht 64.0 in | Wt 226.6 lb

## 2013-06-20 DIAGNOSIS — C50911 Malignant neoplasm of unspecified site of right female breast: Secondary | ICD-10-CM

## 2013-06-20 DIAGNOSIS — C50919 Malignant neoplasm of unspecified site of unspecified female breast: Secondary | ICD-10-CM

## 2013-06-20 NOTE — Progress Notes (Signed)
Subjective:     Patient ID: [name], female   DOB: Jan 10, 1951, 63 y.o.   MRN: [id number]  HPI The patient is a 63 year old white female who is 3 months status post bilateral mastectomies, right sentinel lymph node biopsy, and reconstruction for a T1 C. N0 right breast cancer. She was ER and PR negative and HER-2 positive. She is still receiving chemotherapy. She is tolerating it well. Unfortunate she had to have the left-sided tissue expander removed secondary to some cellulitis. She is doing well now and is back to work.  Review of Systems  Constitutional: Negative.   HENT: Negative.   Eyes: Negative.   Respiratory: Negative.   Cardiovascular: Negative.   Gastrointestinal: Negative.   Endocrine: Negative.   Genitourinary: Negative.   Musculoskeletal: Negative.   Skin: Negative.   Allergic/Immunologic: Negative.   Neurological: Negative.   Hematological: Negative.   Psychiatric/Behavioral: Negative.        Objective:   Physical Exam  Constitutional: She is oriented to person, place, and time. She appears well-developed and well-nourished.  HENT:  Head: Normocephalic and atraumatic.  Eyes: Conjunctivae and EOM are normal. Pupils are equal, round, and reactive to light.  Neck: Normal range of motion. Neck supple.  Cardiovascular: Normal rate, regular rhythm and normal heart sounds.   Pulmonary/Chest: Effort normal and breath sounds normal.  The cellulitis of the left chest wall has resolved. There is no palpable mass of the left chest wall. There is no palpable mass in the reconstructed right breast. There is no palpable axillary, supraclavicular, or cervical lymphadenopathy  Abdominal: Soft. Bowel sounds are normal.  Musculoskeletal: Normal range of motion.  Lymphadenopathy:    She has no cervical adenopathy.  Neurological: She is alert and oriented to person, place, and time.  Skin: Skin is warm and dry.  Psychiatric: She has a normal mood and affect. Her behavior  is normal.       Assessment:     The patient is 3 months status post bilateral mastectomies for right-sided breast cancer     Plan:     At this point she will continue her chemotherapy treatment. She will continue to do regular self exams. I will plan to see her back in about 3 months.

## 2013-10-03 ENCOUNTER — Ambulatory Visit (INDEPENDENT_AMBULATORY_CARE_PROVIDER_SITE_OTHER): Payer: BC Managed Care – PPO | Admitting: General Surgery

## 2013-10-03 ENCOUNTER — Encounter (INDEPENDENT_AMBULATORY_CARE_PROVIDER_SITE_OTHER): Payer: Self-pay | Admitting: General Surgery

## 2013-10-03 VITALS — BP 140/80 | HR 72 | Resp 16 | Ht 64.0 in | Wt 234.0 lb

## 2013-10-03 DIAGNOSIS — C50919 Malignant neoplasm of unspecified site of unspecified female breast: Secondary | ICD-10-CM

## 2013-10-03 DIAGNOSIS — C50911 Malignant neoplasm of unspecified site of right female breast: Secondary | ICD-10-CM

## 2013-10-03 NOTE — Progress Notes (Signed)
Subjective:     Patient ID: [name], female   DOB: 1950/08/23, 63 y.o.   MRN: [id number]  HPI The patient is a 63 year old white female who is 6 months status post bilateral mastectomies and right sentinel lymph node biopsy for a T1 C N0 right breast cancer. She was ER and PR negative and HER-2 positive. She is still receiving Herceptin but is done with the regular chemotherapy. She had reconstruction done by Dr. Harlow Mares but had to have the left tissue expander removed. She is meeting with him this afternoon to talk about putting it back in. She otherwise feels great and is back to work.  Review of Systems  Constitutional: Negative.   HENT: Negative.   Eyes: Negative.   Respiratory: Negative.   Cardiovascular: Negative.   Gastrointestinal: Negative.   Endocrine: Negative.   Genitourinary: Negative.   Musculoskeletal: Negative.   Skin: Negative.   Allergic/Immunologic: Negative.   Neurological: Negative.   Hematological: Negative.   Psychiatric/Behavioral: Negative.        Objective:   Physical Exam  Constitutional: She is oriented to person, place, and time. She appears well-developed and well-nourished.  HENT:  Head: Normocephalic and atraumatic.  Eyes: Conjunctivae and EOM are normal. Pupils are equal, round, and reactive to light.  Neck: Normal range of motion. Neck supple.  Cardiovascular: Normal rate, regular rhythm and normal heart sounds.   Pulmonary/Chest: Effort normal and breath sounds normal.  Her mastectomy incisions are healing nicely. There is no palpable mass of the left chest wall. There is no palpable mass in the reconstructed right breast. There is no palpable axillary, supraclavicular, or cervical lymphadenopathy.  Abdominal: Soft. Bowel sounds are normal.  Musculoskeletal: Normal range of motion.  Lymphadenopathy:    She has no cervical adenopathy.  Neurological: She is alert and oriented to person, place, and time.  Skin: Skin is warm and dry.   Psychiatric: She has a normal mood and affect. Her behavior is normal.       Assessment:     The patient is 6 months status post bilateral mastectomies for right-sided breast cancer.     Plan:     At this point she will continue with Herceptin therapy. She will be meeting with Dr. Harlow Mares this afternoon to talk about replacing the tissue expander on the left. She will continue to do regular self exams. I will plan to see her back in about 6 months.

## 2013-10-03 NOTE — Patient Instructions (Signed)
Continue regular self exams  

## 2013-10-19 ENCOUNTER — Other Ambulatory Visit: Payer: Self-pay | Admitting: Internal Medicine

## 2013-10-19 NOTE — Telephone Encounter (Signed)
Refilled for 90 days.  Pt should return in Oct for yearly exam.  Sent a letter as a reminder.

## 2013-11-17 ENCOUNTER — Encounter (HOSPITAL_COMMUNITY): Payer: Self-pay | Admitting: Pharmacy Technician

## 2013-11-18 NOTE — Pre-Procedure Instructions (Addendum)
Deleah Tison  11/18/2013   Your procedure is scheduled on:  Thursday, October 1.  Report to [facility] Admitting at 9:00 AM.  Call this number if you have problems the morning of surgery: [phone]   Remember:   Do not eat food or drink liquids after midnight Wednesday, September 30   Take these medicines the morning of surgery with A SIP OF WATER: FLUoxetine (PROZAC), metoprolol (LOPRESSOR).               Take if needed: acetaminophen (TYLENOL).   Do not wear jewelry, make-up or nail polish.  Do not wear lotions, powders, or perfumes.   Do not shave 48 hours prior to surgery.              Men may shave face and neck.  Do not bring valuables to the hospital.             [facility] is not responsible for any belongings or valuables.               Contacts, dentures or bridgework may not be worn into surgery.  Leave suitcase in the car. After surgery it may be brought to your room.  For patients admitted to the hospital, discharge time is determined by your treatment team.               Patients discharged the day of surgery will not be allowed to drive home.  Name and phone number of your driver: -   Special Instructions: Review  [facility] - Preparing For Surgery.   Please read over the following fact sheets that you were given: Pain Booklet, Coughing and Deep Breathing and Surgical Site Infection Prevention

## 2013-11-21 ENCOUNTER — Encounter (HOSPITAL_COMMUNITY)
Admission: RE | Admit: 2013-11-21 | Discharge: 2013-11-21 | Disposition: A | Discharge status: Home / Self Care | Payer: BC Managed Care – PPO | Source: Ambulatory Visit | Attending: Plastic Surgery | Admitting: Plastic Surgery

## 2013-11-21 DIAGNOSIS — Z01812 Encounter for preprocedural laboratory examination: Secondary | ICD-10-CM | POA: Insufficient documentation

## 2013-11-21 DIAGNOSIS — C50919 Malignant neoplasm of unspecified site of unspecified female breast: Secondary | ICD-10-CM | POA: Diagnosis not present

## 2013-11-21 LAB — BASIC METABOLIC PANEL
Anion gap: 12 (ref 5–15)
BUN: 16 mg/dL (ref 6–23)
CO2: 28 meq/L (ref 19–32)
CREATININE: 0.81 mg/dL (ref 0.50–1.10)
Calcium: 9.6 mg/dL (ref 8.4–10.5)
Chloride: 99 mEq/L (ref 96–112)
GFR calc Af Amer: 88 mL/min — ABNORMAL LOW (ref >90)
GFR calc non Af Amer: 76 mL/min — ABNORMAL LOW (ref >90)
GLUCOSE: 204 mg/dL — AB (ref 70–99)
Potassium: 4.6 mEq/L (ref 3.7–5.3)
SODIUM: 139 meq/L (ref 137–147)

## 2013-11-21 LAB — CBC
HEMATOCRIT: 42.1 % (ref 36.0–46.0)
Hemoglobin: 14.1 g/dL (ref 12.0–15.0)
MCH: 28.2 pg (ref 26.0–34.0)
MCHC: 33.5 g/dL (ref 30.0–36.0)
MCV: 84.2 fL (ref 78.0–100.0)
PLATELETS: 141 10*3/uL — AB (ref 150–400)
RBC: 5 MIL/uL (ref 3.87–5.11)
RDW: 14.2 % (ref 11.5–15.5)
WBC: 5.8 10*3/uL (ref 4.0–10.5)

## 2013-11-21 NOTE — Progress Notes (Signed)
This patient scored at an elevated risk for obstructive sleep apnea using the STOP BANG TOOL during a pre surgical testing 

## 2013-11-28 ENCOUNTER — Other Ambulatory Visit: Payer: Self-pay | Admitting: Plastic Surgery

## 2013-11-30 MED ORDER — CHLORHEXIDINE GLUCONATE 4 % EX LIQD
1.0000 "application " | Freq: Once | CUTANEOUS | Status: DC
  Filled 2013-11-30: qty 15

## 2013-11-30 MED ORDER — CEFAZOLIN SODIUM-DEXTROSE 2-3 GM-% IV SOLR
2.0000 g | INTRAVENOUS | Status: AC
  Administered 2013-12-01: 2 g via INTRAVENOUS
  Filled 2013-11-30: qty 50

## 2013-11-30 MED ORDER — HEPARIN SODIUM (PORCINE) 5000 UNIT/ML IJ SOLN
5000.0000 [IU] | Freq: Once | INTRAMUSCULAR | Status: AC
  Administered 2013-12-01: 5000 [IU] via SUBCUTANEOUS
  Filled 2013-11-30: qty 1

## 2013-12-01 ENCOUNTER — Ambulatory Visit (HOSPITAL_COMMUNITY)
Admission: RE | Admit: 2013-12-01 | Discharge: 2013-12-02 | Disposition: A | Discharge status: Home / Self Care | Payer: BC Managed Care – PPO | Source: Ambulatory Visit | Attending: Plastic Surgery | Admitting: Plastic Surgery

## 2013-12-01 ENCOUNTER — Encounter (HOSPITAL_COMMUNITY): Payer: Self-pay | Admitting: Anesthesiology

## 2013-12-01 ENCOUNTER — Encounter (HOSPITAL_COMMUNITY)
Admission: RE | Disposition: A | Discharge status: Home / Self Care | Payer: Self-pay | Source: Ambulatory Visit | Attending: Plastic Surgery

## 2013-12-01 ENCOUNTER — Encounter (HOSPITAL_COMMUNITY): Payer: BC Managed Care – PPO | Admitting: Anesthesiology

## 2013-12-01 ENCOUNTER — Ambulatory Visit (HOSPITAL_COMMUNITY): Payer: BC Managed Care – PPO | Admitting: Anesthesiology

## 2013-12-01 DIAGNOSIS — Z421 Encounter for breast reconstruction following mastectomy: Secondary | ICD-10-CM | POA: Insufficient documentation

## 2013-12-01 DIAGNOSIS — J45909 Unspecified asthma, uncomplicated: Secondary | ICD-10-CM | POA: Insufficient documentation

## 2013-12-01 DIAGNOSIS — Z87891 Personal history of nicotine dependence: Secondary | ICD-10-CM | POA: Insufficient documentation

## 2013-12-01 DIAGNOSIS — Z853 Personal history of malignant neoplasm of breast: Secondary | ICD-10-CM | POA: Diagnosis not present

## 2013-12-01 DIAGNOSIS — Z6841 Body Mass Index (BMI) 40.0 and over, adult: Secondary | ICD-10-CM | POA: Diagnosis not present

## 2013-12-01 DIAGNOSIS — K219 Gastro-esophageal reflux disease without esophagitis: Secondary | ICD-10-CM | POA: Diagnosis not present

## 2013-12-01 DIAGNOSIS — Z9013 Acquired absence of bilateral breasts and nipples: Secondary | ICD-10-CM | POA: Insufficient documentation

## 2013-12-01 DIAGNOSIS — F4323 Adjustment disorder with mixed anxiety and depressed mood: Secondary | ICD-10-CM | POA: Diagnosis not present

## 2013-12-01 DIAGNOSIS — I10 Essential (primary) hypertension: Secondary | ICD-10-CM | POA: Diagnosis not present

## 2013-12-01 HISTORY — PX: TISSUE EXPANDER PLACEMENT: SHX2530

## 2013-12-01 HISTORY — PX: BREAST RECONSTRUCTION: SHX9

## 2013-12-01 SURGERY — INSERTION, TISSUE EXPANDER
Anesthesia: General | Site: Breast | Laterality: Left

## 2013-12-01 MED ORDER — HYDROMORPHONE HCL 2 MG PO TABS: 2.0000 mg | ORAL_TABLET | ORAL | Status: DC | PRN

## 2013-12-01 MED ORDER — SODIUM CHLORIDE 0.9 % IJ SOLN
INTRAMUSCULAR | Status: AC
  Filled 2013-12-01: qty 10

## 2013-12-01 MED ORDER — HYDROMORPHONE HCL 1 MG/ML IJ SOLN: 0.2500 mg | INTRAMUSCULAR | Status: DC | PRN

## 2013-12-01 MED ORDER — INFLUENZA VAC SPLIT QUAD 0.5 ML IM SUSY
0.5000 mL | PREFILLED_SYRINGE | INTRAMUSCULAR | Status: AC
  Administered 2013-12-02: 0.5 mL via INTRAMUSCULAR
  Filled 2013-12-01: qty 0.5

## 2013-12-01 MED ORDER — ROCURONIUM BROMIDE 50 MG/5ML IV SOLN
INTRAVENOUS | Status: AC
  Filled 2013-12-01: qty 1

## 2013-12-01 MED ORDER — DEXAMETHASONE SODIUM PHOSPHATE 4 MG/ML IJ SOLN
INTRAMUSCULAR | Status: DC | PRN
  Administered 2013-12-01: 8 mg via INTRAVENOUS

## 2013-12-01 MED ORDER — ACETAMINOPHEN 500 MG PO TABS
500.0000 mg | ORAL_TABLET | Freq: Four times a day (QID) | ORAL | Status: DC | PRN
  Administered 2013-12-01 – 2013-12-02 (×2): 1000 mg via ORAL
  Filled 2013-12-01 (×2): qty 2

## 2013-12-01 MED ORDER — DEXAMETHASONE SODIUM PHOSPHATE 4 MG/ML IJ SOLN
INTRAMUSCULAR | Status: AC
  Filled 2013-12-01: qty 2

## 2013-12-01 MED ORDER — ROCURONIUM BROMIDE 100 MG/10ML IV SOLN
INTRAVENOUS | Status: DC | PRN
  Administered 2013-12-01: 50 mg via INTRAVENOUS

## 2013-12-01 MED ORDER — 0.9 % SODIUM CHLORIDE (POUR BTL) OPTIME
TOPICAL | Status: DC | PRN
  Administered 2013-12-01 (×2): 1000 mL

## 2013-12-01 MED ORDER — PROPOFOL 10 MG/ML IV BOLUS
INTRAVENOUS | Status: AC
  Filled 2013-12-01: qty 20

## 2013-12-01 MED ORDER — CEFAZOLIN SODIUM 1-5 GM-% IV SOLN
1.0000 g | Freq: Four times a day (QID) | INTRAVENOUS | Status: DC
  Administered 2013-12-01 – 2013-12-02 (×4): 1 g via INTRAVENOUS
  Filled 2013-12-01 (×6): qty 50

## 2013-12-01 MED ORDER — MEPERIDINE HCL 25 MG/ML IJ SOLN: 6.2500 mg | INTRAMUSCULAR | Status: DC | PRN

## 2013-12-01 MED ORDER — EPHEDRINE SULFATE 50 MG/ML IJ SOLN
INTRAMUSCULAR | Status: DC | PRN
  Administered 2013-12-01: 5 mg via INTRAVENOUS
  Administered 2013-12-01 (×2): 10 mg via INTRAVENOUS

## 2013-12-01 MED ORDER — PROPOFOL 10 MG/ML IV BOLUS
INTRAVENOUS | Status: DC | PRN
Start: 2013-12-01 — End: 2013-12-01
  Administered 2013-12-01 (×3): 20 mg via INTRAVENOUS
  Administered 2013-12-01: 130 mg via INTRAVENOUS
  Administered 2013-12-01: 30 mg via INTRAVENOUS

## 2013-12-01 MED ORDER — DEXTROSE-NACL 5-0.45 % IV SOLN
INTRAVENOUS | Status: DC
  Administered 2013-12-01 – 2013-12-02 (×3): via INTRAVENOUS

## 2013-12-01 MED ORDER — FENTANYL CITRATE 0.05 MG/ML IJ SOLN
INTRAMUSCULAR | Status: DC | PRN
  Administered 2013-12-01: 100 ug via INTRAVENOUS
  Administered 2013-12-01 (×2): 50 ug via INTRAVENOUS

## 2013-12-01 MED ORDER — HEPARIN SODIUM (PORCINE) 5000 UNIT/ML IJ SOLN
5000.0000 [IU] | Freq: Three times a day (TID) | INTRAMUSCULAR | Status: DC
  Administered 2013-12-01 – 2013-12-02 (×3): 5000 [IU] via SUBCUTANEOUS
  Filled 2013-12-01 (×6): qty 1

## 2013-12-01 MED ORDER — SODIUM CHLORIDE 0.9 % IV SOLN
INTRAVENOUS | Status: DC | PRN
  Administered 2013-12-01: 1000 mL via INTRAMUSCULAR

## 2013-12-01 MED ORDER — MIDAZOLAM HCL 5 MG/5ML IJ SOLN
INTRAMUSCULAR | Status: DC | PRN
  Administered 2013-12-01: 2 mg via INTRAVENOUS

## 2013-12-01 MED ORDER — METOPROLOL TARTRATE 25 MG PO TABS
25.0000 mg | ORAL_TABLET | Freq: Two times a day (BID) | ORAL | Status: DC
  Administered 2013-12-02: 25 mg via ORAL
  Filled 2013-12-01 (×3): qty 1

## 2013-12-01 MED ORDER — VECURONIUM BROMIDE 10 MG IV SOLR
INTRAVENOUS | Status: AC
  Filled 2013-12-01: qty 10

## 2013-12-01 MED ORDER — EPHEDRINE SULFATE 50 MG/ML IJ SOLN
INTRAMUSCULAR | Status: AC
  Filled 2013-12-01: qty 1

## 2013-12-01 MED ORDER — GENTAMICIN SULFATE 40 MG/ML IJ SOLN
INTRAMUSCULAR | Status: DC | PRN
  Administered 2013-12-01: 11:00:00

## 2013-12-01 MED ORDER — NEOSTIGMINE METHYLSULFATE 10 MG/10ML IV SOLN
INTRAVENOUS | Status: AC
  Filled 2013-12-01: qty 1

## 2013-12-01 MED ORDER — LACTATED RINGERS IV SOLN
INTRAVENOUS | Status: DC
  Administered 2013-12-01: 10:00:00 via INTRAVENOUS

## 2013-12-01 MED ORDER — VECURONIUM BROMIDE 10 MG IV SOLR
INTRAVENOUS | Status: DC | PRN
  Administered 2013-12-01: 1 mg via INTRAVENOUS
  Administered 2013-12-01: 2 mg via INTRAVENOUS

## 2013-12-01 MED ORDER — LIDOCAINE HCL (CARDIAC) 20 MG/ML IV SOLN
INTRAVENOUS | Status: AC
  Filled 2013-12-01: qty 5

## 2013-12-01 MED ORDER — MIDAZOLAM HCL 2 MG/2ML IJ SOLN
INTRAMUSCULAR | Status: AC
  Filled 2013-12-01: qty 2

## 2013-12-01 MED ORDER — METHOCARBAMOL 500 MG PO TABS
500.0000 mg | ORAL_TABLET | Freq: Four times a day (QID) | ORAL | Status: DC | PRN
  Administered 2013-12-01: 500 mg via ORAL
  Filled 2013-12-01: qty 1

## 2013-12-01 MED ORDER — SODIUM CHLORIDE 0.9 % IV SOLN
INTRAVENOUS | Status: DC
  Filled 2013-12-01: qty 1

## 2013-12-01 MED ORDER — FLUOXETINE HCL 10 MG PO CAPS
10.0000 mg | ORAL_CAPSULE | Freq: Every day | ORAL | Status: DC
  Administered 2013-12-02: 10 mg via ORAL
  Filled 2013-12-01: qty 1

## 2013-12-01 MED ORDER — ONDANSETRON HCL 4 MG/2ML IJ SOLN
INTRAMUSCULAR | Status: AC
Start: 2013-12-01 — End: 2013-12-01
  Filled 2013-12-01: qty 2

## 2013-12-01 MED ORDER — GLYCOPYRROLATE 0.2 MG/ML IJ SOLN
INTRAMUSCULAR | Status: DC | PRN
  Administered 2013-12-01: 0.6 mg via INTRAVENOUS

## 2013-12-01 MED ORDER — LACTATED RINGERS IV SOLN
INTRAVENOUS | Status: DC | PRN
  Administered 2013-12-01 (×2): via INTRAVENOUS

## 2013-12-01 MED ORDER — DOCUSATE SODIUM 100 MG PO CAPS
100.0000 mg | ORAL_CAPSULE | Freq: Every day | ORAL | Status: DC
  Administered 2013-12-01: 100 mg via ORAL
  Filled 2013-12-01: qty 1

## 2013-12-01 MED ORDER — ONDANSETRON HCL 4 MG/2ML IJ SOLN
INTRAMUSCULAR | Status: DC | PRN
  Administered 2013-12-01: 4 mg via INTRAVENOUS

## 2013-12-01 MED ORDER — NEOSTIGMINE METHYLSULFATE 10 MG/10ML IV SOLN
INTRAVENOUS | Status: DC | PRN
  Administered 2013-12-01: 5 mg via INTRAVENOUS

## 2013-12-01 MED ORDER — OXYCODONE HCL 5 MG PO TABS: 5.0000 mg | ORAL_TABLET | Freq: Once | ORAL | Status: DC | PRN

## 2013-12-01 MED ORDER — PROMETHAZINE HCL 25 MG/ML IJ SOLN
6.2500 mg | INTRAMUSCULAR | Status: DC | PRN
Start: 2013-12-01 — End: 2013-12-02
  Administered 2013-12-01: 6.25 mg via INTRAVENOUS
  Filled 2013-12-01: qty 1

## 2013-12-01 MED ORDER — GLYCOPYRROLATE 0.2 MG/ML IJ SOLN
INTRAMUSCULAR | Status: AC
  Filled 2013-12-01: qty 3

## 2013-12-01 MED ORDER — ONDANSETRON HCL 4 MG/2ML IJ SOLN: 4.0000 mg | Freq: Once | INTRAMUSCULAR | Status: DC | PRN

## 2013-12-01 MED ORDER — TRIAMTERENE-HCTZ 37.5-25 MG PO TABS
1.0000 | ORAL_TABLET | Freq: Every day | ORAL | Status: DC
  Administered 2013-12-01 – 2013-12-02 (×2): 1 via ORAL
  Filled 2013-12-01 (×2): qty 1

## 2013-12-01 MED ORDER — LIDOCAINE HCL (CARDIAC) 20 MG/ML IV SOLN
INTRAVENOUS | Status: DC | PRN
  Administered 2013-12-01: 100 mg via INTRATRACHEAL
  Administered 2013-12-01: 100 mg via INTRAVENOUS

## 2013-12-01 MED ORDER — PHENYLEPHRINE HCL 10 MG/ML IJ SOLN
INTRAMUSCULAR | Status: DC | PRN
  Administered 2013-12-01 (×2): 40 ug via INTRAVENOUS
  Administered 2013-12-01: 80 ug via INTRAVENOUS

## 2013-12-01 MED ORDER — FENTANYL CITRATE 0.05 MG/ML IJ SOLN
INTRAMUSCULAR | Status: AC
  Filled 2013-12-01: qty 5

## 2013-12-01 MED ORDER — ONDANSETRON HCL 4 MG/2ML IJ SOLN
INTRAMUSCULAR | Status: AC
  Filled 2013-12-01: qty 2

## 2013-12-01 MED ORDER — OXYCODONE HCL 5 MG/5ML PO SOLN: 5.0000 mg | Freq: Once | ORAL | Status: DC | PRN

## 2013-12-01 MED ORDER — STERILE WATER FOR INJECTION IJ SOLN
INTRAMUSCULAR | Status: AC
  Filled 2013-12-01: qty 10

## 2013-12-01 SURGICAL SUPPLY — 58 items
ADH SKN CLS APL DERMABOND .7 (GAUZE/BANDAGES/DRESSINGS) ×1
APPLIER CLIP 9.375 MED OPEN (MISCELLANEOUS)
APR CLP MED 9.3 20 MLT OPN (MISCELLANEOUS)
ATCH SMKEVC FLXB CAUT HNDSWH (FILTER) ×1 IMPLANT
BAG DECANTER FOR FLEXI CONT (MISCELLANEOUS) ×2 IMPLANT
BINDER BREAST XLRG (GAUZE/BANDAGES/DRESSINGS) ×1 IMPLANT
BINDER BREAST XXLRG (GAUZE/BANDAGES/DRESSINGS) ×1 IMPLANT
BIOPATCH RED 1 DISK 7.0 (GAUZE/BANDAGES/DRESSINGS) ×3 IMPLANT
CANISTER SUCTION 2500CC (MISCELLANEOUS) ×2 IMPLANT
CHLORAPREP W/TINT 26ML (MISCELLANEOUS) ×3 IMPLANT
CLIP APPLIE 9.375 MED OPEN (MISCELLANEOUS) IMPLANT
COVER SURGICAL LIGHT HANDLE (MISCELLANEOUS) ×2 IMPLANT
DERMABOND ADVANCED (GAUZE/BANDAGES/DRESSINGS) ×1
DERMABOND ADVANCED .7 DNX12 (GAUZE/BANDAGES/DRESSINGS) IMPLANT
DRAIN CHANNEL 19F RND (DRAIN) ×3 IMPLANT
DRAPE ORTHO SPLIT 77X108 STRL (DRAPES) ×4
DRAPE PROXIMA HALF (DRAPES) ×6 IMPLANT
DRAPE SURG 17X23 STRL (DRAPES) ×4 IMPLANT
DRAPE SURG ORHT 6 SPLT 77X108 (DRAPES) ×2 IMPLANT
DRAPE WARM FLUID 44X44 (DRAPE) ×2 IMPLANT
DRSG SORBAVIEW 3.5X5-5/16 MED (GAUZE/BANDAGES/DRESSINGS) ×3 IMPLANT
ELECT BLADE 4.0 EZ CLEAN MEGAD (MISCELLANEOUS) ×2
ELECT BLADE 6.5 EXT (BLADE) ×1 IMPLANT
ELECT CAUTERY BLADE 6.4 (BLADE) ×2 IMPLANT
ELECT REM PT RETURN 9FT ADLT (ELECTROSURGICAL) ×2
ELECTRODE BLDE 4.0 EZ CLN MEGD (MISCELLANEOUS) IMPLANT
ELECTRODE REM PT RTRN 9FT ADLT (ELECTROSURGICAL) ×1 IMPLANT
EVACUATOR SILICONE 100CC (DRAIN) ×4 IMPLANT
EVACUATOR SMOKE ACCUVAC VALLEY (FILTER) ×1
EXPANDER BREAST CONT 850CC (Breast) ×1 IMPLANT
GLOVE BIO SURGEON STRL SZ7.5 (GLOVE) ×2 IMPLANT
GLOVE BIOGEL PI IND STRL 7.0 (GLOVE) IMPLANT
GLOVE BIOGEL PI IND STRL 8 (GLOVE) ×1 IMPLANT
GLOVE BIOGEL PI INDICATOR 7.0 (GLOVE) ×3
GLOVE BIOGEL PI INDICATOR 8 (GLOVE) ×1
GLOVE SURG SS PI 6.5 STRL IVOR (GLOVE) ×1 IMPLANT
GLOVE SURG SS PI 7.0 STRL IVOR (GLOVE) ×3 IMPLANT
GOWN STRL REUS W/ TWL LRG LVL3 (GOWN DISPOSABLE) ×1 IMPLANT
GOWN STRL REUS W/ TWL XL LVL3 (GOWN DISPOSABLE) ×1 IMPLANT
GOWN STRL REUS W/TWL LRG LVL3 (GOWN DISPOSABLE) ×6
GOWN STRL REUS W/TWL XL LVL3 (GOWN DISPOSABLE) ×2
KIT BASIN OR (CUSTOM PROCEDURE TRAY) ×2 IMPLANT
KIT ROOM TURNOVER OR (KITS) ×2 IMPLANT
MARKER SKIN DUAL TIP RULER LAB (MISCELLANEOUS) ×2 IMPLANT
NS IRRIG 1000ML POUR BTL (IV SOLUTION) ×4 IMPLANT
PACK GENERAL/GYN (CUSTOM PROCEDURE TRAY) ×2 IMPLANT
PAD ABD 8X10 STRL (GAUZE/BANDAGES/DRESSINGS) ×1 IMPLANT
PAD ARMBOARD 7.5X6 YLW CONV (MISCELLANEOUS) ×2 IMPLANT
PREFILTER EVAC NS 1 1/3-3/8IN (MISCELLANEOUS) ×2 IMPLANT
STAPLER VISISTAT 35W (STAPLE) ×1 IMPLANT
SUT MNCRL AB 3-0 PS2 18 (SUTURE) ×6 IMPLANT
SUT PDS AB 3-0 SH 27 (SUTURE) IMPLANT
SUT PROLENE 3 0 PS 2 (SUTURE) ×2 IMPLANT
SUT VIC AB 3-0 SH 18 (SUTURE) ×2 IMPLANT
SYR BULB IRRIGATION 50ML (SYRINGE) ×2 IMPLANT
TOWEL OR 17X24 6PK STRL BLUE (TOWEL DISPOSABLE) ×2 IMPLANT
TOWEL OR 17X26 10 PK STRL BLUE (TOWEL DISPOSABLE) ×2 IMPLANT
TUBE CONNECTING 12X1/4 (SUCTIONS) ×2 IMPLANT

## 2013-12-01 NOTE — Brief Op Note (Signed)
12/01/2013  12:39 PM  PATIENT:  [name]  63 y.o. female  PRE-OPERATIVE DIAGNOSIS:  BREAST CANCER  POST-OPERATIVE DIAGNOSIS:  BREAST CANCER  PROCEDURE:  Procedure(s): TISSUE EXPANDER FOR DELAYED LEFT BREAST RECONSTRUCTION (Left)  BREAST RECONSTRUCTION (Left)  SURGEON:  Surgeon(s) and Role:    * Crissie Reese, MD - Primary  PHYSICIAN ASSISTANT:   ASSISTANTS: none   ANESTHESIA:   general  EBL:  Total I/O In: 1500 [I.V.:1500] Out: -   BLOOD ADMINISTERED:none  DRAINS: (1) Jackson-Pratt drain(s) with closed bulb suction in the left chest   LOCAL MEDICATIONS USED:  NONE  SPECIMEN:  No Specimen  DISPOSITION OF SPECIMEN:  N/A  COUNTS:  YES  TOURNIQUET:  * No tourniquets in log *  DICTATION: .Other Dictation: Dictation Number [id number]  PLAN OF CARE: Admit for overnight observation  PATIENT DISPOSITION:  PACU - hemodynamically stable.   Delay start of Pharmacological VTE agent (>24hrs) due to surgical blood loss or risk of bleeding: no

## 2013-12-01 NOTE — Anesthesia Procedure Notes (Signed)
Procedure Name: Intubation Date/Time: 12/01/2013 10:57 AM Performed by: Jenne Campus Pre-anesthesia Checklist: Patient identified, Emergency Drugs available, Suction available, Patient being monitored and Timeout performed Patient Re-evaluated:Patient Re-evaluated prior to inductionOxygen Delivery Method: Circle system utilized Preoxygenation: Pre-oxygenation with 100% oxygen Intubation Type: IV induction Ventilation: Mask ventilation without difficulty and Oral airway inserted - appropriate to patient size Laryngoscope Size: Miller and 2 Grade View: Grade I Tube type: Oral Tube size: 7.0 mm Number of attempts: 1 Airway Equipment and Method: Stylet and LTA kit utilized Placement Confirmation: ETT inserted through vocal cords under direct vision,  positive ETCO2,  CO2 detector and breath sounds checked- equal and bilateral Secured at: 21 cm Tube secured with: Tape Dental Injury: Teeth and Oropharynx as per pre-operative assessment

## 2013-12-01 NOTE — Transfer of Care (Signed)
Immediate Anesthesia Transfer of Care Note  Patient: [name]  Procedure(s) Performed: Procedure(s): TISSUE EXPANDER FOR DELAYED LEFT BREAST RECONSTRUCTION (Left)  BREAST RECONSTRUCTION (Left)  Patient Location: PACU  Anesthesia Type:General  Level of Consciousness: awake, alert , oriented and patient cooperative  Airway & Oxygen Therapy: Patient Spontanous Breathing and Patient connected to nasal cannula oxygen  Post-op Assessment: Report given to PACU RN and Post -op Vital signs reviewed and stable  Post vital signs: Reviewed  Complications: No apparent anesthesia complications

## 2013-12-01 NOTE — Progress Notes (Signed)
Report given to diane walker rn as caregiver 

## 2013-12-01 NOTE — H&P (Signed)
I have re-evaluated and re-examined the patient and there are no changes.  See H&P in office notes.   Planned procedure: Delayed breast reconstruction left side with tissue expander.

## 2013-12-01 NOTE — Op Note (Signed)
[name], [name]         ACCOUNT NO.:  [id number]  MEDICAL RECORD NO.:  [id number]  LOCATION:  6N23C                        FACILITY:  [facility]  PHYSICIAN:  Crissie Reese, M.D.     DATE OF BIRTH:  June 05, 1950  DATE OF PROCEDURE:  12/01/2013 DATE OF DISCHARGE:                              OPERATIVE REPORT   PREOPERATIVE DIAGNOSIS:  Personal history of breast cancer, left breast.  POSTOPERATIVE DIAGNOSIS:  Personal history of breast cancer, left breast.  PROCEDURE:  Delayed breast reconstruction with a tissue expander, left side.  SURGEON:  Crissie Reese, M.D.  ANESTHESIA:  General.  ESTIMATED BLOOD LOSS:  10 mL.  DRAINS:  One 19-French.  CLINICAL NOTE:  This 63 year old woman has had bilateral mastectomy for breast cancer and had bilateral placement of tissue expanders.  The left side developed a chronic seroma as she began chemotherapy necessitating its removal.  Right side has been well.  She presents today 6 months status post removal of her tissue expander and presents for a delayed breast reconstruction with placement of tissue expander.  The procedures were discussed with her in great detail.  She understood the risks plus complications include, but not limited to, bleeding, infection, healing problems, scarring, loss of sensation, fluid accumulations, anesthesia related complications, healing problems, pneumothorax, DVT, PE, failure of device, capsular contracture, displacement of device, wrinkles and ripples, possibility of loss of tissue expander, chronic pain, contour deformities, contour deformities at the periphery of the reconstruction, and overall disappointment.  She understood all this and wished to proceed.  DESCRIPTION OF PROCEDURE:  The patient was marked in the holding area in a full standing position for positioning of the expander.  She was then taken to operative room and placed supine.  After successful induction of general anesthesia, she was  prepped with ChloraPrep and after waiting the full 3 minutes for drying, she was draped with sterile drapes including impervious utility drapes.  The old mastectomy scar was utilized at its mid aspect and the dissection carried into subcutaneous tissue to the underlying muscle.  The muscle splitting incision was then used to access the submuscular space.  This dissection was performed very carefully to avoid damage to underlying chest cavity.  The pectoralis muscle having been mobilized, the space was developed to the dimensions of the implant.  Thorough irrigation with saline and then antibiotic solution was placed and allowed to dwell in the space. Meticulous hemostasis maintained using electrocautery.  The expander was repaired by soaking in antibiotic solution and after thoroughly cleaning gloves, 400 mL sterile saline placed using a closed filling system and the expander was returned to the antibiotic solution.  The space inspected and noted to be in excellent condition.  Betadine was placed around the skin edges and the 19-French drain was positioned, brought through separate stab wound inferiorly and secured with a 3-0 Prolene suture.  Again after thoroughly cleaning gloves, the expander was now positioned with great care taken to make sure it was oriented properly without any folds or wrinkles and then under direct vision, the port was accessed with the filled needle attached to the closed filling system. Antibiotic solution again placed in the space on top of the expander and 3-0 Vicryl  interrupted sutures used to close the muscle.  Additional saline was then placed using the closed filling system to a total of 650 mL.  This was sterile saline.  This seemed to give a nice fill volume without any undue tension.  The wound again irrigated with antibiotic solution and then the remainder of the closure with 3-0 Monocryl interrupted inverted deep dermal sutures and running 3-0  Monocryl subcuticular suture followed by Dermabond.  A Biopatch with an occlusive adherent dressing was then placed around the drain and ABDs and the breast binder placed, and she was transferred to the recovery in stable having tolerated the procedure well.  DISPOSITION:  She will be observed overnight as an outpatient.     Crissie Reese, M.D.     DB/MEDQ  D:  12/01/2013  T:  12/01/2013  Job:  [id number]

## 2013-12-01 NOTE — Anesthesia Preprocedure Evaluation (Addendum)
Anesthesia Evaluation  Patient identified by MRN, date of birth, ID band Patient awake    Reviewed: Allergy & Precautions, H&P , NPO status , Patient's Chart, lab work & pertinent test results  History of Anesthesia Complications (+) PONV and history of anesthetic complications  Airway Mallampati: I TM Distance: >3 FB Neck ROM: Full    Dental  (+) Edentulous Upper, Partial Lower, Dental Advisory Given   Pulmonary asthma , former smoker,  breath sounds clear to auscultation        Cardiovascular hypertension, Pt. on medications Rhythm:Regular Rate:Normal     Neuro/Psych Anxiety Depression    GI/Hepatic Neg liver ROS, GERD-  Medicated and Controlled,  Endo/Other  Morbid obesity  Renal/GU negative Renal ROS     Musculoskeletal   Abdominal   Peds  Hematology   Anesthesia Other Findings   Reproductive/Obstetrics negative OB ROS                          Anesthesia Physical Anesthesia Plan  ASA: II  Anesthesia Plan: General   Post-op Pain Management:    Induction: Intravenous  Airway Management Planned: LMA  Additional Equipment:   Intra-op Plan:   Post-operative Plan: Extubation in OR  Informed Consent: I have reviewed the patients History and Physical, chart, labs and discussed the procedure including the risks, benefits and alternatives for the proposed anesthesia with the patient or authorized representative who has indicated his/her understanding and acceptance.     Plan Discussed with: CRNA and Surgeon  Anesthesia Plan Comments:         Anesthesia Quick Evaluation

## 2013-12-02 ENCOUNTER — Encounter: Payer: Self-pay | Admitting: Gastroenterology

## 2013-12-02 DIAGNOSIS — Z421 Encounter for breast reconstruction following mastectomy: Secondary | ICD-10-CM | POA: Diagnosis not present

## 2013-12-02 MED ORDER — METHOCARBAMOL 500 MG PO TABS: 500.0000 mg | ORAL_TABLET | Freq: Four times a day (QID) | ORAL | Status: DC | PRN

## 2013-12-02 MED ORDER — HYDROMORPHONE HCL 2 MG PO TABS: 2.0000 mg | ORAL_TABLET | ORAL | Status: DC | PRN

## 2013-12-02 MED ORDER — CEPHALEXIN 500 MG PO CAPS
500.0000 mg | ORAL_CAPSULE | Freq: Four times a day (QID) | ORAL | Status: DC
  Administered 2013-12-02: 500 mg via ORAL
  Filled 2013-12-02: qty 1

## 2013-12-02 MED ORDER — ENOXAPARIN SODIUM 40 MG/0.4ML ~~LOC~~ SOLN: 40.0000 mg | SUBCUTANEOUS | Status: DC

## 2013-12-02 MED ORDER — CEPHALEXIN 500 MG PO CAPS: 500.0000 mg | ORAL_CAPSULE | Freq: Four times a day (QID) | ORAL | Status: DC

## 2013-12-02 NOTE — Anesthesia Postprocedure Evaluation (Signed)
Anesthesia Post Note  Patient: [name]  Procedure(s) Performed: Procedure(s) (LRB): TISSUE EXPANDER FOR DELAYED LEFT BREAST RECONSTRUCTION (Left)  BREAST RECONSTRUCTION (Left)  Anesthesia type: general  Patient location: PACU  Post pain: Pain level controlled  Post assessment: Patient's Cardiovascular Status Stable  Last Vitals:  Filed Vitals:   12/02/13 0448  BP: 150/75  Pulse: 92  Temp: 36.7 C  Resp: 18    Post vital signs: Reviewed and stable  Level of consciousness: sedated  Complications: No apparent anesthesia complications

## 2013-12-02 NOTE — Progress Notes (Signed)
Discharge instructions explained and given to pt.  Pt verbalized understanding of all orders/instructions.  JP drain output sheet given and explained to pt.  IV removed.  Pt aware that if she needs her RX for dilaudid to stop by Dr. Harlow Mares' office and ask for it d/t printer malfunction on 6N.  Pt in no s/s of distress. VSS and eager to go home.  Waiting on daughter to pick her up. Syliva Overman

## 2013-12-02 NOTE — Discharge Summary (Signed)
Physician Discharge Summary  Patient ID: [name] MRN: [id number] DOB/AGE: 09/05/1950 63 y.o.  Admit date: 12/01/2013 Discharge date: 12/02/2013  Admission Diagnoses:Left breast personal history breast cancer  Discharge Diagnoses: Same Active Problems:   Personal history of breast cancer   Discharged Condition: good  Hospital Course: On the day of admission the patient was taken to surgery and had left breast reconstruction with tissue expander. The patient tolerated the procedures well. Postoperatively, the patient was ambulatory and tolerating diet on the first postoperative day. Prophylaxis with IV antibiotics and DVT prophylaxis was continued.  Treatments: antibiotics: Ancef, anticoagulation: heparin and surgery: left breast reconstruction with tissue expander  Discharge Exam: Blood pressure 150/75, pulse 92, temperature 98 F (36.7 C), temperature source Oral, resp. rate 18, height 5\' 4"  (1.626 m), weight 234 lb (106.142 kg), SpO2 97.00%.  Operative sites: Mastectomy flaps viable. Tissue expander in good position. Drains functioning. Drainage thin. No evidence of bleeding or infection.  Disposition: 01-Home or Self Care     Medication List    STOP taking these medications       UNABLE TO FIND      TAKE these medications       acetaminophen 500 MG tablet  Commonly known as:  TYLENOL  Take 500-1,000 mg by mouth every 6 (six) hours as needed for mild pain.     cephALEXin 500 MG capsule  Commonly known as:  KEFLEX  Take 1 capsule (500 mg total) by mouth every 6 (six) hours.     enoxaparin 40 MG/0.4ML injection  Commonly known as:  LOVENOX  Inject 0.4 mLs (40 mg total) into the skin daily.     FLUoxetine 10 MG capsule  Commonly known as:  PROZAC  Take 10 mg by mouth daily.     HYDROmorphone 2 MG tablet  Commonly known as:  DILAUDID  Take 1-2 tablets (2-4 mg total) by mouth every 4 (four) hours as needed for moderate pain.     methocarbamol 500 MG  tablet  Commonly known as:  ROBAXIN  Take 1 tablet (500 mg total) by mouth every 6 (six) hours as needed for muscle spasms.     metoprolol 50 MG tablet  Commonly known as:  LOPRESSOR  Take 25 mg by mouth 2 (two) times daily.     triamterene-hydrochlorothiazide 37.5-25 MG per tablet  Commonly known as:  MAXZIDE-25  Take 1 tablet by mouth daily.         SignedHarlow Mares, Rondey Fallen M 12/02/2013, 8:46 AM

## 2013-12-02 NOTE — Discharge Instructions (Addendum)
No lifting for 6 weeks °No vigorous activity for 6 weeks (including outdoor walks) °No driving for 4 weeks °OK to walk up stairs slowly °Stay propped up °Use incentive spirometer at home every hour while awake °No shower while drains are in place °Empty drains at least three times a day and record the amounts separately °Change drain dressings every third day if instructed to do so by Dr. Shellsea Borunda ° Apply Bacitracin antibiotic ointment to the drain sites ° Place gauze dressing over drains ° Secure the gauze with tape °Take an over-the-counter Probiotic while on antibiotics °Take an over-the-counter stool softener (such as Colace) while on pain medication °See Dr. Raymir Frommelt next week °For questions call [phone] or [phone] °

## 2013-12-05 ENCOUNTER — Encounter (HOSPITAL_COMMUNITY): Payer: Self-pay | Admitting: Plastic Surgery

## 2013-12-26 ENCOUNTER — Encounter: Payer: Self-pay | Admitting: Internal Medicine

## 2013-12-26 ENCOUNTER — Ambulatory Visit (INDEPENDENT_AMBULATORY_CARE_PROVIDER_SITE_OTHER): Payer: BC Managed Care – PPO | Admitting: Internal Medicine

## 2013-12-26 VITALS — BP 128/72 | Temp 97.7°F | Ht 63.0 in | Wt 240.6 lb

## 2013-12-26 DIAGNOSIS — Z79899 Other long term (current) drug therapy: Secondary | ICD-10-CM

## 2013-12-26 DIAGNOSIS — R739 Hyperglycemia, unspecified: Secondary | ICD-10-CM

## 2013-12-26 DIAGNOSIS — Z853 Personal history of malignant neoplasm of breast: Secondary | ICD-10-CM

## 2013-12-26 DIAGNOSIS — I10 Essential (primary) hypertension: Secondary | ICD-10-CM

## 2013-12-26 NOTE — Progress Notes (Signed)
Pre visit review using our clinic review tool, if applicable. No additional management support is needed unless otherwise documented below in the visit note.  Chief Complaint  Patient presents with  . Follow-up    HPI: [name] is 63 y.o.  who comes in after a year for medication evaluation refill for low-dose Prozac which is really helped her mood over the years and through her recent medical problems. Also blood pressure medications metoprolol and diuretic as she cannot take an ACE inhibitor. 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  Gall bladder surgery and  And fluid collection with  Reconstruction surgery .  Did chemo  And eventually healed up.  Had se and allergic reaction.   Went back to work .  April 15  Echo every 3 months and ok so far .  Expander back in .   But draining again been problematic but feels she needs to work so is working a few hours a day. Right lateral upper abd  Lower chest pain around the area . Since her gallbladder surgery but oncologist has negative workup ROS: See pertinent positives and negatives per HPI. No current chest pain shortness of breath does like her sweets uncertain if aware of elevated blood sugars under treatment  Had swelling after going off the diuretic   In hospital  . Was told that the diuretic was an older one and she didn't need it. States her blood pressures generally controlled her hasn't been told itself. When she went back on the diuretic or swelling went down.  Past Medical History  Diagnosis Date  . 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 Other specified disorder of skin 12/25/2008  . 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 NECK PAIN 11/22/2007  . TOBACCO USE, QUIT 12/25/2008  . HYPERTENSION 11/22/2007    echo card 2004 nl lv function   . Ganglion cyst   . Cough due to ACE inhibitor 04/28/2011  .  PONV (postoperative nausea and vomiting)   . Depression   . Anxiety   . Arthritis   . Cancer     right breast cancer  . GERD (gastroesophageal reflux disease)     occ    Family History  Problem Relation Age of Onset  . Alzheimer's disease Mother   . Hypertension Mother   . Diabetes Other     1st degree relative  . Diabetes Father   . Bladder Cancer Brother     History   Social History  . Marital Status: Single    Spouse Name: N/A    Number of Children: N/A  . Years of Education: N/A   Occupational History  . Hairdresser    Social History Main Topics  . Smoking status: Former Smoker -- 1.50 packs/day for 15 years    Types: Cigarettes    Quit date: 03/04/1987  . Smokeless tobacco: Never Used  . Alcohol Use: No  . Drug Use: No  . Sexual Activity: None   Other Topics Concern  . 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    Former tobacco  No tad     Divorced.       Brother temp , son back in for temporarily.  People  Dog     Outpatient Encounter Prescriptions as of 12/26/2013  Medication Sig  . acetaminophen (TYLENOL) 500 MG tablet Take 500-1,000 mg by mouth every 6 (six) hours as needed for mild pain.  . cephALEXin (KEFLEX) 500 MG capsule Take 1 capsule (500 mg total) by mouth every 6 (six) hours.  Marland Kitchen FLUoxetine (PROZAC) 10 MG capsule Take 10 mg by mouth daily.  . methocarbamol (ROBAXIN) 500 MG tablet Take 1 tablet (500 mg total) by mouth every 6 (six) hours as needed for muscle spasms.  . metoprolol (LOPRESSOR) 50 MG tablet Take 25 mg by mouth 2 (two) times daily.  Marland Kitchen triamterene-hydrochlorothiazide (MAXZIDE-25) 37.5-25 MG per tablet Take 1 tablet by mouth daily.  . [DISCONTINUED] enoxaparin (LOVENOX) 40 MG/0.4ML injection Inject 0.4 mLs (40 mg total) into the skin daily.    EXAM:  BP 128/72  Temp(Src) 97.7 F (36.5 C) (Oral)  Ht 5\' 3"  (1.6 m)  Wt 240 lb 9.6  oz (109.135 kg)  BMI 42.63 kg/m2  Body mass index is 42.63 kg/(m^2).  GENERAL: vitals reviewed and listed above, alert, oriented, appears well hydrated and in no acute distress delightful talkative looks well HEENT: atraumatic, conjunctiva  clear, no obvious abnormalities on inspection of external nose and ears OP : no lesion edema or exudate  NECK: no obvious masses on inspection palpation  LUNGS: clear to auscultation bilaterally, no wheezes, rales or rhonchi, good air movement CV: HRRR, no clubbing cyanosis or  peripheral edema nl cap refill  Abdomen soft without organomegaly guarding or rebound well-healed gallbladder scar has seroma drain in place left MS: moves all extremities without noticeable focal  abnormality PSYCH: pleasant and cooperative, no obvious depression or anxiety Lab Results  Component Value Date   WBC 5.8 11/21/2013   HGB 14.1 11/21/2013   HCT 42.1 11/21/2013   PLT 141* 11/21/2013   GLUCOSE 204* 11/21/2013   CHOL 228* 12/20/2012   TRIG 275.0* 12/20/2012   HDL 43.40 12/20/2012   LDLDIRECT 153.4 12/20/2012   LDLCALC 110* 04/22/2010   ALT 29 12/20/2012   AST 29 12/20/2012   NA 139 11/21/2013   K 4.6 11/21/2013   CL 99 11/21/2013   CREATININE 0.81 11/21/2013   BUN 16 11/21/2013   CO2 28 11/21/2013   TSH 2.53 12/20/2012   BP Readings from Last 3 Encounters:  12/26/13 128/72  12/02/13 150/75  12/02/13 150/75    ASSESSMENT AND PLAN:  Discussed the following assessment and plan:  Essential hypertension - Document control consider switching medicine  Medication management - Low-dose Prozac served her well not a good time to just continue continue at this point refills as needed  Hyperglycemia - Concern about early diabetes I haven't been involved in her care for the past year but elevated readings elsewhere  Personal history of breast cancer [name] is here for a yearly follow-up for the medications and disease states.  Cost is always been an issue in  managing her hypertension in the past and she could not take an ACE inhibitor and no ARB's were available at generic at the time. At some point in the future consider switching to an ARB diuretic as tolerated. Review of her records show blood sugar has been in the 180s to 200s on and given reading but I do not know the context of these random readings. At this point prescription given for an hemoglobin A1c and BMP to be done with her next set of labs through her oncologist who is in [place]. If  they cannot do that we will follow-up here. Advised to great length the importance of cutting out sugar drinks beverages excess sweets desserts and simple carbs to avoid the progression to full-blown diabetes. Pending on her A1c and blood sugars she may be a candidate for metformin anyway however she prefers no extra medicines from which she's been through.  -Patient advised to return or notify health care team  if symptoms worsen ,persist or new concerns arise.  Patient Instructions  Blood sugar appears elevated .  From  specialty care labs . Concern about getting diabetes. Avoid sweets simple carbs   Breads  buscuits . Etc.   Need to have hga1c tests done .    Can write  An rxc for this  may need to add medication   Such as metformin  . Others.   Take blood pressure readings twice a day for 7   days and then periodically .To ensure below 140/90   .Send in readings  When you can .  Plan follow up depeding on labs  Or  3-4 months          Wanda K. Panosh M.D.

## 2013-12-26 NOTE — Patient Instructions (Addendum)
Blood sugar appears elevated .  From  specialty care labs . Concern about getting diabetes. Avoid sweets simple carbs   Breads  buscuits . Etc.   Need to have hga1c tests done .    Can write  An rxc for this  may need to add medication   Such as metformin  . Others.   Take blood pressure readings twice a day for 7   days and then periodically .To ensure below 140/90   .Send in readings  When you can .  Plan follow up depeding on labs  Or  3-4 months

## 2014-01-09 LAB — HEMOGLOBIN A1C: Hgb A1c MFr Bld: 7.4 % — AB (ref 4.0–6.0)

## 2014-01-10 ENCOUNTER — Encounter: Payer: Self-pay | Admitting: Family Medicine

## 2014-01-16 ENCOUNTER — Telehealth: Payer: Self-pay | Admitting: Internal Medicine

## 2014-01-16 NOTE — Telephone Encounter (Signed)
Patient would like to know her A1C results.

## 2014-01-16 NOTE — Telephone Encounter (Signed)
Lab Results  Component Value Date   HGBA1C 7.4* 01/09/2014   Lab Results  Component Value Date   LDLCALC 110* 04/22/2010   CREATININE 0.81 11/21/2013   Hga1c is in the diabetic range  7.4   I advise adding metformin 500 mg ER  1 po qd  Disp 90 refill x 1   Recheck hg a1c in 3 months and rov .

## 2014-01-17 NOTE — Telephone Encounter (Signed)
LMOM for the pt to return my call.

## 2014-01-18 NOTE — Telephone Encounter (Signed)
LMOM for the pt to return my call.

## 2014-01-19 ENCOUNTER — Telehealth: Payer: Self-pay | Admitting: Internal Medicine

## 2014-01-19 NOTE — Telephone Encounter (Signed)
Pt would like for you to call her on her work number [phone]. She said when she gets home we are closed

## 2014-01-19 NOTE — Telephone Encounter (Signed)
LMOM for the pt to return my call.  Will now close the note.  Have made multiple attempts to reach the pt.

## 2014-01-23 NOTE — Telephone Encounter (Signed)
Tried reaching the pt.  Phone rang many times.  No machine available.  Will try again at a later time.

## 2014-01-24 MED ORDER — METFORMIN HCL ER 500 MG PO TB24: 500.0000 mg | ORAL_TABLET | Freq: Every day | ORAL | Status: DC

## 2014-01-24 NOTE — Addendum Note (Signed)
Addended by: Miles Costain T on: 01/24/2014 10:23 AM   Modules accepted: Orders

## 2014-01-24 NOTE — Telephone Encounter (Signed)
Spoke to the pt.  She will start on metformin.  [facility] to [facility]  She has an existing follow up appt on 03/27/14 and has made a lab appt on 03/20/14.

## 2014-01-24 NOTE — Telephone Encounter (Signed)
Spoke to the pt.  See other telephone note from 01/16/14

## 2014-02-04 ENCOUNTER — Other Ambulatory Visit: Payer: Self-pay | Admitting: Internal Medicine

## 2014-02-06 ENCOUNTER — Telehealth: Payer: Self-pay | Admitting: Internal Medicine

## 2014-02-06 MED ORDER — TRIAMTERENE-HCTZ 37.5-25 MG PO TABS: 1.0000 | ORAL_TABLET | Freq: Every day | ORAL | Status: DC

## 2014-02-06 MED ORDER — FLUOXETINE HCL 10 MG PO CAPS: 10.0000 mg | ORAL_CAPSULE | Freq: Every day | ORAL | Status: DC

## 2014-02-06 MED ORDER — METOPROLOL TARTRATE 50 MG PO TABS: 25.0000 mg | ORAL_TABLET | Freq: Two times a day (BID) | ORAL | Status: DC

## 2014-02-06 NOTE — Telephone Encounter (Signed)
Sent to the pharmacy by e-scribe. 

## 2014-02-06 NOTE — Telephone Encounter (Signed)
Pt request refill FLUoxetine (PROZAC) 10 MG capsule triamterene-hydrochlorothiazide (MAXZIDE-25) 37.5-25 MG per tablet metoprolol (LOPRESSOR) 50 MG tablet [facility] [address]  Pt is out of the metoprolol and needs asap. pls disregard refill request from [place].   Pharm change

## 2014-02-10 ENCOUNTER — Telehealth: Payer: Self-pay | Admitting: Internal Medicine

## 2014-02-10 MED ORDER — TRIAMTERENE-HCTZ 37.5-25 MG PO TABS: 1.0000 | ORAL_TABLET | Freq: Every day | ORAL | Status: DC

## 2014-02-10 NOTE — Telephone Encounter (Signed)
Pt request refill of the following: triamterene-hydrochlorothiazide (MAXZIDE-25) 37.5-25 MG per tablet   Phamacy: [facility] [place]

## 2014-02-10 NOTE — Telephone Encounter (Signed)
Sent to the pharmacy by e-scribe. 

## 2014-03-14 ENCOUNTER — Telehealth: Payer: Self-pay | Admitting: Internal Medicine

## 2014-03-14 NOTE — Telephone Encounter (Signed)
Pt said she will be having surgery so she cancel her appt. She said if you still need her to have her A1C check you can fax an order to [facility] she will be there on 03/20/14

## 2014-03-14 NOTE — Telephone Encounter (Signed)
Pls advise.  

## 2014-03-14 NOTE — Telephone Encounter (Signed)
Please send in this order . Dx hyperglycemia  Good luck with the surgery

## 2014-03-15 NOTE — Telephone Encounter (Signed)
Left a detailed message for pt

## 2014-03-15 NOTE — Telephone Encounter (Signed)
[place] and spoke with Jonelle Sidle- fax number is [phone].  Lab order faxed.

## 2014-03-20 ENCOUNTER — Other Ambulatory Visit: Payer: BC Managed Care – PPO

## 2014-03-27 ENCOUNTER — Ambulatory Visit: Payer: BC Managed Care – PPO | Admitting: Internal Medicine

## 2014-03-28 ENCOUNTER — Other Ambulatory Visit: Payer: Self-pay | Admitting: Plastic Surgery

## 2014-05-01 ENCOUNTER — Encounter: Payer: Self-pay | Admitting: Internal Medicine

## 2014-05-01 ENCOUNTER — Ambulatory Visit (INDEPENDENT_AMBULATORY_CARE_PROVIDER_SITE_OTHER): Payer: BLUE CROSS/BLUE SHIELD | Admitting: Internal Medicine

## 2014-05-01 VITALS — BP 136/74 | Temp 97.8°F | Ht 63.0 in | Wt 234.0 lb

## 2014-05-01 DIAGNOSIS — I10 Essential (primary) hypertension: Secondary | ICD-10-CM

## 2014-05-01 DIAGNOSIS — E785 Hyperlipidemia, unspecified: Secondary | ICD-10-CM | POA: Insufficient documentation

## 2014-05-01 DIAGNOSIS — E119 Type 2 diabetes mellitus without complications: Secondary | ICD-10-CM

## 2014-05-01 DIAGNOSIS — Z853 Personal history of malignant neoplasm of breast: Secondary | ICD-10-CM

## 2014-05-01 MED ORDER — METFORMIN HCL ER 500 MG PO TB24: 1000.0000 mg | ORAL_TABLET | Freq: Every day | ORAL | Status: DC

## 2014-05-01 NOTE — Patient Instructions (Signed)
Lad you are doing betterl  Get regular eye exam . Goal fasting below 120 Goal 2 hours after eating  Below 180  Acceptable but best 140- 150 and below    Lab hga1c and lipid  In May 16 and then return office visit or thereabouts   Diabetes and Standards of Medical Care Diabetes is complicated. You may find that your diabetes team includes a dietitian, nurse, diabetes educator, eye doctor, and more. To help everyone know what is going on and to help you get the care you deserve, the following schedule of care was developed to help keep you on track. Below are the tests, exams, vaccines, medicines, education, and plans you will need. HbA1c test This test shows how well you have controlled your glucose over the past 2-3 months. It is used to see if your diabetes management plan needs to be adjusted.   It is performed at least 2 times a year if you are meeting treatment goals.  It is performed 4 times a year if therapy has changed or if you are not meeting treatment goals. Blood pressure test  This test is performed at every routine medical visit. The goal is less than 140/90 mm Hg for most people, but 130/80 mm Hg in some cases. Ask your health care provider about your goal. Dental exam  Follow up with the dentist regularly. Eye exam  If you are diagnosed with type 1 diabetes as a child, get an exam upon reaching the age of 61 years or older and have had diabetes for 3-5 years. Yearly eye exams are recommended after that initial eye exam.  If you are diagnosed with type 1 diabetes as an adult, get an exam within 5 years of diagnosis and then yearly.  If you are diagnosed with type 2 diabetes, get an exam as soon as possible after the diagnosis and then yearly. Foot care exam  Visual foot exams are performed at every routine medical visit. The exams check for cuts, injuries, or other problems with the feet.  A comprehensive foot exam should be done yearly. This includes visual inspection  as well as assessing foot pulses and testing for loss of sensation.  Check your feet nightly for cuts, injuries, or other problems with your feet. Tell your health care provider if anything is not healing. Kidney function test (urine microalbumin)  This test is performed once a year.  Type 1 diabetes: The first test is performed 5 years after diagnosis.  Type 2 diabetes: The first test is performed at the time of diagnosis.  A serum creatinine and estimated glomerular filtration rate (eGFR) test is done once a year to assess the level of chronic kidney disease (CKD), if present. Lipid profile (cholesterol, HDL, LDL, triglycerides)  Performed every 5 years for most people.  The goal for LDL is less than 100 mg/dL. If you are at high risk, the goal is less than 70 mg/dL.  The goal for HDL is 40 mg/dL-50 mg/dL for men and 50 mg/dL-60 mg/dL for women. An HDL cholesterol of 60 mg/dL or higher gives some protection against heart disease.  The goal for triglycerides is less than 150 mg/dL. Influenza vaccine, pneumococcal vaccine, and hepatitis B vaccine  The influenza vaccine is recommended yearly.  It is recommended that people with diabetes who are over 18 years old get the pneumonia vaccine. In some cases, two separate shots may be given. Ask your health care provider if your pneumonia vaccination is up to date.  The hepatitis B vaccine is also recommended for adults with diabetes. Diabetes self-management education  Education is recommended at diagnosis and ongoing as needed. Treatment plan  Your treatment plan is reviewed at every medical visit. Document Released: 12/15/2008 Document Revised: 07/04/2013 Document Reviewed: 07/20/2012 [facility] Patient Information 2015 [place], [place]. This information is not intended to replace advice given to you by your health care provider. Make sure you discuss any questions you have with your health care provider.

## 2014-05-01 NOTE — Progress Notes (Signed)
Pre visit review using our clinic review tool, if applicable. No additional management support is needed unless otherwise documented below in the visit note.  Chief Complaint  Patient presents with  . Follow-up    HPI: [name] 64 y.o. comesinf for fu of abnormal labs   cw new dm   Has finished with her breast cancer therapy and breast reconstruction. Doing well. Has been on on 500 metformin qd for a few months   Had lab with oncology  January and a1c was 7.1  Over 150 in pm in am usually less than 150  ocass 90 when watching and patying attention trying to dec sugar carb snacks at night . Working on it  [employer] wuite well. Had eye exan no neuropathy  Doesn't want to be on statin med at this time / ROS: See pertinent positives and negatives per HPI.  Past Medical History  Diagnosis Date  . 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 Other specified disorder of skin 12/25/2008  . 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 NECK PAIN 11/22/2007  . TOBACCO USE, QUIT 12/25/2008  . HYPERTENSION 11/22/2007    echo card 2004 nl lv function   . Ganglion cyst   . Cough due to ACE inhibitor 04/28/2011  . PONV (postoperative nausea and vomiting)   . Depression   . Anxiety   . Arthritis   . Cancer     right breast cancer  . GERD (gastroesophageal reflux disease)     occ    Family History  Problem Relation Age of Onset  . Alzheimer's disease Mother   . Hypertension Mother   . Diabetes Other     1st degree relative  . Diabetes Father   . Bladder Cancer Brother     History   Social History  . Marital Status: Single    Spouse Name: N/A  . Number of Children: N/A  . Years of Education: N/A   Occupational History  . Hairdresser    Social History Main Topics  . Smoking status: Former Smoker -- 1.50 packs/day for 15 years    Types: Cigarettes    Quit date: 03/04/1987  . Smokeless tobacco: Never Used  . Alcohol Use: No  . Drug Use: No  .  Sexual Activity: Not on file   Other Topics Concern  . 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    Former tobacco  No tad     Divorced.       Brother temp , son back in for temporarily.  People      Dog     Outpatient Encounter Prescriptions as of 05/01/2014  Medication Sig  . acetaminophen (TYLENOL) 500 MG tablet Take 500-1,000 mg by mouth every 6 (six) hours as needed for mild pain.  Marland Kitchen FLUoxetine (PROZAC) 10 MG capsule Take 1 capsule (10 mg total) by mouth daily.  . metFORMIN (GLUCOPHAGE XR) 500 MG 24 hr tablet Take 2 tablets (1,000 mg total) by mouth daily with breakfast.  . metoprolol (LOPRESSOR) 50 MG tablet Take 0.5 tablets (25 mg total) by mouth 2 (two) times daily.  Marland Kitchen triamterene-hydrochlorothiazide (MAXZIDE-25) 37.5-25 MG per tablet Take 1 tablet by mouth daily.  . [DISCONTINUED] metFORMIN (GLUCOPHAGE XR) 500 MG 24 hr tablet Take 1 tablet (500 mg total) by mouth daily with breakfast.  . [DISCONTINUED]  cephALEXin (KEFLEX) 500 MG capsule Take 1 capsule (500 mg total) by mouth every 6 (six) hours.  . [DISCONTINUED] methocarbamol (ROBAXIN) 500 MG tablet Take 1 tablet (500 mg total) by mouth every 6 (six) hours as needed for muscle spasms.    EXAM:  BP 136/74 mmHg  Temp(Src) 97.8 F (36.6 C) (Oral)  Ht $R'5\' 3"'cz$  (1.6 m)  Wt 234 lb (106.142 kg)  BMI 41.46 kg/m2  Body mass index is 41.46 kg/(m^2).  GENERAL: vitals reviewed and listed above, alert, oriented, appears well hydrated and in no acute distress looks healthy non toxic well  HEENT: atraumatic, conjunctiva  clear, no obvious abnormalities on inspection of external nose and ears  NECK: no obvious masses on inspection palpation  LUNGS: clear to auscultation bilaterally, no wheezes, rales or rhonchi, good air movement CV: HRRR, no clubbing cyanosis or  peripheral edema nl cap refill  MS: moves all extremities without noticeable focal   abnormality PSYCH: pleasant and cooperative, no obvious depression or anxiety Lab Results  Component Value Date   WBC 5.8 11/21/2013   HGB 14.1 11/21/2013   HCT 42.1 11/21/2013   PLT 141* 11/21/2013   GLUCOSE 204* 11/21/2013   CHOL 228* 12/20/2012   TRIG 275.0* 12/20/2012   HDL 43.40 12/20/2012   LDLDIRECT 153.4 12/20/2012   LDLCALC 110* 04/22/2010   ALT 29 12/20/2012   AST 29 12/20/2012   NA 139 11/21/2013   K 4.6 11/21/2013   CL 99 11/21/2013   CREATININE 0.81 11/21/2013   BUN 16 11/21/2013   CO2 28 11/21/2013   TSH 2.53 12/20/2012   HGBA1C 7.4* 01/09/2014    ASSESSMENT AND PLAN:  Discussed the following assessment and plan:  New onset type 2 diabetes mellitus uncomplicated  - P1W now 7.1  disc goals and inc metformin to 100 per day as well as lsi   counseled   Hyperlipidemia - declines statin med at this time  Essential hypertension - controlled   Personal history of breast cancer - now finished therapy and  breast reconstruction   -Patient advised to return or notify health care team  if symptoms worsen ,persist or new concerns arise.  Patient Instructions  Lad you are doing betterl  Get regular eye exam . Goal fasting below 120 Goal 2 hours after eating  Below 180  Acceptable but best 140- 150 and below    Lab hga1c and lipid  In May 16 and then return office visit or thereabouts   Diabetes and Standards of Medical Care Diabetes is complicated. You may find that your diabetes team includes a dietitian, nurse, diabetes educator, eye doctor, and more. To help everyone know what is going on and to help you get the care you deserve, the following schedule of care was developed to help keep you on track. Below are the tests, exams, vaccines, medicines, education, and plans you will need. HbA1c test This test shows how well you have controlled your glucose over the past 2-3 months. It is used to see if your diabetes management plan needs to be adjusted.   It is  performed at least 2 times a year if you are meeting treatment goals.  It is performed 4 times a year if therapy has changed or if you are not meeting treatment goals. Blood pressure test  This test is performed at every routine medical visit. The goal is less than 140/90 mm Hg for most people, but 130/80 mm Hg in some cases. Ask your health care provider  about your goal. Dental exam  Follow up with the dentist regularly. Eye exam  If you are diagnosed with type 1 diabetes as a child, get an exam upon reaching the age of 87 years or older and have had diabetes for 3-5 years. Yearly eye exams are recommended after that initial eye exam.  If you are diagnosed with type 1 diabetes as an adult, get an exam within 5 years of diagnosis and then yearly.  If you are diagnosed with type 2 diabetes, get an exam as soon as possible after the diagnosis and then yearly. Foot care exam  Visual foot exams are performed at every routine medical visit. The exams check for cuts, injuries, or other problems with the feet.  A comprehensive foot exam should be done yearly. This includes visual inspection as well as assessing foot pulses and testing for loss of sensation.  Check your feet nightly for cuts, injuries, or other problems with your feet. Tell your health care provider if anything is not healing. Kidney function test (urine microalbumin)  This test is performed once a year.  Type 1 diabetes: The first test is performed 5 years after diagnosis.  Type 2 diabetes: The first test is performed at the time of diagnosis.  A serum creatinine and estimated glomerular filtration rate (eGFR) test is done once a year to assess the level of chronic kidney disease (CKD), if present. Lipid profile (cholesterol, HDL, LDL, triglycerides)  Performed every 5 years for most people.  The goal for LDL is less than 100 mg/dL. If you are at high risk, the goal is less than 70 mg/dL.  The goal for HDL is 40  mg/dL-50 mg/dL for men and 50 mg/dL-60 mg/dL for women. An HDL cholesterol of 60 mg/dL or higher gives some protection against heart disease.  The goal for triglycerides is less than 150 mg/dL. Influenza vaccine, pneumococcal vaccine, and hepatitis B vaccine  The influenza vaccine is recommended yearly.  It is recommended that people with diabetes who are over 33 years old get the pneumonia vaccine. In some cases, two separate shots may be given. Ask your health care provider if your pneumonia vaccination is up to date.  The hepatitis B vaccine is also recommended for adults with diabetes. Diabetes self-management education  Education is recommended at diagnosis and ongoing as needed. Treatment plan  Your treatment plan is reviewed at every medical visit. Document Released: 12/15/2008 Document Revised: 07/04/2013 Document Reviewed: 07/20/2012 [facility] Patient Information 2015 [place], [place]. This information is not intended to replace advice given to you by your health care provider. Make sure you discuss any questions you have with your health care provider.      Standley Brooking. Danetta Prom M.D.

## 2014-05-02 ENCOUNTER — Telehealth: Payer: Self-pay | Admitting: Internal Medicine

## 2014-05-02 NOTE — Telephone Encounter (Signed)
emmi emailed °

## 2014-05-10 ENCOUNTER — Other Ambulatory Visit: Payer: Self-pay | Admitting: Internal Medicine

## 2014-05-10 NOTE — Telephone Encounter (Signed)
Sent to the pharmacy by e-scribe. 

## 2014-06-15 ENCOUNTER — Other Ambulatory Visit: Payer: Self-pay | Admitting: Internal Medicine

## 2014-07-10 ENCOUNTER — Other Ambulatory Visit (INDEPENDENT_AMBULATORY_CARE_PROVIDER_SITE_OTHER): Payer: BLUE CROSS/BLUE SHIELD

## 2014-07-10 DIAGNOSIS — E119 Type 2 diabetes mellitus without complications: Secondary | ICD-10-CM | POA: Diagnosis not present

## 2014-07-10 DIAGNOSIS — E785 Hyperlipidemia, unspecified: Secondary | ICD-10-CM

## 2014-07-10 LAB — LIPID PANEL
Cholesterol: 205 mg/dL — ABNORMAL HIGH (ref 0–200)
HDL: 50.2 mg/dL (ref >39.00)
LDL Cholesterol: 125 mg/dL — ABNORMAL HIGH (ref 0–99)
NONHDL: 154.8
Total CHOL/HDL Ratio: 4
Triglycerides: 148 mg/dL (ref 0.0–149.0)
VLDL: 29.6 mg/dL (ref 0.0–40.0)

## 2014-07-10 LAB — HEMOGLOBIN A1C: HEMOGLOBIN A1C: 6.8 % — AB (ref 4.6–6.5)

## 2014-07-17 ENCOUNTER — Encounter: Payer: Self-pay | Admitting: Internal Medicine

## 2014-07-17 ENCOUNTER — Ambulatory Visit (INDEPENDENT_AMBULATORY_CARE_PROVIDER_SITE_OTHER): Payer: BLUE CROSS/BLUE SHIELD | Admitting: Internal Medicine

## 2014-07-17 VITALS — BP 126/80 | HR 68 | Temp 98.2°F | Resp 20 | Ht 63.0 in | Wt 236.0 lb

## 2014-07-17 DIAGNOSIS — E785 Hyperlipidemia, unspecified: Secondary | ICD-10-CM

## 2014-07-17 DIAGNOSIS — I10 Essential (primary) hypertension: Secondary | ICD-10-CM

## 2014-07-17 DIAGNOSIS — E119 Type 2 diabetes mellitus without complications: Secondary | ICD-10-CM

## 2014-07-17 DIAGNOSIS — Z853 Personal history of malignant neoplasm of breast: Secondary | ICD-10-CM | POA: Diagnosis not present

## 2014-07-17 NOTE — Progress Notes (Signed)
Pre visit review using our clinic review tool, if applicable. No additional management support is needed unless otherwise documented below in the visit note. 

## 2014-07-17 NOTE — Progress Notes (Signed)
Chief Complaint  Patient presents with  . Follow-up    diabetes    HPI: [name] 64 y.o.  comes in for chronic disease/ medication management  Dm now on 1000 mg metformin per day doing ok . Working on diet . Doing well from cancer rx  q 3 month visit .  Hard to use compression stockings they are hard to put on .  No excess swelling.  Bp seems ok  No se of med ROS: See pertinent positives and negatives per HPI. No falling neuro sx   Past Medical History  Diagnosis Date  . 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 Other specified disorder of skin 12/25/2008  . 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 NECK PAIN 11/22/2007  . TOBACCO USE, QUIT 12/25/2008  . HYPERTENSION 11/22/2007    echo card 2004 nl lv function   . Ganglion cyst   . Cough due to ACE inhibitor 04/28/2011  . PONV (postoperative nausea and vomiting)   . Depression   . Anxiety   . Arthritis   . Cancer     right breast cancer  . GERD (gastroesophageal reflux disease)     occ    Family History  Problem Relation Age of Onset  . Alzheimer's disease Mother   . Hypertension Mother   . Diabetes Other     1st degree relative  . Diabetes Father   . Bladder Cancer Brother     History   Social History  . Marital Status: Single    Spouse Name: N/A  . Number of Children: N/A  . Years of Education: N/A   Occupational History  . Hairdresser    Social History Main Topics  . Smoking status: Former Smoker -- 1.50 packs/day for 15 years    Types: Cigarettes    Quit date: 03/04/1987  . Smokeless tobacco: Never Used  . Alcohol Use: No  . Drug Use: No  . Sexual Activity: Not on file   Other Topics Concern  . 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    Former tobacco  No tad     Divorced.       Brother temp , son back in for temporarily.  People      Dog     Outpatient Prescriptions  Prior to Visit  Medication Sig Dispense Refill  . FLUoxetine (PROZAC) 10 MG capsule TAKE ONE CAPSULE BY MOUTH EVERY DAY 90 capsule 0  . metFORMIN (GLUCOPHAGE XR) 500 MG 24 hr tablet Take 2 tablets (1,000 mg total) by mouth daily with breakfast. 180 tablet 1  . metoprolol (LOPRESSOR) 50 MG tablet TAKE 1/2 TABLET TWICE DAILY 90 tablet 1  . triamterene-hydrochlorothiazide (MAXZIDE-25) 37.5-25 MG per tablet Take 1 tablet by mouth daily. 90 tablet 0  . triamterene-hydrochlorothiazide (MAXZIDE-25) 37.5-25 MG per tablet TAKE 1 TABLET BY MOUTH DAILY. 90 tablet 1  . acetaminophen (TYLENOL) 500 MG tablet Take 500-1,000 mg by mouth every 6 (six) hours as needed for mild pain.     No facility-administered medications prior to visit.     EXAM:  BP 126/80 mmHg  Pulse 68  Temp(Src) 98.2 F (36.8 C) (Oral)  Resp 20  Ht 5\' 3"  (1.6 m)  Wt 236 lb (107.049 kg)  BMI 41.82 kg/m2  SpO2 97%  Body mass index is 41.82 kg/(m^2).  GENERAL: vitals reviewed and listed above, alert, oriented, appears well hydrated and in no acute distress HEENT: atraumatic, conjunctiva  clear, no obvious abnormalities on inspection of external nose and ears  NECK: no obvious masses on inspection palpation  LUNGS: clear to auscultation bilaterally, no wheezes, rales or rhonchi, good air movement CV: HRRR, no clubbing cyanosis or  peripheral edema nl cap refill  ssome vv no ulcers feet clear MS: moves all extremities without noticeable focal  abnormality PSYCH: pleasant and cooperative, no obvious depression or anxiety Lab Results  Component Value Date   WBC 5.8 11/21/2013   HGB 14.1 11/21/2013   HCT 42.1 11/21/2013   PLT 141* 11/21/2013   GLUCOSE 204* 11/21/2013   CHOL 205* 07/10/2014   TRIG 148.0 07/10/2014   HDL 50.20 07/10/2014   LDLDIRECT 153.4 12/20/2012   LDLCALC 125* 07/10/2014   ALT 29 12/20/2012   AST 29 12/20/2012   NA 139 11/21/2013   K 4.6 11/21/2013   CL 99 11/21/2013   CREATININE 0.81 11/21/2013    BUN 16 11/21/2013   CO2 28 11/21/2013   TSH 2.53 12/20/2012   HGBA1C 6.8* 07/10/2014   BP Readings from Last 3 Encounters:  07/17/14 126/80  05/01/14 136/74  12/26/13 128/72   Wt Readings from Last 3 Encounters:  07/17/14 236 lb (107.049 kg)  05/01/14 234 lb (106.142 kg)  12/26/13 240 lb 9.6 oz (109.135 kg)    ASSESSMENT AND PLAN:  Discussed the following assessment and plan:  New onset type 2 diabetes mellitus uncomplicated  - improved V3Z  cont lsi recheck 6 months   Hyperlipidemia - disc declines statin today will follow lsi etc   Essential hypertension - better today on repeat . consdier arb if needed for control had se of acei  Personal history of breast cancer Weigh tloss diet change lsi  -Patient advised to return or notify health care team  if symptoms worsen ,persist or new concerns arise.  Patient Instructions  Your diabetes much better  Continues same medication Continue lifestyle intervention healthy eating and exercise .  Wellness visit  In 6 months with labs and hga1c pre visit      Standley Brooking. Panosh M.D.

## 2014-07-17 NOTE — Patient Instructions (Signed)
Your diabetes much better  Continues same medication Continue lifestyle intervention healthy eating and exercise .  Wellness visit  In 6 months with labs and hga1c pre visit

## 2014-07-23 ENCOUNTER — Other Ambulatory Visit: Payer: Self-pay | Admitting: Internal Medicine

## 2014-07-24 NOTE — Telephone Encounter (Signed)
Sent to the pharmacy by e-scribe. 

## 2014-08-28 ENCOUNTER — Other Ambulatory Visit: Payer: Self-pay

## 2014-09-16 ENCOUNTER — Other Ambulatory Visit: Payer: Self-pay | Admitting: Internal Medicine

## 2014-09-18 NOTE — Telephone Encounter (Signed)
Sent to the pharmacy by e-scribe.  Pt has upcoming appt on 01/22/15 for cpx.

## 2014-11-15 ENCOUNTER — Other Ambulatory Visit: Payer: Self-pay | Admitting: Internal Medicine

## 2014-11-15 NOTE — Telephone Encounter (Signed)
Sent to the pharmacy by e-scribe.  Pt has upcoming cpx on 01/22/15

## 2014-11-22 ENCOUNTER — Encounter (HOSPITAL_BASED_OUTPATIENT_CLINIC_OR_DEPARTMENT_OTHER): Payer: Self-pay | Admitting: *Deleted

## 2014-11-23 ENCOUNTER — Telehealth: Payer: Self-pay | Admitting: Internal Medicine

## 2014-11-23 NOTE — Telephone Encounter (Signed)
Pt would like appt for this Monday or the next. Pt is off work on [employer]. Pt is having trouble w/ her hands and thinks she may need referral and would like appt before  The end of year and insurance starts over.  Pt is a hairdresser for 20 yrs pls advise if ok to schedule. Pt has cpe in nov

## 2014-11-24 NOTE — Telephone Encounter (Signed)
Please help the pt to make appt.  See if she will speak to triage to collect info for Dr. Mamie Nick.  Thanks!

## 2014-11-27 ENCOUNTER — Ambulatory Visit (INDEPENDENT_AMBULATORY_CARE_PROVIDER_SITE_OTHER): Payer: BLUE CROSS/BLUE SHIELD | Admitting: Internal Medicine

## 2014-11-27 ENCOUNTER — Encounter: Payer: Self-pay | Admitting: Internal Medicine

## 2014-11-27 ENCOUNTER — Ambulatory Visit: Payer: BLUE CROSS/BLUE SHIELD | Admitting: Internal Medicine

## 2014-11-27 VITALS — BP 124/68 | HR 86 | Temp 98.2°F | Wt 236.0 lb

## 2014-11-27 DIAGNOSIS — M7918 Myalgia, other site: Secondary | ICD-10-CM

## 2014-11-27 DIAGNOSIS — Z23 Encounter for immunization: Secondary | ICD-10-CM | POA: Diagnosis not present

## 2014-11-27 DIAGNOSIS — M79641 Pain in right hand: Secondary | ICD-10-CM

## 2014-11-27 DIAGNOSIS — M6530 Trigger finger, unspecified finger: Secondary | ICD-10-CM

## 2014-11-27 DIAGNOSIS — M791 Myalgia: Secondary | ICD-10-CM

## 2014-11-27 DIAGNOSIS — Z853 Personal history of malignant neoplasm of breast: Secondary | ICD-10-CM

## 2014-11-27 MED ORDER — PREDNISONE 10 MG PO TABS: ORAL_TABLET | ORAL | Status: DC

## 2014-11-27 NOTE — Patient Instructions (Addendum)
Ok to try prednisone for sciatic .Marland Kitchen  And if not settling down then see ortho for evaluation. .  Plan send you  Consult to hand surgery  For the trigger finger  Right hand arthritis  Pain.   Trigger Finger Trigger finger (digital tendinitis and stenosing tenosynovitis) is a common disorder that causes an often painful catching of the fingers or thumb. It occurs as a clicking, snapping, or locking of a finger in the palm of the hand. This is caused by a problem with the tendons that flex or bend the fingers sliding smoothly through their sheaths. The condition may occur in any finger or a couple fingers at the same time.  The finger may lock with the finger curled or suddenly straighten out with a snap. This is more common in patients with rheumatoid arthritis and diabetes. Left untreated, the condition may get worse to the point where the finger becomes locked in flexion, like making a fist, or less commonly locked with the finger straightened out. CAUSES   Inflammation and scarring that lead to swelling around the tendon sheath.  Repeated or forceful movements.  Rheumatoid arthritis, an autoimmune disease that affects joints.  Gout.  Diabetes mellitus. SIGNS AND SYMPTOMS  Soreness and swelling of your finger.  A painful clicking or snapping as you bend and straighten your finger. DIAGNOSIS  Your health care provider will do a physical exam of your finger to diagnose trigger finger. TREATMENT   Splinting for 6-8 weeks may be helpful.  Nonsteroidal anti-inflammatory medicines (NSAIDs) can help to relieve the pain and inflammation.  Cortisone injections, along with splinting, may speed up recovery. Several injections may be required. Cortisone may give relief after one injection.  Surgery is another treatment that may be used if conservative treatments do not work. Surgery can be minor, without incisions (a cut does not have to be made), and can be done with a needle through the  skin.  Other surgical choices involve an open procedure in which the surgeon opens the hand through a small incision and cuts the pulley so the tendon can again slide smoothly. Your hand will still work fine. HOME CARE INSTRUCTIONS  Apply ice to the injured area, twice per day:  Put ice in a plastic bag.  Place a towel between your skin and the bag.  Leave the ice on for 20 minutes, 3-4 times a day.  Rest your hand often. MAKE SURE YOU:   Understand these instructions.  Will watch your condition.  Will get help right away if you are not doing well or get worse. Document Released: 12/08/2003 Document Revised: 10/20/2012 Document Reviewed: 07/20/2012 [facility] Patient Information 2015 [place], [place]. This information is not intended to replace advice given to you by your health care provider. Make sure you discuss any questions you have with your health care provider.

## 2014-11-27 NOTE — Progress Notes (Signed)
Chief Complaint  Patient presents with  . Hand Pain    Right hand/wrist    HPI:  [name]  comes in today for SDA for  new problem evaluation. She states that she has had joint pains and arthritis type pains for a while but recently her right hand has become more problematic and is hard to make a fist and many times extend her middle finger and has to pull it out after it makes noises. She is a hairdresser works full time right handed. Denies any numbness or specific weakness. No recent injury.  Old age aches and pains but last winter had significant pain that was a burning type pain down her right buttocks to her right lateral knee. She ended up going to urgent care prednisone was given and helped her a whole lot resolve for a while. It is now come back recently but nowhere near as bad. Considering seeing orthopedist. However would like to see if it calms down with prednisone again. Denies any weakness bowel bladder changes trauma.  ROS: See pertinent positives and negatives per HPI. No fever her diabetes is in control she is past chemotherapy for her breast cancer port is to be taken out soon.  Past Medical History  Diagnosis Date  . 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 Other specified disorder of skin 12/25/2008  . 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 NECK PAIN 11/22/2007  . TOBACCO USE, QUIT 12/25/2008  . HYPERTENSION 11/22/2007    echo card 2004 nl lv function   . Ganglion cyst   . Cough due to ACE inhibitor 04/28/2011  . PONV (postoperative nausea and vomiting)   . Depression   . Anxiety   . Arthritis   . Cancer     right breast cancer  . GERD (gastroesophageal reflux disease)     occ    Family History  Problem Relation Age of Onset  . Alzheimer's disease Mother   . Hypertension Mother   . Diabetes Other     1st degree relative  . Diabetes Father   . Bladder Cancer Brother     Social History   Social History  . Marital Status:  Single    Spouse Name: N/A  . Number of Children: N/A  . Years of Education: N/A   Occupational History  . Hairdresser    Social History Main Topics  . Smoking status: Former Smoker -- 1.50 packs/day for 15 years    Types: Cigarettes    Quit date: 03/04/1987  . Smokeless tobacco: Never Used  . Alcohol Use: No  . Drug Use: No  . Sexual Activity: Not Asked   Other Topics Concern  . 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    Former tobacco  No tad     Divorced.       Brother temp , son back in for temporarily.  People      Dog     Outpatient Prescriptions Prior to Visit  Medication Sig Dispense Refill  . FLUoxetine (PROZAC) 10 MG capsule TAKE ONE CAPSULE BY MOUTH EVERY DAY 90 capsule 1  . metFORMIN (GLUCOPHAGE XR) 500 MG 24 hr tablet Take 2 tablets (1,000 mg total) by mouth daily with breakfast. 180 tablet 1  . metoprolol (LOPRESSOR) 50 MG tablet TAKE 1/2 TABLET TWICE DAILY 90 tablet  0  . triamterene-hydrochlorothiazide (MAXZIDE-25) 37.5-25 MG per tablet Take 1 tablet by mouth daily. 90 tablet 0  . triamterene-hydrochlorothiazide (MAXZIDE-25) 37.5-25 MG per tablet TAKE 1 TABLET BY MOUTH DAILY. 90 tablet 0   No facility-administered medications prior to visit.     EXAM:  BP 124/68 mmHg  Pulse 86  Temp(Src) 98.2 F (36.8 C) (Oral)  Wt 236 lb (107.049 kg)  SpO2 98%  Body mass index is 41.82 kg/(m^2).  GENERAL: vitals reviewed and listed above, alert, oriented, appears well hydrated and in no acute distress HEENT: atraumatic, conjunctiva  clear, no obvious abnormalities on inspection of external nose and ears OMS: moves all extremities right hand with some mild arthritis changes and some click of the middle finger flexion extension. She can move it today. There is an old ganglion slight cyst on the right dorsal. Neurovascular appears intact.   Gait is within normal limits no  obvious focal deficits of her lower extremity.  PSYCH: pleasant and cooperative, no obvious depression or anxiety  ASSESSMENT AND PLAN:  Discussed the following assessment and plan:  Trigger finger, acquired - Plan: Ambulatory referral to Hand Surgery  Hand pain, right - arthritis noted   - Plan: Ambulatory referral to Hand Surgery  Right buttock pain - with radiation to leg lateral knee   Need for influenza vaccination - Plan: Flu Vaccine QUAD 36+ mos IM  Personal history of breast cancer Reasonable to give her prednisone course in regard to her back sciatic symptoms. No alarm features otherwise. Except that it does get worse when she lays down. She definitely has some arthritic changes in her right hand over her left dominant hand and trigger finger. Refer to Copy. She lives in [place]. Monitor her blood sugars get back with [place] if needed. If too high. -Patient advised to return or notify health care team  if symptoms worsen ,persist or new concerns arise.  Patient Instructions  Ok to try prednisone for sciatic .Marland Kitchen  And if not settling down then see ortho for evaluation. .  Plan send you  Consult to hand surgery  For the trigger finger  Right hand arthritis  Pain.   Trigger Finger Trigger finger (digital tendinitis and stenosing tenosynovitis) is a common disorder that causes an often painful catching of the fingers or thumb. It occurs as a clicking, snapping, or locking of a finger in the palm of the hand. This is caused by a problem with the tendons that flex or bend the fingers sliding smoothly through their sheaths. The condition may occur in any finger or a couple fingers at the same time.  The finger may lock with the finger curled or suddenly straighten out with a snap. This is more common in patients with rheumatoid arthritis and diabetes. Left untreated, the condition may get worse to the point where the finger becomes locked in flexion, like making a fist, or less  commonly locked with the finger straightened out. CAUSES   Inflammation and scarring that lead to swelling around the tendon sheath.  Repeated or forceful movements.  Rheumatoid arthritis, an autoimmune disease that affects joints.  Gout.  Diabetes mellitus. SIGNS AND SYMPTOMS  Soreness and swelling of your finger.  A painful clicking or snapping as you bend and straighten your finger. DIAGNOSIS  Your health care provider will do a physical exam of your finger to diagnose trigger finger. TREATMENT   Splinting for 6-8 weeks may be helpful.  Nonsteroidal anti-inflammatory medicines (NSAIDs) can help to relieve  the pain and inflammation.  Cortisone injections, along with splinting, may speed up recovery. Several injections may be required. Cortisone may give relief after one injection.  Surgery is another treatment that may be used if conservative treatments do not work. Surgery can be minor, without incisions (a cut does not have to be made), and can be done with a needle through the skin.  Other surgical choices involve an open procedure in which the surgeon opens the hand through a small incision and cuts the pulley so the tendon can again slide smoothly. Your hand will still work fine. HOME CARE INSTRUCTIONS  Apply ice to the injured area, twice per day:  Put ice in a plastic bag.  Place a towel between your skin and the bag.  Leave the ice on for 20 minutes, 3-4 times a day.  Rest your hand often. MAKE SURE YOU:   Understand these instructions.  Will watch your condition.  Will get help right away if you are not doing well or get worse. Document Released: 12/08/2003 Document Revised: 10/20/2012 Document Reviewed: 07/20/2012 [facility] Patient Information 2015 [place], [place]. This information is not intended to replace advice given to you by your health care provider. Make sure you discuss any questions you have with your health care provider.      Standley Brooking.  Panosh M.D.

## 2014-11-27 NOTE — Progress Notes (Signed)
Pre visit review using our clinic review tool, if applicable. No additional management support is needed unless otherwise documented below in the visit note. 

## 2014-11-27 NOTE — Telephone Encounter (Signed)
appt scheduled

## 2014-11-29 ENCOUNTER — Other Ambulatory Visit: Payer: Self-pay | Admitting: General Surgery

## 2014-11-29 ENCOUNTER — Encounter (HOSPITAL_BASED_OUTPATIENT_CLINIC_OR_DEPARTMENT_OTHER)
Admission: RE | Admit: 2014-11-29 | Discharge: 2014-11-29 | Disposition: A | Discharge status: Home / Self Care | Payer: BLUE CROSS/BLUE SHIELD | Source: Ambulatory Visit | Attending: General Surgery | Admitting: General Surgery

## 2014-11-29 DIAGNOSIS — J449 Chronic obstructive pulmonary disease, unspecified: Secondary | ICD-10-CM | POA: Diagnosis not present

## 2014-11-29 DIAGNOSIS — I10 Essential (primary) hypertension: Secondary | ICD-10-CM | POA: Diagnosis not present

## 2014-11-29 DIAGNOSIS — Z9013 Acquired absence of bilateral breasts and nipples: Secondary | ICD-10-CM | POA: Diagnosis not present

## 2014-11-29 DIAGNOSIS — Z452 Encounter for adjustment and management of vascular access device: Secondary | ICD-10-CM | POA: Diagnosis present

## 2014-11-29 DIAGNOSIS — Z853 Personal history of malignant neoplasm of breast: Secondary | ICD-10-CM | POA: Diagnosis not present

## 2014-11-29 DIAGNOSIS — Z87891 Personal history of nicotine dependence: Secondary | ICD-10-CM | POA: Diagnosis not present

## 2014-11-29 DIAGNOSIS — E119 Type 2 diabetes mellitus without complications: Secondary | ICD-10-CM | POA: Diagnosis not present

## 2014-11-29 DIAGNOSIS — Z79899 Other long term (current) drug therapy: Secondary | ICD-10-CM | POA: Diagnosis not present

## 2014-11-29 DIAGNOSIS — K219 Gastro-esophageal reflux disease without esophagitis: Secondary | ICD-10-CM | POA: Diagnosis not present

## 2014-11-29 LAB — BASIC METABOLIC PANEL
Anion gap: 8 (ref 5–15)
BUN: 19 mg/dL (ref 6–20)
CHLORIDE: 104 mmol/L (ref 101–111)
CO2: 26 mmol/L (ref 22–32)
CREATININE: 0.81 mg/dL (ref 0.44–1.00)
Calcium: 9.6 mg/dL (ref 8.9–10.3)
GFR calc Af Amer: >60 mL/min (ref >60)
GFR calc non Af Amer: >60 mL/min (ref >60)
Glucose, Bld: 119 mg/dL — ABNORMAL HIGH (ref 65–99)
Potassium: 4.5 mmol/L (ref 3.5–5.1)
Sodium: 138 mmol/L (ref 135–145)

## 2014-11-29 NOTE — Progress Notes (Signed)
ekg reviewed by Dr. Marcie Bal ok for port removal here and Dr. Oletta Lamas said patient could be evaluated Friday on arrival

## 2014-12-01 ENCOUNTER — Ambulatory Visit (HOSPITAL_BASED_OUTPATIENT_CLINIC_OR_DEPARTMENT_OTHER): Payer: BLUE CROSS/BLUE SHIELD | Admitting: Anesthesiology

## 2014-12-01 ENCOUNTER — Ambulatory Visit (HOSPITAL_BASED_OUTPATIENT_CLINIC_OR_DEPARTMENT_OTHER)
Admission: RE | Admit: 2014-12-01 | Discharge: 2014-12-01 | Disposition: A | Discharge status: Home / Self Care | Payer: BLUE CROSS/BLUE SHIELD | Source: Ambulatory Visit | Attending: General Surgery | Admitting: General Surgery

## 2014-12-01 ENCOUNTER — Encounter (HOSPITAL_BASED_OUTPATIENT_CLINIC_OR_DEPARTMENT_OTHER)
Admission: RE | Disposition: A | Discharge status: Home / Self Care | Payer: Self-pay | Source: Ambulatory Visit | Attending: General Surgery

## 2014-12-01 ENCOUNTER — Encounter (HOSPITAL_BASED_OUTPATIENT_CLINIC_OR_DEPARTMENT_OTHER): Payer: Self-pay | Admitting: *Deleted

## 2014-12-01 DIAGNOSIS — Z9013 Acquired absence of bilateral breasts and nipples: Secondary | ICD-10-CM | POA: Insufficient documentation

## 2014-12-01 DIAGNOSIS — Z452 Encounter for adjustment and management of vascular access device: Secondary | ICD-10-CM | POA: Insufficient documentation

## 2014-12-01 DIAGNOSIS — I10 Essential (primary) hypertension: Secondary | ICD-10-CM | POA: Insufficient documentation

## 2014-12-01 DIAGNOSIS — J449 Chronic obstructive pulmonary disease, unspecified: Secondary | ICD-10-CM | POA: Insufficient documentation

## 2014-12-01 DIAGNOSIS — E119 Type 2 diabetes mellitus without complications: Secondary | ICD-10-CM | POA: Insufficient documentation

## 2014-12-01 DIAGNOSIS — Z87891 Personal history of nicotine dependence: Secondary | ICD-10-CM | POA: Insufficient documentation

## 2014-12-01 DIAGNOSIS — K219 Gastro-esophageal reflux disease without esophagitis: Secondary | ICD-10-CM | POA: Insufficient documentation

## 2014-12-01 DIAGNOSIS — Z853 Personal history of malignant neoplasm of breast: Secondary | ICD-10-CM | POA: Insufficient documentation

## 2014-12-01 DIAGNOSIS — Z79899 Other long term (current) drug therapy: Secondary | ICD-10-CM | POA: Insufficient documentation

## 2014-12-01 HISTORY — PX: PORT-A-CATH REMOVAL: SHX5289

## 2014-12-01 LAB — GLUCOSE, CAPILLARY
GLUCOSE-CAPILLARY: 125 mg/dL — AB (ref 65–99)
GLUCOSE-CAPILLARY: 114 mg/dL — AB (ref 65–99)

## 2014-12-01 SURGERY — REMOVAL PORT-A-CATH
Anesthesia: Monitor Anesthesia Care | Site: Chest | Laterality: Left

## 2014-12-01 MED ORDER — LIDOCAINE HCL (PF) 1 % IJ SOLN
INTRAMUSCULAR | Status: AC
  Filled 2014-12-01: qty 30

## 2014-12-01 MED ORDER — GLYCOPYRROLATE 0.2 MG/ML IJ SOLN: 0.2000 mg | Freq: Once | INTRAMUSCULAR | Status: DC | PRN

## 2014-12-01 MED ORDER — MEPERIDINE HCL 25 MG/ML IJ SOLN: 6.2500 mg | INTRAMUSCULAR | Status: DC | PRN

## 2014-12-01 MED ORDER — PROPOFOL 10 MG/ML IV BOLUS
INTRAVENOUS | Status: AC
  Filled 2014-12-01: qty 20

## 2014-12-01 MED ORDER — SCOPOLAMINE 1 MG/3DAYS TD PT72
1.0000 | MEDICATED_PATCH | Freq: Once | TRANSDERMAL | Status: DC | PRN
Start: 2014-12-01 — End: 2014-12-01

## 2014-12-01 MED ORDER — LIDOCAINE-EPINEPHRINE (PF) 1 %-1:200000 IJ SOLN
INTRAMUSCULAR | Status: DC | PRN
  Administered 2014-12-01: 12.5 mL

## 2014-12-01 MED ORDER — BUPIVACAINE-EPINEPHRINE (PF) 0.25% -1:200000 IJ SOLN
INTRAMUSCULAR | Status: AC
  Filled 2014-12-01: qty 30

## 2014-12-01 MED ORDER — PROMETHAZINE HCL 25 MG/ML IJ SOLN: 6.2500 mg | INTRAMUSCULAR | Status: DC | PRN

## 2014-12-01 MED ORDER — MIDAZOLAM HCL 2 MG/2ML IJ SOLN: 0.5000 mg | Freq: Once | INTRAMUSCULAR | Status: DC | PRN

## 2014-12-01 MED ORDER — LIDOCAINE-EPINEPHRINE (PF) 1 %-1:200000 IJ SOLN
INTRAMUSCULAR | Status: AC
  Filled 2014-12-01: qty 30

## 2014-12-01 MED ORDER — LACTATED RINGERS IV SOLN
INTRAVENOUS | Status: DC
  Administered 2014-12-01: 10 mL/h via INTRAVENOUS

## 2014-12-01 MED ORDER — ONDANSETRON HCL 4 MG/2ML IJ SOLN
INTRAMUSCULAR | Status: AC
  Filled 2014-12-01: qty 2

## 2014-12-01 MED ORDER — FENTANYL CITRATE (PF) 100 MCG/2ML IJ SOLN: 25.0000 ug | INTRAMUSCULAR | Status: DC | PRN

## 2014-12-01 MED ORDER — CHLORHEXIDINE GLUCONATE 4 % EX LIQD: 1.0000 "application " | Freq: Once | CUTANEOUS | Status: DC

## 2014-12-01 MED ORDER — FENTANYL CITRATE (PF) 100 MCG/2ML IJ SOLN
50.0000 ug | INTRAMUSCULAR | Status: DC | PRN
Start: 2014-12-01 — End: 2014-12-01

## 2014-12-01 MED ORDER — BUPIVACAINE HCL (PF) 0.25 % IJ SOLN
INTRAMUSCULAR | Status: AC
  Filled 2014-12-01: qty 30

## 2014-12-01 MED ORDER — MIDAZOLAM HCL 2 MG/2ML IJ SOLN: 1.0000 mg | INTRAMUSCULAR | Status: DC | PRN

## 2014-12-01 MED ORDER — HYDROCODONE-ACETAMINOPHEN 5-325 MG PO TABS: 1.0000 | ORAL_TABLET | Freq: Four times a day (QID) | ORAL | Status: DC | PRN

## 2014-12-01 MED ORDER — FENTANYL CITRATE (PF) 100 MCG/2ML IJ SOLN
INTRAMUSCULAR | Status: AC
  Filled 2014-12-01: qty 4

## 2014-12-01 MED ORDER — MIDAZOLAM HCL 2 MG/2ML IJ SOLN
INTRAMUSCULAR | Status: AC
  Filled 2014-12-01: qty 4

## 2014-12-01 SURGICAL SUPPLY — 29 items
BLADE SURG 15 STRL LF DISP TIS (BLADE) ×1 IMPLANT
BLADE SURG 15 STRL SS (BLADE) ×3
CHLORAPREP W/TINT 26ML (MISCELLANEOUS) ×3 IMPLANT
COVER BACK TABLE 60X90IN (DRAPES) ×3 IMPLANT
COVER MAYO STAND STRL (DRAPES) ×3 IMPLANT
DECANTER SPIKE VIAL GLASS SM (MISCELLANEOUS) ×1 IMPLANT
DRAPE LAPAROTOMY 100X72 PEDS (DRAPES) ×3 IMPLANT
DRAPE UTILITY XL STRL (DRAPES) ×3 IMPLANT
ELECT COATED BLADE 2.86 ST (ELECTRODE) IMPLANT
ELECT REM PT RETURN 9FT ADLT (ELECTROSURGICAL)
ELECTRODE REM PT RTRN 9FT ADLT (ELECTROSURGICAL) IMPLANT
GLOVE BIO SURGEON STRL SZ7.5 (GLOVE) ×3 IMPLANT
GLOVE BIOGEL M 7.0 STRL (GLOVE) ×2 IMPLANT
GLOVE BIOGEL PI IND STRL 7.5 (GLOVE) IMPLANT
GLOVE BIOGEL PI INDICATOR 7.5 (GLOVE) ×2
GOWN STRL REUS W/ TWL LRG LVL3 (GOWN DISPOSABLE) ×2 IMPLANT
GOWN STRL REUS W/TWL LRG LVL3 (GOWN DISPOSABLE) ×6
LIQUID BAND (GAUZE/BANDAGES/DRESSINGS) ×3 IMPLANT
NDL HYPO 25X1 1.5 SAFETY (NEEDLE) ×1 IMPLANT
NEEDLE HYPO 25X1 1.5 SAFETY (NEEDLE) ×3 IMPLANT
PACK BASIN DAY SURGERY FS (CUSTOM PROCEDURE TRAY) ×3 IMPLANT
PENCIL BUTTON HOLSTER BLD 10FT (ELECTRODE) IMPLANT
SLEEVE SCD COMPRESS KNEE MED (MISCELLANEOUS) IMPLANT
SUT MON AB 4-0 PC3 18 (SUTURE) ×3 IMPLANT
SUT VIC AB 3-0 SH 27 (SUTURE) ×3
SUT VIC AB 3-0 SH 27X BRD (SUTURE) ×1 IMPLANT
SYR CONTROL 10ML LL (SYRINGE) ×3 IMPLANT
TOWEL OR 17X24 6PK STRL BLUE (TOWEL DISPOSABLE) ×3 IMPLANT
TOWEL OR NON WOVEN STRL DISP B (DISPOSABLE) ×3 IMPLANT

## 2014-12-01 NOTE — H&P (Signed)
[name] 05/01/2014 2:23 PM Location: [facility] Patient #: [id number] DOB: [birth year] Single / Language: [name] / Race: White Female  History of Present Illness [name]. Heidi Starks MD; 05/02/2014 8:59 AM) Patient words: 6 month breast check.  The patient is a 64 year old female who presents for a follow-up for Breast cancer. The patient is a 64 year old white female who is one year status post bilateral mastectomies and right sentinel lymph node biopsy for a T1 CN 0 right breast cancer. She was ER and PR negative and HER-2 positive. She has finished Herceptin therapy. She also just finished for reconstruction. She has no complaints today. Her oncologist would like her port to stay in for probably another 6 months.   Other Problems (Heidi Eversole, LPN; 4/43/1540 0:86 PM) Breast Cancer Diabetes Mellitus High blood pressure  Past Surgical History (Heidi Eversole, LPN; 7/61/9509 3:26 PM) Appendectomy Breast Biopsy Right. Gallbladder Surgery - Laparoscopic Hysterectomy (not due to cancer) - Complete Mastectomy Bilateral. Oral Surgery Sentinel Lymph Node Biopsy  Diagnostic Studies History (Heidi Eversole, LPN; 09/12/4578 9:98 PM) Colonoscopy 1-5 years ago Mammogram 1-3 years ago Pap Smear 1-5 years ago  Allergies (Heidi Eversole, LPN; 3/38/2505 3:97 PM) ACE Inhibitors Codeine and Related  Medication History (Heidi Eversole, LPN; 6/73/4193 7:90 PM) Lopressor (50MG  Tablet, Oral) Active. MetFORMIN HCl (500MG  Tablet, Oral) Active. PROzac (10MG  Tablet, Oral) Active. Maxzide-25 (37.5-25MG  Tablet, Oral) Active. Tylenol (500MG  Capsule, Oral) Active.  Social History ([name], LPN; 2/40/9735 3:29 PM) Alcohol use Remotely quit alcohol use. No caffeine use No drug use Tobacco use Former smoker.  Family History [name], LPN; 11/24/2681 4:19 PM) Alcohol Abuse Father. Arthritis Mother. Diabetes Mellitus  Father. Hypertension Mother.  Pregnancy / Birth History [name], LPN; 08/22/2977 8:92 PM) Age at menarche 7 years. Gravida 0 Para 0  Review of Systems (Heidi Eversole LPN; 03/21/4172 0:81 PM) General Not Present- Appetite Loss, Chills, Fatigue, Fever, Night Sweats, Weight Gain and Weight Loss. Skin Present- Dryness. Not Present- Change in Wart/Mole, Hives, Jaundice, New Lesions, Non-Healing Wounds, Rash and Ulcer. HEENT Present- Seasonal Allergies and Wears glasses/contact lenses. Not Present- Earache, Hearing Loss, Hoarseness, Nose Bleed, Oral Ulcers, Ringing in the Ears, Sinus Pain, Sore Throat, Visual Disturbances and Yellow Eyes. Respiratory Not Present- Bloody sputum, Chronic Cough, Difficulty Breathing, Snoring and Wheezing. Breast Not Present- Breast Mass, Breast Pain, Nipple Discharge and Skin Changes. Cardiovascular Not Present- Chest Pain, Difficulty Breathing Lying Down, Leg Cramps, Palpitations, Rapid Heart Rate, Shortness of Breath and Swelling of Extremities. Gastrointestinal Not Present- Abdominal Pain, Bloating, Bloody Stool, Change in Bowel Habits, Chronic diarrhea, Constipation, Difficulty Swallowing, Excessive gas, Gets full quickly at meals, Hemorrhoids, Indigestion, Nausea, Rectal Pain and Vomiting. Female Genitourinary Not Present- Frequency, Nocturia, Painful Urination, Pelvic Pain and Urgency. Musculoskeletal Not Present- Back Pain, Joint Pain, Joint Stiffness, Muscle Pain, Muscle Weakness and Swelling of Extremities. Neurological Not Present- Decreased Memory, Fainting, Headaches, Numbness, Seizures, Tingling, Tremor, Trouble walking and Weakness. Psychiatric Not Present- Anxiety, Bipolar, Change in Sleep Pattern, Depression, Fearful and Frequent crying. Endocrine Present- New Diabetes. Not Present- Cold Intolerance, Excessive Hunger, Hair Changes, Heat Intolerance and Hot flashes. Hematology Not Present- Easy Bruising, Excessive bleeding, Gland problems, HIV  and Persistent Infections.   Physical Exam [name] S. Heidi Starks MD; 05/02/2014 9:00 AM) General Mental Status-Alert. General Appearance-Consistent with stated age. Hydration-Well hydrated. Voice-Normal.  Head and Neck Head-normocephalic, atraumatic with no lesions or palpable masses. Trachea-midline. Thyroid Gland Characteristics - normal size and consistency.  Eye Eyeball - Bilateral-Extraocular movements  intact. Sclera/Conjunctiva - Bilateral-No scleral icterus.  Chest and Lung Exam Chest and lung exam reveals -quiet, even and easy respiratory effort with no use of accessory muscles and on auscultation, normal breath sounds, no adventitious sounds and normal vocal resonance. Inspection Chest Wall - Normal. Back - normal.  Breast Note: There is no palpable mass in either reconstructed breasts. There is no palpable axillary, supraclavicular, or cervical lymphadenopathy   Cardiovascular Cardiovascular examination reveals -normal heart sounds, regular rate and rhythm with no murmurs and normal pedal pulses bilaterally.  Abdomen Inspection Inspection of the abdomen reveals - No Hernias. Skin - Scar - no surgical scars. Palpation/Percussion Palpation and Percussion of the abdomen reveal - Soft, Non Tender, No Rebound tenderness, No Rigidity (guarding) and No hepatosplenomegaly. Auscultation Auscultation of the abdomen reveals - Bowel sounds normal.  Neurologic Neurologic evaluation reveals -alert and oriented x 3 with no impairment of recent or remote memory. Mental Status-Normal.  Musculoskeletal Normal Exam - Left-Upper Extremity Strength Normal and Lower Extremity Strength Normal. Normal Exam - Right-Upper Extremity Strength Normal and Lower Extremity Strength Normal.  Lymphatic Head & Neck  General Head & Neck Lymphatics: Bilateral - Description - Normal. Axillary  General Axillary Region: Bilateral - Description - Normal. Tenderness - Non  Tender. Femoral & Inguinal  Generalized Femoral & Inguinal Lymphatics: Bilateral - Description - Normal. Tenderness - Non Tender.    Assessment & Plan [name] S. Heidi Starks MD; 05/01/2014 3:00 PM) PRIMARY CANCER OF RIGHT FEMALE BREAST (174.9  C50.911) Impression: The patient is 1 year status post bilateral mastectomies for a right-sided breast cancer. She has finished Herceptin therapy as well as her reconstruction. At this point she will continue to do regular self exams. I will plan to see her back in 6 months. Current Plans  Follow up in 6 months or as needed   Signed by Luella Cook, MD (05/02/2014 9:00 AM)

## 2014-12-01 NOTE — Op Note (Signed)
12/01/2014  12:31 PM  PATIENT:  [name]  64 y.o. female  PRE-OPERATIVE DIAGNOSIS:  Breast Cancer  POST-OPERATIVE DIAGNOSIS:  Breast Cancer  PROCEDURE:  Procedure(s): REMOVAL PORT-A-CATH (Left)  SURGEON:  Surgeon(s) and Role:    * Jovita Kussmaul, MD - Primary  PHYSICIAN ASSISTANT:   ASSISTANTS: none   ANESTHESIA:   local  EBL:  Total I/O In: 300 [I.V.:300] Out: -   BLOOD ADMINISTERED:none  DRAINS: none   LOCAL MEDICATIONS USED:  LIDOCAINE   SPECIMEN:  No Specimen  DISPOSITION OF SPECIMEN:  N/A  COUNTS:  YES  TOURNIQUET:  * No tourniquets in log *  DICTATION: .Dragon Dictation   After informed consent was obtained the patient was brought to the operating room and placed in the supine position on the operating room table. Patient's left chest wall was prepped with ChloraPrep, allowed to dry, and draped in usual sterile manner. The area around the port was infiltrated with 1% lidocaine with epinephrine until a good field block was created. A small incision was made with 15 blade knife through her old incision. The incision was carried through the subcutaneous tissue sharply with the 15 blade knife until the port was identified. The capsule surrounding the port was opened sharply with the 15 blade knife. The 2 anchoring stitches were divided and removed. The port was then gently pushed out of its pocket and with gentle traction was removed without difficulty. Pressure was held for several minutes until the area was completely hemostatic. The deep layer of the wound was then closed with interrupted 3-0 Vicryl stitches. The skin was then closed with interrupted 4-0 Monocryl subcuticular stitches. Dermabond dressings were applied. The patient tolerated the procedure well. At the end of the case all needle sponge and instrument counts were correct. The patient was then awakened and taken to recovery in stable condition.  PLAN OF CARE: Discharge to home after  PACU  PATIENT DISPOSITION:  PACU - hemodynamically stable.   Delay start of Pharmacological VTE agent (>24hrs) due to surgical blood loss or risk of bleeding: not applicable

## 2014-12-01 NOTE — Interval H&P Note (Signed)
History and Physical Interval Note:  12/01/2014 11:46 AM  [name]  has presented today for surgery, with the diagnosis of Breast Cancer  The various methods of treatment have been discussed with the patient and family. After consideration of risks, benefits and other options for treatment, the patient has consented to  Procedure(s): REMOVAL PORT-A-CATH (N/A) as a surgical intervention .  The patient's history has been reviewed, patient examined, no change in status, stable for surgery.  I have reviewed the patient's chart and labs.  Questions were answered to the patient's satisfaction.     TOTH III,Madlynn Lundeen S

## 2014-12-01 NOTE — Anesthesia Preprocedure Evaluation (Addendum)
Anesthesia Evaluation  Patient identified by MRN, date of birth, ID band Patient awake    Reviewed: Allergy & Precautions, NPO status , Patient's Chart, lab work & pertinent test results, reviewed documented beta blocker date and time   History of Anesthesia Complications (+) PONV and history of anesthetic complications  Airway Mallampati: I  TM Distance: >3 FB Neck ROM: Full    Dental  (+) Edentulous Upper, Partial Lower, Dental Advisory Given   Pulmonary COPD, former smoker (quit 1989),    breath sounds clear to auscultation       Cardiovascular hypertension, Pt. on medications and Pt. on home beta blockers (-) angina Rhythm:Regular Rate:Normal  '16 ECHO ([facility]): totally normal (as per patient) '06 ECHO: normal LVF and valves   Neuro/Psych Anxiety Depression negative neurological ROS     GI/Hepatic Neg liver ROS, GERD  Controlled,  Endo/Other  diabetes, Oral Hypoglycemic AgentsMorbid obesity  Renal/GU negative Renal ROS     Musculoskeletal  (+) Arthritis ,   Abdominal (+) + obese,   Peds  Hematology negative hematology ROS (+)   Anesthesia Other Findings Breast cancer: chemo  Reproductive/Obstetrics                           Anesthesia Physical Anesthesia Plan  ASA: III  Anesthesia Plan: MAC   Post-op Pain Management:    Induction:   Airway Management Planned: Natural Airway and Nasal Cannula  Additional Equipment:   Intra-op Plan:   Post-operative Plan:   Informed Consent: I have reviewed the patients History and Physical, chart, labs and discussed the procedure including the risks, benefits and alternatives for the proposed anesthesia with the patient or authorized representative who has indicated his/her understanding and acceptance.   Dental advisory given  Plan Discussed with: CRNA and Surgeon  Anesthesia Plan Comments: (Plan routine monitors, MAC)         Anesthesia Quick Evaluation

## 2014-12-01 NOTE — Transfer of Care (Signed)
Immediate Anesthesia Transfer of Care Note  Patient: [name]  Procedure(s) Performed: Procedure(s): REMOVAL PORT-A-CATH (Left)  Patient Location: PACU  Anesthesia Type:MAC  Level of Consciousness: awake, alert , oriented and patient cooperative  Airway & Oxygen Therapy: Patient Spontanous Breathing  Post-op Assessment: Report given to RN, Post -op Vital signs reviewed and stable and Patient moving all extremities  Post vital signs: Reviewed and stable  Last Vitals:  Filed Vitals:   12/01/14 1300  BP: 154/86  Pulse: 86  Temp: 36.7 C  Resp: 18    Complications: No apparent anesthesia complications

## 2014-12-01 NOTE — Anesthesia Postprocedure Evaluation (Signed)
  Anesthesia Post-op Note  Patient: [name]  Procedure(s) Performed: Procedure(s): REMOVAL PORT-A-CATH (Left)  Patient Location: PACU  Anesthesia Type:MAC  Level of Consciousness: awake, alert , oriented and patient cooperative  Airway and Oxygen Therapy: Patient Spontanous Breathing  Post-op Pain: none  Post-op Assessment: Post-op Vital signs reviewed, Patient's Cardiovascular Status Stable, Respiratory Function Stable, Patent Airway, No signs of Nausea or vomiting and Pain level controlled              Post-op Vital Signs: Reviewed and stable  Last Vitals:  Filed Vitals:   12/01/14 1300  BP: 154/86  Pulse: 86  Temp: 36.7 C  Resp: 18    Complications: No apparent anesthesia complications

## 2014-12-04 ENCOUNTER — Encounter (HOSPITAL_BASED_OUTPATIENT_CLINIC_OR_DEPARTMENT_OTHER): Payer: Self-pay | Admitting: General Surgery

## 2014-12-20 ENCOUNTER — Other Ambulatory Visit: Payer: Self-pay | Admitting: Internal Medicine

## 2014-12-20 NOTE — Telephone Encounter (Signed)
Sent to the pharmacy by e-scribe. 

## 2015-01-08 DIAGNOSIS — C50911 Malignant neoplasm of unspecified site of right female breast: Secondary | ICD-10-CM | POA: Diagnosis not present

## 2015-01-15 ENCOUNTER — Other Ambulatory Visit (INDEPENDENT_AMBULATORY_CARE_PROVIDER_SITE_OTHER): Payer: BLUE CROSS/BLUE SHIELD

## 2015-01-15 DIAGNOSIS — Z0000 Encounter for general adult medical examination without abnormal findings: Secondary | ICD-10-CM

## 2015-01-15 LAB — CBC WITH DIFFERENTIAL/PLATELET
BASOS PCT: 0.4 % (ref 0.0–3.0)
Basophils Absolute: 0 10*3/uL (ref 0.0–0.1)
EOS PCT: 3 % (ref 0.0–5.0)
Eosinophils Absolute: 0.2 10*3/uL (ref 0.0–0.7)
HEMATOCRIT: 43.2 % (ref 36.0–46.0)
Hemoglobin: 14.4 g/dL (ref 12.0–15.0)
LYMPHS ABS: 1.6 10*3/uL (ref 0.7–4.0)
LYMPHS PCT: 27.8 % (ref 12.0–46.0)
MCHC: 33.3 g/dL (ref 30.0–36.0)
MCV: 88.2 fl (ref 78.0–100.0)
MONOS PCT: 10.2 % (ref 3.0–12.0)
Monocytes Absolute: 0.6 10*3/uL (ref 0.1–1.0)
NEUTROS ABS: 3.3 10*3/uL (ref 1.4–7.7)
NEUTROS PCT: 58.6 % (ref 43.0–77.0)
PLATELETS: 185 10*3/uL (ref 150.0–400.0)
RBC: 4.89 Mil/uL (ref 3.87–5.11)
RDW: 13.8 % (ref 11.5–15.5)
WBC: 5.7 10*3/uL (ref 4.0–10.5)

## 2015-01-15 LAB — LIPID PANEL
CHOLESTEROL: 200 mg/dL (ref 0–200)
HDL: 45.8 mg/dL (ref >39.00)
LDL Cholesterol: 121 mg/dL — ABNORMAL HIGH (ref 0–99)
NONHDL: 154.39
Total CHOL/HDL Ratio: 4
Triglycerides: 168 mg/dL — ABNORMAL HIGH (ref 0.0–149.0)
VLDL: 33.6 mg/dL (ref 0.0–40.0)

## 2015-01-15 LAB — BASIC METABOLIC PANEL
BUN: 18 mg/dL (ref 6–23)
CHLORIDE: 103 meq/L (ref 96–112)
CO2: 28 meq/L (ref 19–32)
Calcium: 9.5 mg/dL (ref 8.4–10.5)
Creatinine, Ser: 0.77 mg/dL (ref 0.40–1.20)
GFR: 80.15 mL/min (ref >60.00)
GLUCOSE: 147 mg/dL — AB (ref 70–99)
Potassium: 4 mEq/L (ref 3.5–5.1)
SODIUM: 142 meq/L (ref 135–145)

## 2015-01-15 LAB — HEPATIC FUNCTION PANEL
ALBUMIN: 4 g/dL (ref 3.5–5.2)
ALK PHOS: 69 U/L (ref 39–117)
ALT: 25 U/L (ref 0–35)
AST: 23 U/L (ref 0–37)
Bilirubin, Direct: 0.2 mg/dL (ref 0.0–0.3)
TOTAL PROTEIN: 6.4 g/dL (ref 6.0–8.3)
Total Bilirubin: 1 mg/dL (ref 0.2–1.2)

## 2015-01-15 LAB — HEMOGLOBIN A1C: Hgb A1c MFr Bld: 6.9 % — ABNORMAL HIGH (ref 4.6–6.5)

## 2015-01-15 LAB — TSH: TSH: 3.35 u[IU]/mL (ref 0.35–4.50)

## 2015-01-22 ENCOUNTER — Ambulatory Visit (INDEPENDENT_AMBULATORY_CARE_PROVIDER_SITE_OTHER): Payer: BLUE CROSS/BLUE SHIELD | Admitting: Internal Medicine

## 2015-01-22 ENCOUNTER — Encounter: Payer: Self-pay | Admitting: Internal Medicine

## 2015-01-22 VITALS — BP 128/80 | Temp 97.6°F | Ht 63.0 in | Wt 227.3 lb

## 2015-01-22 DIAGNOSIS — E785 Hyperlipidemia, unspecified: Secondary | ICD-10-CM

## 2015-01-22 DIAGNOSIS — Z0000 Encounter for general adult medical examination without abnormal findings: Secondary | ICD-10-CM

## 2015-01-22 DIAGNOSIS — Z853 Personal history of malignant neoplasm of breast: Secondary | ICD-10-CM

## 2015-01-22 DIAGNOSIS — E119 Type 2 diabetes mellitus without complications: Secondary | ICD-10-CM | POA: Diagnosis not present

## 2015-01-22 DIAGNOSIS — I10 Essential (primary) hypertension: Secondary | ICD-10-CM

## 2015-01-22 DIAGNOSIS — Z79899 Other long term (current) drug therapy: Secondary | ICD-10-CM

## 2015-01-22 LAB — MICROALBUMIN / CREATININE URINE RATIO
CREATININE, U: 87.5 mg/dL
MICROALB/CREAT RATIO: 0.8 mg/g (ref 0.0–30.0)
Microalb, Ur: <0.7 mg/dL (ref 0.0–1.9)

## 2015-01-22 MED ORDER — FLUOXETINE HCL 10 MG PO CAPS: 10.0000 mg | ORAL_CAPSULE | Freq: Every day | ORAL | Status: DC

## 2015-01-22 MED ORDER — TRIAMTERENE-HCTZ 37.5-25 MG PO TABS: 1.0000 | ORAL_TABLET | Freq: Every day | ORAL | Status: DC

## 2015-01-22 MED ORDER — METOPROLOL TARTRATE 50 MG PO TABS: 25.0000 mg | ORAL_TABLET | Freq: Two times a day (BID) | ORAL | Status: DC

## 2015-01-22 NOTE — Assessment & Plan Note (Signed)
[facility] in 4 months

## 2015-01-22 NOTE — Patient Instructions (Addendum)
Try taking the metformin  all at one   2- 500 mg  .. Avoid sweets . Goal of bp is below 140/90 ... Consider  statin medication  . As we discussed .  Ask insurance   About getting a glucometer machine and strips and contact us if we need to send in  A prescription.       Diabetes and Standards of Medical Care Diabetes is complicated. You may find that your diabetes team includes a dietitian, nurse, diabetes educator, eye doctor, and more. To help everyone know what is going on and to help you get the care you deserve, the following schedule of care was developed to help keep you on track. Below are the tests, exams, vaccines, medicines, education, and plans you will need. HbA1c test This test shows how well you have controlled your glucose over the past 2-3 months. It is used to see if your diabetes management plan needs to be adjusted.   It is performed at least 2 times a year if you are meeting treatment goals.  It is performed 4 times a year if therapy has changed or if you are not meeting treatment goals. Blood pressure test  This test is performed at every routine medical visit. The goal is less than 140/90 mm Hg for most people, but 130/80 mm Hg in some cases. Ask your health care provider about your goal. Dental exam  Follow up with the dentist regularly. Eye exam  If you are diagnosed with type 1 diabetes as a child, get an exam upon reaching the age of 75 years or older and having had diabetes for 3-5 years. Yearly eye exams are recommended after that initial eye exam.  If you are diagnosed with type 1 diabetes as an adult, get an exam within 5 years of diagnosis and then yearly.  If you are diagnosed with type 2 diabetes, get an exam as soon as possible after the diagnosis and then yearly. Foot care exam  Visual foot exams are performed at every routine medical visit. The exams check for cuts, injuries, or other problems with the feet.  You should have a complete foot exam  performed every year. This exam includes an inspection of the structure and skin of your feet, a check of the pulses in your feet, and a check of the sensation in your feet.  Type 1 diabetes: The first exam is performed 5 years after diagnosis.  Type 2 diabetes: The first exam is performed at the time of diagnosis.  Check your feet nightly for cuts, injuries, or other problems with your feet. Tell your health care provider if anything is not healing. Kidney function test (urine microalbumin)  This test is performed once a year.  Type 1 diabetes: The first test is performed 5 years after diagnosis.  Type 2 diabetes: The first test is performed at the time of diagnosis.  A serum creatinine and estimated glomerular filtration rate (eGFR) test is done once a year to assess the level of chronic kidney disease (CKD), if present. Lipid profile (cholesterol, HDL, LDL, triglycerides)  Performed every 5 years for most people.  The goal for LDL is less than 100 mg/dL. If you are at high risk, the goal is less than 70 mg/dL.  The goal for HDL is 40 mg/dL-50 mg/dL for men and 50 mg/dL-60 mg/dL for women. An HDL cholesterol of 60 mg/dL or higher gives some protection against heart disease.  The goal for triglycerides is less than  150 mg/dL. Immunizations  The flu (influenza) vaccine is recommended yearly for every person 41 months of age or older who has diabetes.  The pneumonia (pneumococcal) vaccine is recommended for every person 15 years of age or older who has diabetes. Adults 43 years of age or older may receive the pneumonia vaccine as a series of two separate shots.  The hepatitis B vaccine is recommended for adults shortly after they have been diagnosed with diabetes.  The Tdap (tetanus, diphtheria, and pertussis) vaccine should be given:  According to normal childhood vaccination schedules, for children.  Every 10 years, for adults who have diabetes. Diabetes self-management  education  Education is recommended at diagnosis and ongoing as needed. Treatment plan  Your treatment plan is reviewed at every medical visit.   This information is not intended to replace advice given to you by your health care provider. Make sure you discuss any questions you have with your health care provider.   Document Released: 12/15/2008 Document Revised: 03/10/2014 Document Reviewed: 07/20/2012 Elsevier Interactive Patient Education [employer].

## 2015-01-22 NOTE — Assessment & Plan Note (Signed)
Take 1000 mg at one time   Get machine monitoring from insurance  anc contact us for rx if needed. a1c in 6 months here or at other providers

## 2015-01-22 NOTE — Progress Notes (Signed)
Pre visit review using our clinic review tool, if applicable. No additional management support is needed unless otherwise documented below in the visit note.   Chief Complaint  Patient presents with  . Annual Exam  . Diabetes    HPI: Patient  [name]  63 y.o. comes in today for Preventive Health Care visit  And Chronic disease management.  DM; still some sweets   No numbness in feet   Ache   .    Had v veins  .    left done aching legs  To do right eventually   No neuropathy sx often forgets to take second dose of  Metformin works on addition to sugar  Had eye check and ok some cataracts . BP; ok at doctors .   Breast cancer;  Porta cath removed   Fu 4 months has lab from them prozac helpful to her   Health Maintenance  Topic Date Due  . Hepatitis C Screening  [birth year]  . FOOT EXAM  10/16/1960  . OPHTHALMOLOGY EXAM  10/16/1960  . URINE MICROALBUMIN  10/16/1960  . MAMMOGRAM  01/15/2015  . HIV Screening  01/22/2016 (Originally 10/16/1965)  . HEMOGLOBIN A1C  07/15/2015  . INFLUENZA VACCINE  10/02/2015  . COLONOSCOPY  12/05/2018  . TETANUS/TDAP  12/21/2022  . ZOSTAVAX  Completed   Health Maintenance Review LIFESTYLE:  Exercise:   Work  Tobacco/ETS:n Alcohol: per day n Sugar beverages: Sleep: 8 hours  Drug use: no  ROS:  GEN/ HEENT: No fever, significant weight changes sweats headaches vision problems hearing changes, CV/ PULM; No chest pain shortness of breath cough, syncope,edema  change in exercise tolerance. GI /GU: No adominal pain, vomiting, change in bowel habits. No blood in the stool. No significant GU symptoms. Gets  Post chole loose stools urgency at times SKIN/HEME: ,no acute skin rashes suspicious lesions or bleeding. No lymphadenopathy, nodules, masses.  NEURO/ PSYCH:  No neurologic signs such as weakness numbness. No depression anxiety. IMM/ Allergy: No unusual infections.  Allergy .   REST of 12 system review negative except as per  HPI   Past Medical History  Diagnosis Date  . ADJ DISORDER WITH MIXED ANXIETY$RemoveBeforeDEI' \\T'[id number]$ \ DEPRESSED MOOD 11/22/2007  . Other specified disorder of skin 12/25/2008  . 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 NECK PAIN 11/22/2007  . TOBACCO USE, QUIT 12/25/2008  . HYPERTENSION 11/22/2007    echo card 2004 nl lv function   . Ganglion cyst   . Cough due to ACE inhibitor 04/28/2011  . PONV (postoperative nausea and vomiting)   . Depression   . Anxiety   . Arthritis   . Cancer [facility])     right breast cancer  . GERD (gastroesophageal reflux disease)     occ    Past Surgical History  Procedure Laterality Date  . Abdominal hysterectomy  1989  . Wrist surgery  2001  . Appendectomy    . Colonoscopy    . Mastectomy w/ sentinel node biopsy Bilateral 03/10/2013    Procedure: BILATERAL MASTECTOMY WITH RIGHT SENTINEL LYMPH NODE BIOPSY FOLLOWED BY RECONSTRUCTION;  Surgeon: Merrie Roof, MD;  Location: [place];  Service: General;  Laterality: Bilateral;  . Portacath placement Left 03/10/2013    Procedure: INSERTION PORT-A-CATH;  Surgeon: Merrie Roof, MD;  Location: [place];  Service: General;  Laterality: Left;  . Breast reconstruction with placement of tissue expander and flex hd (acellular hydrated dermis) Bilateral 03/10/2013    Procedure: PLACEMENT OF BILATERAL  TISSUE EXPANDER WITH POSSIBLE FLEX HD (ACELLULAR HYDRATED DERMIS) FOR BREAST RECONSTRUCTION;  Surgeon: Crissie Reese, MD;  Location: [place];  Service: Plastics;  Laterality: Bilateral;  . Cholecystectomy  2/15  . Laparoscopic appendectomy    . Removal of tissue expander and placement of implant Left 05/05/2013    Procedure: REMOVAL OF LEFT BREAST TISSUE EXPANDER / incision and drainage of ceroma;  Surgeon: Crissie Reese, MD;  Location: [place];  Service: Plastics;  Laterality: Left;  Marland Kitchen Mastectomy complete / simple w/ sentinel node biopsy Bilateral   . Breast reconstruction Left 12/01/2013    dr Harlow Mares  . Tissue expander placement Left 12/01/2013    Procedure:  TISSUE EXPANDER FOR DELAYED LEFT BREAST RECONSTRUCTION;  Surgeon: Crissie Reese, MD;  Location: [place];  Service: Plastics;  Laterality: Left;  . Breast reconstruction Left 12/01/2013    Procedure:  BREAST RECONSTRUCTION;  Surgeon: Crissie Reese, MD;  Location: [place];  Service: Plastics;  Laterality: Left;  . Port-a-cath removal Left 12/01/2014    Procedure: REMOVAL PORT-A-CATH;  Surgeon: Autumn Messing III, MD;  Location: [place];  Service: General;  Laterality: Left;    Family History  Problem Relation Age of Onset  . Alzheimer's disease Mother   . Hypertension Mother   . Diabetes Other     1st degree relative  . Diabetes Father   . Bladder Cancer Brother     Social History   Social History  . Marital Status: Divorced    Spouse Name: N/A  . Number of Children: N/A  . Years of Education: N/A   Occupational History  . Hairdresser    Social History Main Topics  . Smoking status: Former Smoker -- 1.50 packs/day for 15 years    Types: Cigarettes    Quit date: 03/04/1987  . Smokeless tobacco: Never Used  . Alcohol Use: No  . Drug Use: No  . Sexual Activity: Not Asked   Other Topics Concern  . 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    Former tobacco  No tad     Divorced.       Brother temp , son back in for temporarily.  People      Dog     Outpatient Prescriptions Prior to Visit  Medication Sig Dispense Refill  . metFORMIN (GLUCOPHAGE-XR) 500 MG 24 hr tablet TAKE 2 TABLETS EVERY DAY WITH BREAKFAST 180 tablet 0  . FLUoxetine (PROZAC) 10 MG capsule TAKE ONE CAPSULE BY MOUTH EVERY DAY 90 capsule 1  . metoprolol (LOPRESSOR) 50 MG tablet TAKE 1/2 TABLET TWICE DAILY 90 tablet 0  . triamterene-hydrochlorothiazide (MAXZIDE-25) 37.5-25 MG per tablet TAKE 1 TABLET BY MOUTH DAILY. 90 tablet 0  . HYDROcodone-acetaminophen (NORCO) 5-325 MG tablet Take 1-2 tablets by mouth every  6 (six) hours as needed. 30 tablet 0  . predniSONE (DELTASONE) 10 MG tablet Take pills per day,6,6,6,4,4,4,2,2,2,1,1,1 40 tablet 0  . triamterene-hydrochlorothiazide (MAXZIDE-25) 37.5-25 MG per tablet Take 1 tablet by mouth daily. 90 tablet 0   No facility-administered medications prior to visit.     EXAM:  BP 128/80 mmHg  Temp(Src) 97.6 F (36.4 C) (Oral)  Ht $R'5\' 3"'KB$  (1.6 m)  Wt 227 lb 4.8 oz (103.103 kg)  BMI 40.27 kg/m2  Body mass index is 40.27 kg/(m^2).  Physical Exam: Vital signs reviewed EQA:STMH is a well-developed well-nourished alert  cooperative    who appearsr stated age in no acute distress.  HEENT: normocephalic atraumatic , Eyes: PERRL EOM's full, conjunctiva clear, Nares: paten,t no deformity discharge or tenderness., Ears: no deformity EAC's clear TMs with normal landmarks. Mouth: clear OP, no lesions, edema.  Moist mucous membranes.  NECK: supple without masses, thyromegaly or bruits. CHEST/PULM:  Clear to auscultation and percussion breath sounds equal no wheeze , rales or rhonchi. No chest wall deformities or tenderness. CV: PMI is nondisplaced, S1 S2 no gallops, murmurs, rubs. Peripheral pulses are pressent  without delay.No JVD .  Breast reconstructed axilla clear ABDOMEN: Bowel sounds normal nontender  No guard or rebound, no hepato splenomegal no CVA tenderness.  No hernia. Extremtities:  No clubbing cyanosis or edema, no acute joint swelling or redness no focal atrophy NEURO:  Oriented x3, cranial nerves 3-12 appear to be intact, no obvious focal weakness,gait within normal limits no abnormal reflexes or asymmetrical SKIN: No acute rashes normal turgor, color, no bruising or petechiae. Very dry skin  PSYCH: Oriented, good eye contact, no obvious depression anxiety, cognition and judgment appear normal. LN: no cervical axillary inguinal adenopathy Diabetic Foot Exam - Simple   Simple Foot Form  Diabetic Foot exam was performed with the following findings:  Yes  01/22/2015 11:39 AM  Visual Inspection  No deformities, no ulcerations, no other skin breakdown bilaterally:  Yes  Sensation Testing  Intact to touch and monofilament testing bilaterally:  Yes  Pulse Check  Posterior Tibialis and Dorsalis pulse intact bilaterally:  Yes  Comments  r less than left/  Dry skin no lesions      Lab Results  Component Value Date   WBC 5.7 01/15/2015   HGB 14.4 01/15/2015   HCT 43.2 01/15/2015   PLT 185.0 01/15/2015   GLUCOSE 147* 01/15/2015   CHOL 200 01/15/2015   TRIG 168.0* 01/15/2015   HDL 45.80 01/15/2015   LDLDIRECT 153.4 12/20/2012   LDLCALC 121* 01/15/2015   ALT 25 01/15/2015   AST 23 01/15/2015   NA 142 01/15/2015   K 4.0 01/15/2015   CL 103 01/15/2015   CREATININE 0.77 01/15/2015   BUN 18 01/15/2015   CO2 28 01/15/2015   TSH 3.35 01/15/2015   HGBA1C 6.9* 01/15/2015   BP Readings from Last 3 Encounters:  01/22/15 128/80  12/01/14 154/86  11/27/14 124/68   Wt Readings from Last 3 Encounters:  01/22/15 227 lb 4.8 oz (103.103 kg)  12/01/14 233 lb (105.688 kg)  11/27/14 236 lb (107.049 kg)   Lab from dr Elizebeth Koller reveiwed   Ast  40 ASSESSMENT AND PLAN:  Discussed the following assessment and plan:  Visit for preventive health examination  type 2 diabetes mellitus uncomplicated  - controlled  - Plan: Microalbumin / creatinine urine ratio  Essential hypertension  Hyperlipidemia  Personal history of breast cancer  Medication management   SDm  At this time   Not take statin  112.7 10 year risk  Work on [employer]  Will readdress over time    Patient Care Team: Burnis Medin, MD as PCP - General Delila Pereyra, MD (Obstetrics and Gynecology) Derwood Kaplan, MD as Consulting Physician (Oncology) Patient Instructions  Try taking the metformin  all at one   2- 500 mg  .. Avoid sweets . Goal of bp is below 140/90 ... Consider  statin medication  . As we discussed .  Ask insurance   About getting a glucometer machine and  strips and contact us if we  need to send in  A prescription.       Diabetes and Standards of Medical Care Diabetes is complicated. You may find that your diabetes team includes a dietitian, nurse, diabetes educator, eye doctor, and more. To help everyone know what is going on and to help you get the care you deserve, the following schedule of care was developed to help keep you on track. Below are the tests, exams, vaccines, medicines, education, and plans you will need. HbA1c test This test shows how well you have controlled your glucose over the past 2-3 months. It is used to see if your diabetes management plan needs to be adjusted.   It is performed at least 2 times a year if you are meeting treatment goals.  It is performed 4 times a year if therapy has changed or if you are not meeting treatment goals. Blood pressure test  This test is performed at every routine medical visit. The goal is less than 140/90 mm Hg for most people, but 130/80 mm Hg in some cases. Ask your health care provider about your goal. Dental exam  Follow up with the dentist regularly. Eye exam  If you are diagnosed with type 1 diabetes as a child, get an exam upon reaching the age of 43 years or older and having had diabetes for 3-5 years. Yearly eye exams are recommended after that initial eye exam.  If you are diagnosed with type 1 diabetes as an adult, get an exam within 5 years of diagnosis and then yearly.  If you are diagnosed with type 2 diabetes, get an exam as soon as possible after the diagnosis and then yearly. Foot care exam  Visual foot exams are performed at every routine medical visit. The exams check for cuts, injuries, or other problems with the feet.  You should have a complete foot exam performed every year. This exam includes an inspection of the structure and skin of your feet, a check of the pulses in your feet, and a check of the sensation in your feet.  Type 1 diabetes: The first  exam is performed 5 years after diagnosis.  Type 2 diabetes: The first exam is performed at the time of diagnosis.  Check your feet nightly for cuts, injuries, or other problems with your feet. Tell your health care provider if anything is not healing. Kidney function test (urine microalbumin)  This test is performed once a year.  Type 1 diabetes: The first test is performed 5 years after diagnosis.  Type 2 diabetes: The first test is performed at the time of diagnosis.  A serum creatinine and estimated glomerular filtration rate (eGFR) test is done once a year to assess the level of chronic kidney disease (CKD), if present. Lipid profile (cholesterol, HDL, LDL, triglycerides)  Performed every 5 years for most people.  The goal for LDL is less than 100 mg/dL. If you are at high risk, the goal is less than 70 mg/dL.  The goal for HDL is 40 mg/dL-50 mg/dL for men and 50 mg/dL-60 mg/dL for women. An HDL cholesterol of 60 mg/dL or higher gives some protection against heart disease.  The goal for triglycerides is less than 150 mg/dL. Immunizations  The flu (influenza) vaccine is recommended yearly for every person 61 months of age or older who has diabetes.  The pneumonia (pneumococcal) vaccine is recommended for every person 40 years of age or older who has diabetes. Adults 53 years of age or older may receive the  pneumonia vaccine as a series of two separate shots.  The hepatitis B vaccine is recommended for adults shortly after they have been diagnosed with diabetes.  The Tdap (tetanus, diphtheria, and pertussis) vaccine should be given:  According to normal childhood vaccination schedules, for children.  Every 10 years, for adults who have diabetes. Diabetes self-management education  Education is recommended at diagnosis and ongoing as needed. Treatment plan  Your treatment plan is reviewed at every medical visit.   This information is not intended to replace advice given to  you by your health care provider. Make sure you discuss any questions you have with your health care provider.   Document Released: 12/15/2008 Document Revised: 03/10/2014 Document Reviewed: 07/20/2012 Elsevier Interactive Patient Education 2016 [place] K. Tylea Hise M.D.

## 2015-01-22 NOTE — Assessment & Plan Note (Signed)
sdm     Disc assess risk  Reassess in future hesitant to use statin but will if  Compelling  Lab Results  Component Value Date   CHOL 200 01/15/2015   HDL 45.80 01/15/2015   LDLCALC 121* 01/15/2015   LDLDIRECT 153.4 12/20/2012   TRIG 168.0* 01/15/2015   CHOLHDL 4 01/15/2015

## 2015-04-11 ENCOUNTER — Other Ambulatory Visit: Payer: Self-pay | Admitting: Family Medicine

## 2015-04-11 MED ORDER — FLUOXETINE HCL 10 MG PO CAPS: 10.0000 mg | ORAL_CAPSULE | Freq: Every day | ORAL | Status: DC

## 2015-04-11 MED ORDER — METFORMIN HCL ER 500 MG PO TB24: ORAL_TABLET | ORAL | Status: DC

## 2015-04-11 NOTE — Telephone Encounter (Signed)
Sent to the pharmacy by e-scribe. Pt has upcoming appt on 07/23/15.

## 2015-05-13 IMAGING — US US OUTSIDE FILMS BODY
1 series · 9 of 9 positions shown · non-contrast
Comparison: none

[Series 1: breast · 9 of 9 slices shown]
[im 1/9]
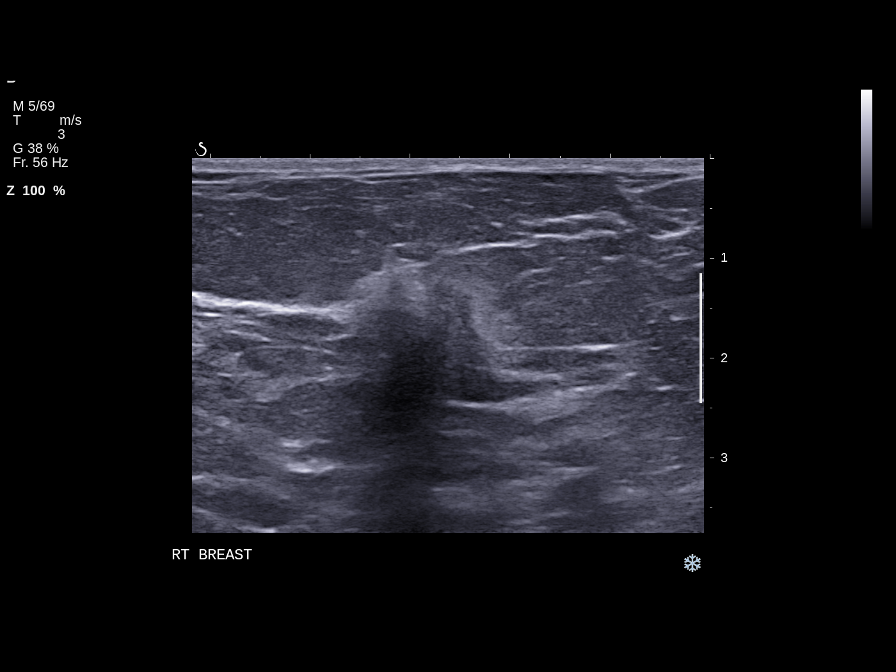
[im 2/9]
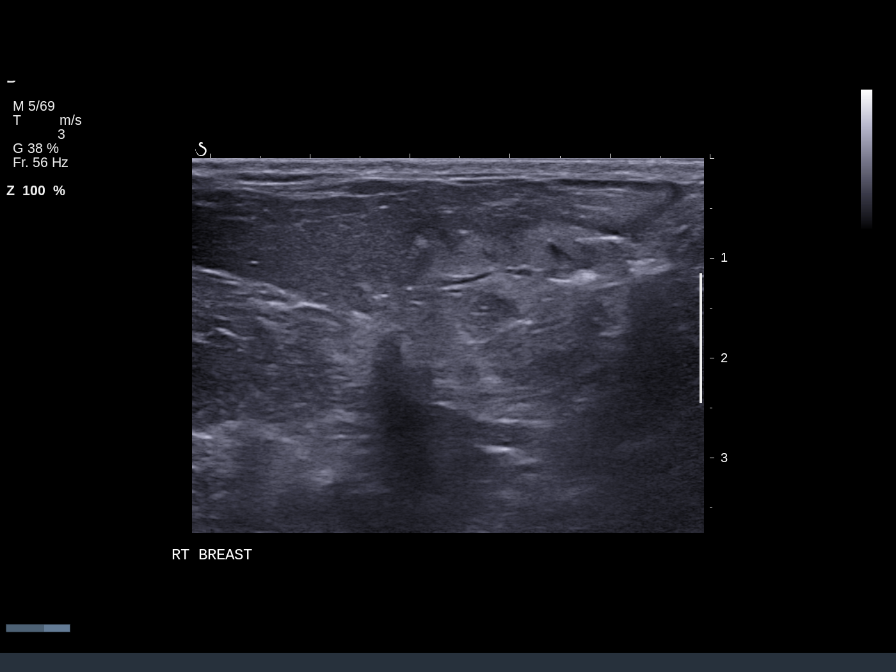
[im 3/9]
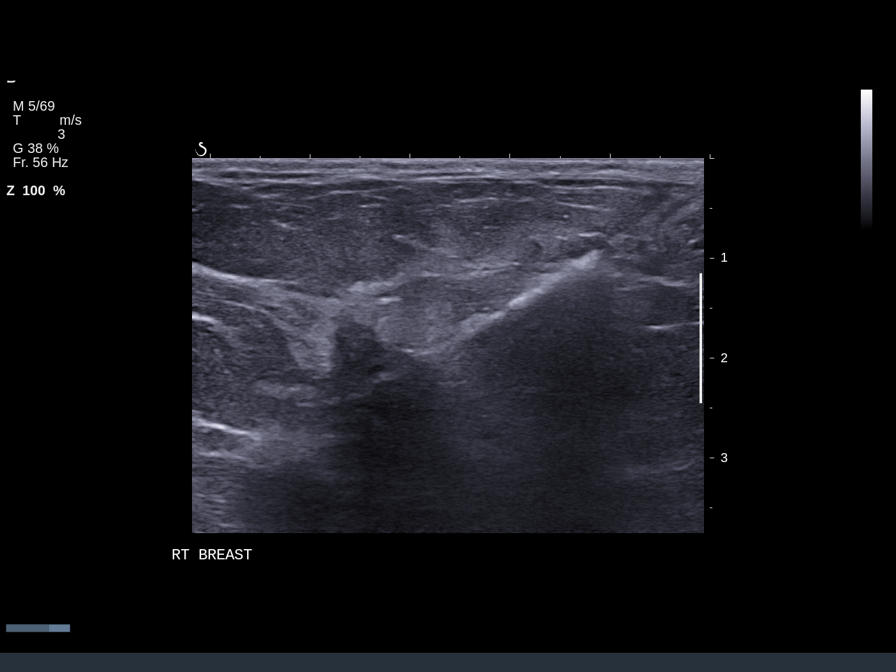
[im 4/9]
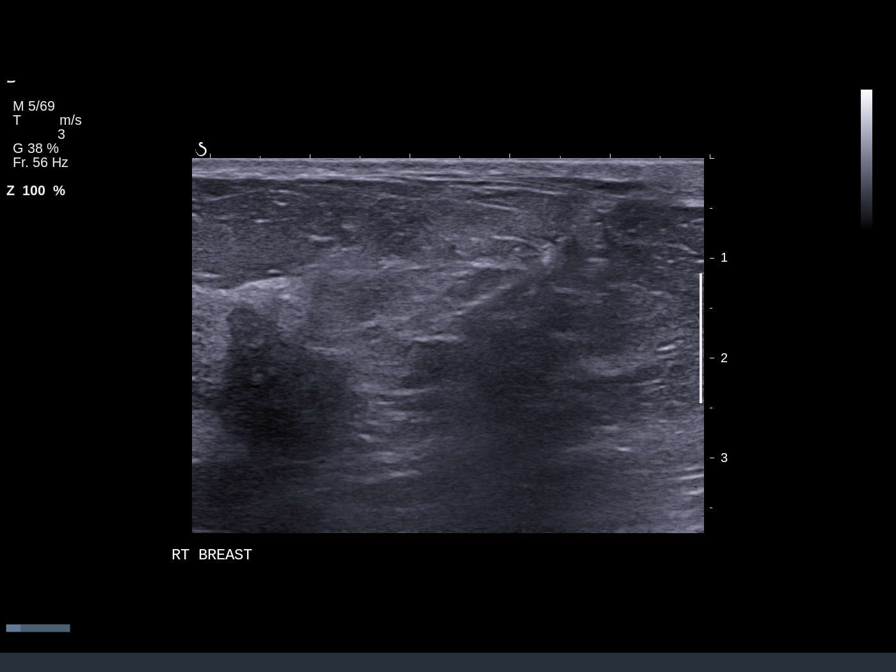
[im 5/9]
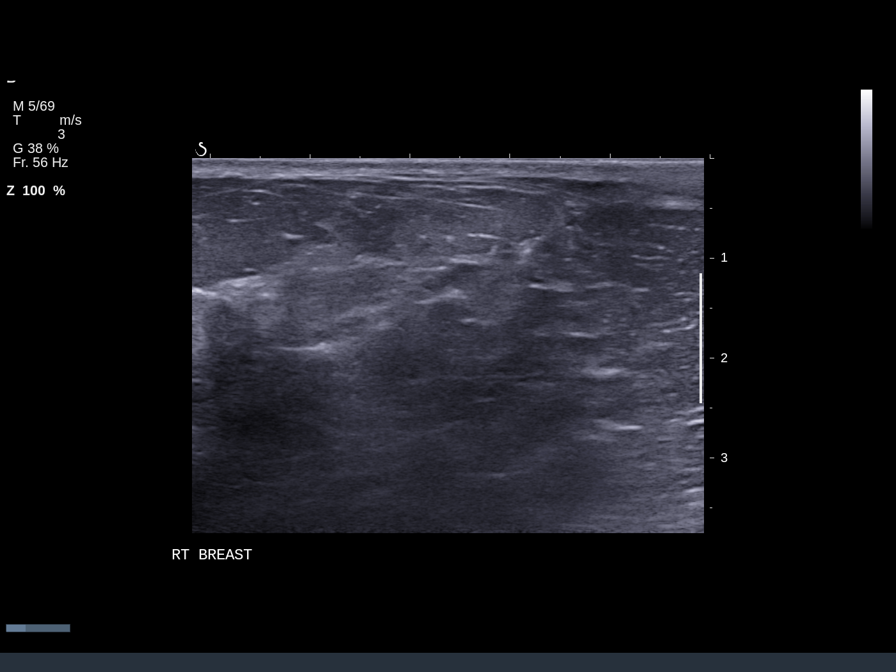
[im 6/9]
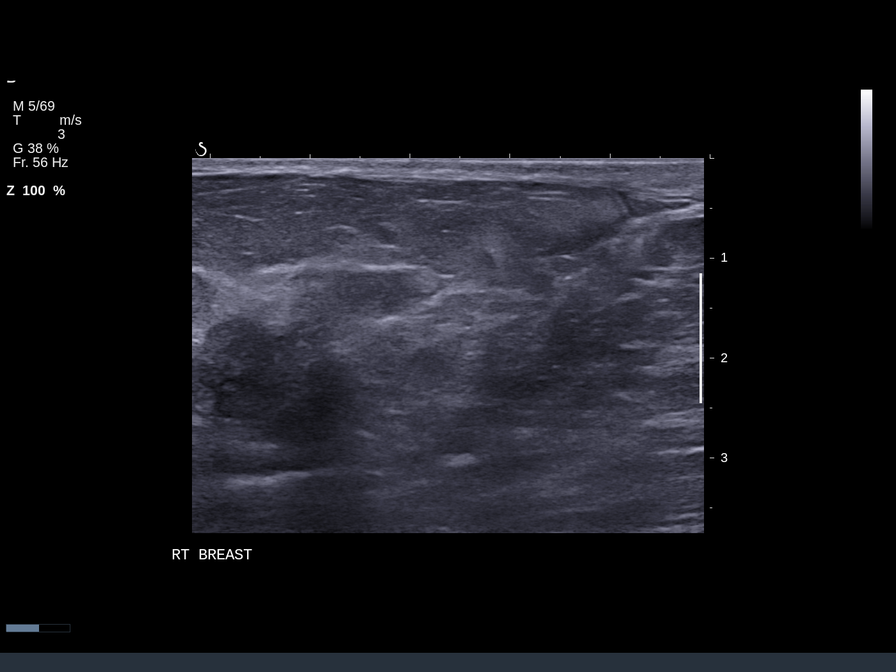
[im 7/9]
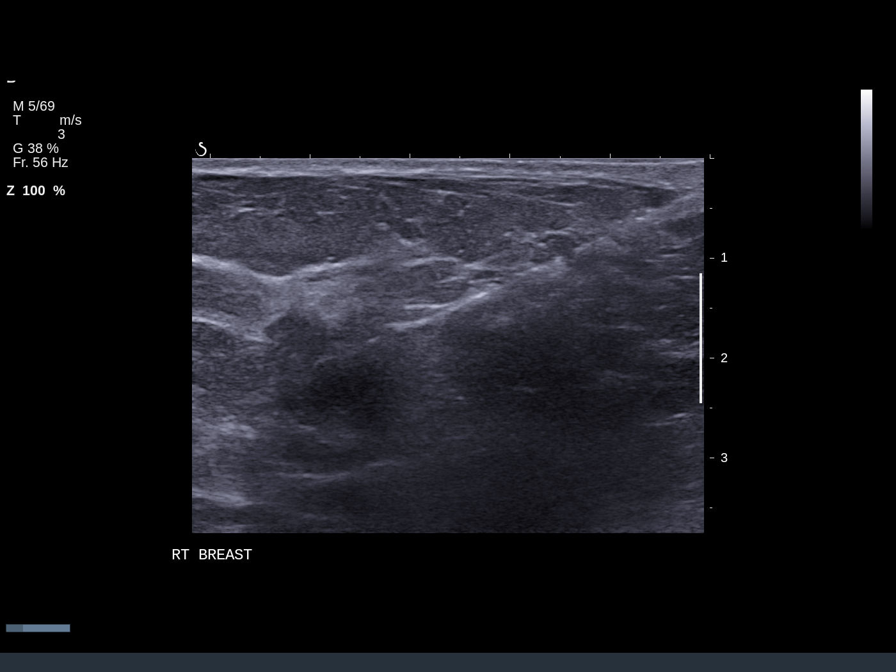
[im 8/9]
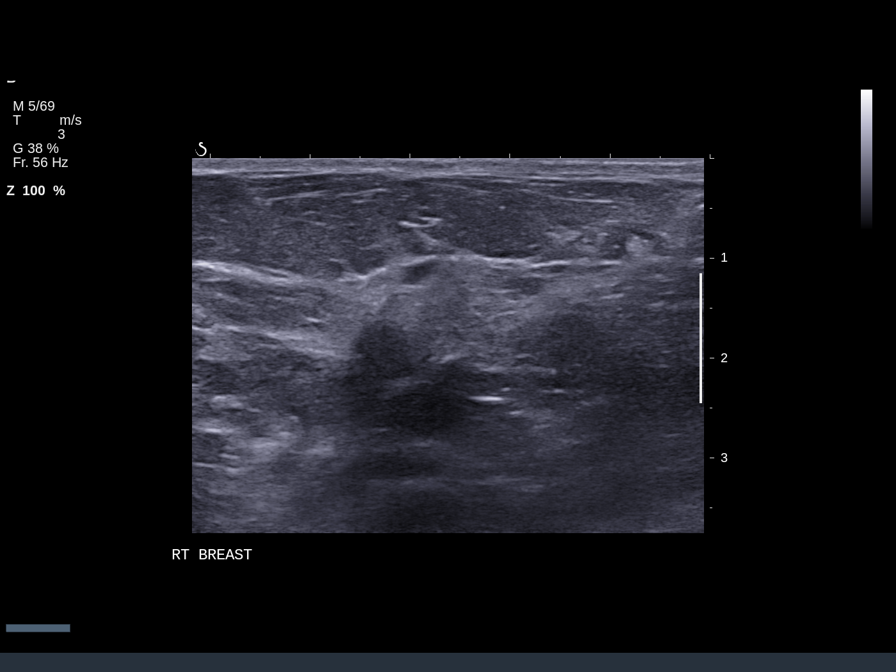
[im 9/9]
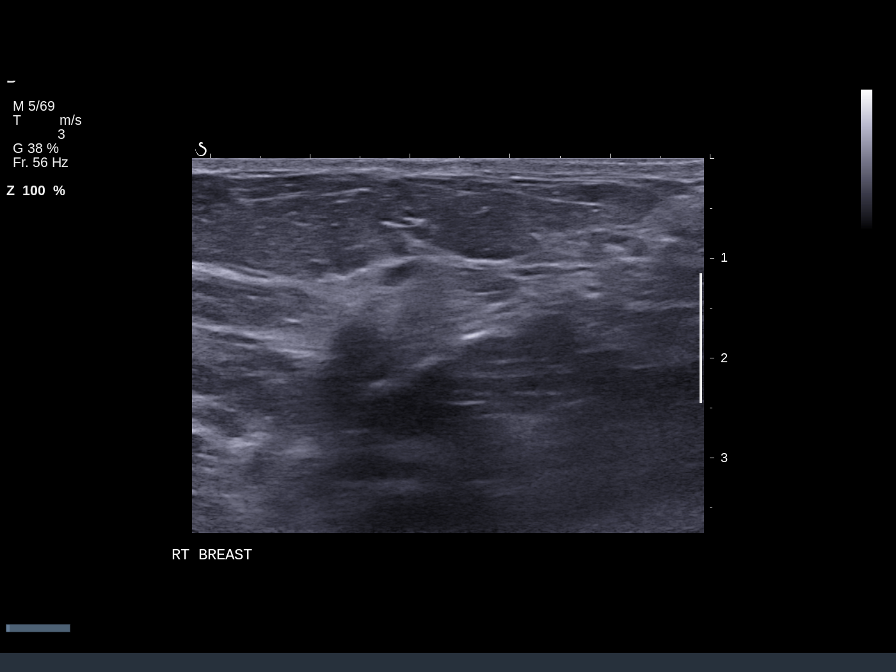

[9 of 9 positions shown; findings below may reference images not displayed]

Canned report from images found in remote index.

Refer to host system for actual result text.

## 2015-05-23 ENCOUNTER — Other Ambulatory Visit: Payer: Self-pay | Admitting: Internal Medicine

## 2015-05-24 NOTE — Telephone Encounter (Signed)
Sent to the pharmacy by e-scribe. 

## 2015-07-23 ENCOUNTER — Ambulatory Visit: Payer: BLUE CROSS/BLUE SHIELD | Admitting: Internal Medicine

## 2015-08-27 ENCOUNTER — Other Ambulatory Visit: Payer: Self-pay | Admitting: Internal Medicine

## 2015-08-29 NOTE — Telephone Encounter (Signed)
Pt has an appt in Aug. Do you need to see her sooner for A1C?

## 2015-08-29 NOTE — Telephone Encounter (Signed)
Sent to the pharmacy by e-scribe. 

## 2015-08-29 NOTE — Telephone Encounter (Signed)
She is due for fu of her diabetes  With a1c   If not done  Ov  And a1c at the visit  [place] to refill   Medication maxide   X 5 months until due for lyearly  check  Lab Results  Component Value Date   WBC 5.7 01/15/2015   HGB 14.4 01/15/2015   HCT 43.2 01/15/2015   PLT 185.0 01/15/2015   GLUCOSE 147* 01/15/2015   CHOL 200 01/15/2015   TRIG 168.0* 01/15/2015   HDL 45.80 01/15/2015   LDLDIRECT 153.4 12/20/2012   LDLCALC 121* 01/15/2015   ALT 25 01/15/2015   AST 23 01/15/2015   NA 142 01/15/2015   K 4.0 01/15/2015   CL 103 01/15/2015   CREATININE 0.77 01/15/2015   BUN 18 01/15/2015   CO2 28 01/15/2015   TSH 3.35 01/15/2015   HGBA1C 6.9* 01/15/2015   MICROALBUR <0.7 01/22/2015

## 2015-10-08 DIAGNOSIS — M7541 Impingement syndrome of right shoulder: Secondary | ICD-10-CM | POA: Diagnosis not present

## 2015-10-08 DIAGNOSIS — M25511 Pain in right shoulder: Secondary | ICD-10-CM | POA: Diagnosis not present

## 2015-10-15 DIAGNOSIS — M7541 Impingement syndrome of right shoulder: Secondary | ICD-10-CM | POA: Diagnosis not present

## 2015-10-15 DIAGNOSIS — M25511 Pain in right shoulder: Secondary | ICD-10-CM | POA: Diagnosis not present

## 2015-10-15 DIAGNOSIS — M6281 Muscle weakness (generalized): Secondary | ICD-10-CM | POA: Diagnosis not present

## 2015-10-17 DIAGNOSIS — M7541 Impingement syndrome of right shoulder: Secondary | ICD-10-CM | POA: Diagnosis not present

## 2015-10-17 DIAGNOSIS — M6281 Muscle weakness (generalized): Secondary | ICD-10-CM | POA: Diagnosis not present

## 2015-10-17 DIAGNOSIS — M25511 Pain in right shoulder: Secondary | ICD-10-CM | POA: Diagnosis not present

## 2015-10-18 ENCOUNTER — Other Ambulatory Visit: Payer: Self-pay | Admitting: Internal Medicine

## 2015-10-18 NOTE — Telephone Encounter (Signed)
Sent to the pharmacy by e-scribe for 90 days.  Pt has upcoming appt on 10/22/15

## 2015-10-22 ENCOUNTER — Encounter: Payer: Self-pay | Admitting: Internal Medicine

## 2015-10-22 ENCOUNTER — Ambulatory Visit (INDEPENDENT_AMBULATORY_CARE_PROVIDER_SITE_OTHER): Payer: Medicare Other | Admitting: Internal Medicine

## 2015-10-22 VITALS — BP 124/76 | Temp 97.9°F | Ht 63.0 in | Wt 230.0 lb

## 2015-10-22 DIAGNOSIS — Z79899 Other long term (current) drug therapy: Secondary | ICD-10-CM

## 2015-10-22 DIAGNOSIS — Z853 Personal history of malignant neoplasm of breast: Secondary | ICD-10-CM | POA: Diagnosis not present

## 2015-10-22 DIAGNOSIS — E119 Type 2 diabetes mellitus without complications: Secondary | ICD-10-CM

## 2015-10-22 DIAGNOSIS — R739 Hyperglycemia, unspecified: Secondary | ICD-10-CM

## 2015-10-22 DIAGNOSIS — M25511 Pain in right shoulder: Secondary | ICD-10-CM | POA: Diagnosis not present

## 2015-10-22 DIAGNOSIS — E785 Hyperlipidemia, unspecified: Secondary | ICD-10-CM

## 2015-10-22 DIAGNOSIS — I10 Essential (primary) hypertension: Secondary | ICD-10-CM | POA: Diagnosis not present

## 2015-10-22 DIAGNOSIS — M7541 Impingement syndrome of right shoulder: Secondary | ICD-10-CM | POA: Diagnosis not present

## 2015-10-22 DIAGNOSIS — C50211 Malignant neoplasm of upper-inner quadrant of right female breast: Secondary | ICD-10-CM | POA: Diagnosis not present

## 2015-10-22 DIAGNOSIS — M6281 Muscle weakness (generalized): Secondary | ICD-10-CM | POA: Diagnosis not present

## 2015-10-22 LAB — POCT GLYCOSYLATED HEMOGLOBIN (HGB A1C): Hemoglobin A1C: 6.7

## 2015-10-22 MED ORDER — FLUOXETINE HCL 10 MG PO CAPS: 10.0000 mg | ORAL_CAPSULE | Freq: Every day | ORAL | 3 refills | Status: DC

## 2015-10-22 MED ORDER — TRIAMTERENE-HCTZ 37.5-25 MG PO TABS: 1.0000 | ORAL_TABLET | Freq: Every day | ORAL | 3 refills | Status: DC

## 2015-10-22 MED ORDER — METFORMIN HCL ER 500 MG PO TB24: ORAL_TABLET | ORAL | 3 refills | Status: DC

## 2015-10-22 MED ORDER — METOPROLOL TARTRATE 50 MG PO TABS: 25.0000 mg | ORAL_TABLET | Freq: Two times a day (BID) | ORAL | 3 refills | Status: DC

## 2015-10-22 NOTE — Progress Notes (Signed)
Chief Complaint  Patient presents with  . Follow-up    HPI: [name] 65 y.o.  Since last visit she's done fine except did have extra sugars went it [birth year] her birthday. Is trying to do lifestyle taking metformin but often forgetting the second one and she is aware. Blood pressure doing okay is in range when she goes to her oncology visits. Does need a refill of medicine. She occasionally forgets the metoprolol second dose. No major vision or numbness noted. Is getting physical therapy for her right shoulder with decreased range of motion and is doing better. Still working full-time. ROS: See pertinent positives and negatives per HPI. Trigger finger still problematic at times. Shot of cortisone in her shoulder helped her hand for whatever reason get some swelling wonders if it's arthritis.  Past Medical History:  Diagnosis Date  . 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 Anxiety   . Arthritis   . 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 Cancer [facility])    right breast cancer  . Cough due to ACE inhibitor 04/28/2011  . Depression   . Ganglion cyst   . GERD (gastroesophageal reflux disease)    occ  . HYPERTENSION 11/22/2007   echo card 2004 nl lv function   . NECK PAIN 11/22/2007  . Other specified disorder of skin 12/25/2008  . PONV (postoperative nausea and vomiting)   . TOBACCO USE, QUIT 12/25/2008    Family History  Problem Relation Age of Onset  . Alzheimer's disease Mother   . Hypertension Mother   . Diabetes Other     1st degree relative  . Diabetes Father   . Bladder Cancer Brother     Social History   Social History  . Marital status: Divorced    Spouse name: N/A  . Number of children: N/A  . Years of education: N/A   Occupational History  . Hairdresser    Social History Main Topics  . Smoking status: Former Smoker    Packs/day: 1.50    Years: 15.00    Types: Cigarettes    Quit date: 03/04/1987  . Smokeless tobacco: Never Used    . Alcohol use No  . Drug use: No  . Sexual activity: Not Asked   Other Topics Concern  . 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    Former tobacco  No tad     Divorced.       Brother temp , son back in for temporarily.  People      Dog     Outpatient Medications Prior to Visit  Medication Sig Dispense Refill  . FLUoxetine (PROZAC) 10 MG capsule TAKE ONE CAPSULE BY MOUTH ONCE DAILY 90 capsule 0  . metFORMIN (GLUCOPHAGE-XR) 500 MG 24 hr tablet TAKE 2 TABLETS EVERY DAY WITH BREAKFAST 180 tablet 1  . metoprolol (LOPRESSOR) 50 MG tablet Take 0.5 tablets (25 mg total) by mouth 2 (two) times daily. 90 tablet 3  . triamterene-hydrochlorothiazide (MAXZIDE-25) 37.5-25 MG tablet Take 1 tablet by mouth daily. 90 tablet 3  . triamterene-hydrochlorothiazide (MAXZIDE-25) 37.5-25 MG tablet TAKE ONE TABLET BY MOUTH ONCE DAILY 90 tablet 0   No facility-administered medications prior to visit.      EXAM:  BP 124/76   Temp 97.9 F (36.6 C) (Oral)   Ht 5\' 3"  (1.6 m)  Wt 230 lb (104.3 kg)   BMI 40.74 kg/m   Body mass index is 40.74 kg/m.  GENERAL: vitals reviewed and listed above, alert, oriented, appears well hydrated and in no acute distress HEENT: atraumatic, conjunctiva  clear, no obvious abnormalities on inspection of external nose and ears OP : no lesion edema or exudate  NECK: no obvious masses on inspection palpation  LUNGS: clear to auscultation bilaterally, no wheezes, rales or rhonchi, good air movement CV: HRRR, no clubbing cyanosis or  peripheral edema nl cap refill  MS: moves all extremities without noticeable focal  Abnormality feet no v intact no swelling  PSYCH: pleasant and cooperative, no obvious depression or anxiety Lab Results  Component Value Date   WBC 5.7 01/15/2015   HGB 14.4 01/15/2015   HCT 43.2 01/15/2015   PLT 185.0 01/15/2015   GLUCOSE 147 (H) 01/15/2015    CHOL 200 01/15/2015   TRIG 168.0 (H) 01/15/2015   HDL 45.80 01/15/2015   LDLDIRECT 153.4 12/20/2012   LDLCALC 121 (H) 01/15/2015   ALT 25 01/15/2015   AST 23 01/15/2015   NA 142 01/15/2015   K 4.0 01/15/2015   CL 103 01/15/2015   CREATININE 0.77 01/15/2015   BUN 18 01/15/2015   CO2 28 01/15/2015   TSH 3.35 01/15/2015   HGBA1C 6.7 10/22/2015   MICROALBUR <0.7 01/22/2015  Medicare Wt Readings from Last 3 Encounters:  10/22/15 230 lb (104.3 kg)  01/22/15 227 lb 4.8 oz (103.1 kg)  12/01/14 233 lb (105.7 kg)    BP Readings from Last 3 Encounters:  10/22/15 124/76  01/22/15 128/80  12/01/14 (!) 154/86    ASSESSMENT AND PLAN:  Discussed the following assessment and plan:  type 2 diabetes mellitus ([place]) - controlled  take metformin 2 per day forgts siometimes - Plan: POCT A1C  Hyperglycemia - Plan: POCT A1C  Essential hypertension - controlled   Hyperlipidemia - prefers no med at this time will follow   Personal history of breast cancer  Medication management - refill prozac  doing well History trigger finger possible right hand arthritis but improved today. Will follow. She is still working full-time. Plan follow-up  Can do it as a checkup in January 2018 with labs then  -Patient advised to return or notify health care team  if symptoms worsen ,persist or new concerns arise.  Patient Instructions  Please  Try to take  The metformin  2 per day  Can take at any meal.    bp is better on repeat. Today .   CPX and labs in January 2018 .      Standley Brooking. Panosh M.D.

## 2015-10-22 NOTE — Progress Notes (Signed)
Pre visit review using our clinic review tool, if applicable. No additional management support is needed unless otherwise documented below in the visit note. 

## 2015-10-22 NOTE — Patient Instructions (Signed)
Please  Try to take  The metformin  2 per day  Can take at any meal.    bp is better on repeat. Today .   CPX and labs in January 2018 .

## 2015-10-23 ENCOUNTER — Telehealth: Payer: Self-pay | Admitting: Family Medicine

## 2015-10-23 MED ORDER — METFORMIN HCL ER 500 MG PO TB24: ORAL_TABLET | ORAL | 3 refills | Status: DC

## 2015-10-23 NOTE — Telephone Encounter (Signed)
Received a fax from [facility] in [place].  They received a prescription that did not state how may pill to take daily.  Sig stated, :take tablets every day."  Will resend in prescription with same amount of refills.

## 2015-10-24 DIAGNOSIS — M25511 Pain in right shoulder: Secondary | ICD-10-CM | POA: Diagnosis not present

## 2015-10-24 DIAGNOSIS — M7541 Impingement syndrome of right shoulder: Secondary | ICD-10-CM | POA: Diagnosis not present

## 2015-10-24 DIAGNOSIS — M6281 Muscle weakness (generalized): Secondary | ICD-10-CM | POA: Diagnosis not present

## 2015-10-29 ENCOUNTER — Ambulatory Visit: Payer: BLUE CROSS/BLUE SHIELD | Admitting: Internal Medicine

## 2015-11-07 DIAGNOSIS — M6281 Muscle weakness (generalized): Secondary | ICD-10-CM | POA: Diagnosis not present

## 2015-11-07 DIAGNOSIS — M25511 Pain in right shoulder: Secondary | ICD-10-CM | POA: Diagnosis not present

## 2015-11-07 DIAGNOSIS — M7541 Impingement syndrome of right shoulder: Secondary | ICD-10-CM | POA: Diagnosis not present

## 2015-11-12 DIAGNOSIS — M25511 Pain in right shoulder: Secondary | ICD-10-CM | POA: Diagnosis not present

## 2015-11-12 DIAGNOSIS — M7541 Impingement syndrome of right shoulder: Secondary | ICD-10-CM | POA: Diagnosis not present

## 2015-11-13 DIAGNOSIS — M75101 Unspecified rotator cuff tear or rupture of right shoulder, not specified as traumatic: Secondary | ICD-10-CM | POA: Diagnosis not present

## 2015-11-13 DIAGNOSIS — S46011A Strain of muscle(s) and tendon(s) of the rotator cuff of right shoulder, initial encounter: Secondary | ICD-10-CM | POA: Diagnosis not present

## 2015-11-13 DIAGNOSIS — M25511 Pain in right shoulder: Secondary | ICD-10-CM | POA: Diagnosis not present

## 2015-11-19 ENCOUNTER — Ambulatory Visit (INDEPENDENT_AMBULATORY_CARE_PROVIDER_SITE_OTHER): Payer: Medicare Other | Admitting: Internal Medicine

## 2015-11-19 ENCOUNTER — Encounter: Payer: Self-pay | Admitting: Internal Medicine

## 2015-11-19 VITALS — BP 138/70 | Temp 98.0°F | Ht 63.0 in | Wt 226.2 lb

## 2015-11-19 DIAGNOSIS — F40298 Other specified phobia: Secondary | ICD-10-CM

## 2015-11-19 DIAGNOSIS — F410 Panic disorder [episodic paroxysmal anxiety] without agoraphobia: Secondary | ICD-10-CM

## 2015-11-19 DIAGNOSIS — F40248 Other situational type phobia: Secondary | ICD-10-CM

## 2015-11-19 DIAGNOSIS — Z23 Encounter for immunization: Secondary | ICD-10-CM | POA: Diagnosis not present

## 2015-11-19 MED ORDER — ALPRAZOLAM 0.25 MG PO TABS: 0.2500 mg | ORAL_TABLET | Freq: Three times a day (TID) | ORAL | 0 refills | Status: DC | PRN

## 2015-11-19 MED ORDER — FLUOXETINE HCL 20 MG PO CAPS: 20.0000 mg | ORAL_CAPSULE | Freq: Every day | ORAL | 3 refills | Status: DC

## 2015-11-19 NOTE — Patient Instructions (Addendum)
  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 Increase the fluoxetine to 20 mg a day prescription given to you today. You can take alprazolam during the weeks or days related to travel. Is better to suppress the symptoms now before they escalate. If it is escalating despite this you would benefit from  specific  counseling therapy regarding this phobia. I think you have a good handle on what is happening. But one  cannot think away your emotions so professional  treating is appropriate.  ROV in  1-2 months or as needed.

## 2015-11-19 NOTE — Progress Notes (Signed)
Pre visit review using our clinic review tool, if applicable. No additional management support is needed unless otherwise documented below in the visit note.  Chief Complaint  Patient presents with  . Anxiety    HPI: [name] 65 y.o.  Comes in for   sda    ? Anxiety attack  She has a remote history of phobia of travel doesn't to airplane flights but at some time in the remote past had even difficulty getting in cars and going to work and traveling to [employer]. Since that time she's done pretty well on the fluoxetine remote history of Ativan and then Xanax to help but most recently when she had an opportunity to have someone drive her to [place] to visit FRIENDS very much wanted to go in the morning and couldn't get in the car and couldn't travel. She is very sad about this phobia that is come back. Asks about what to do. Isn't using alcohol but alcohol and alcoholism runs in her family brother and father mom had anxiety attacks as well as a brother. She did much better going through breast cancer treatment until recently. ROS: See pertinent positives and negatives per HPI.  Past Medical History:  Diagnosis Date  . 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 Anxiety   . Arthritis   . 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 Cancer [facility])    right breast cancer  . Cough due to ACE inhibitor 04/28/2011  . Depression   . Ganglion cyst   . GERD (gastroesophageal reflux disease)    occ  . HYPERTENSION 11/22/2007   echo card 2004 nl lv function   . NECK PAIN 11/22/2007  . Other specified disorder of skin 12/25/2008  . PONV (postoperative nausea and vomiting)   . TOBACCO USE, QUIT 12/25/2008    Family History  Problem Relation Age of Onset  . Alzheimer's disease Mother   . Hypertension Mother   . Anxiety disorder Mother   . Diabetes Father   . Alcoholism Father   . Diabetes Other     1st degree relative  . Bladder Cancer Brother     Social History     Social History  . Marital status: Divorced    Spouse name: N/A  . Number of children: N/A  . Years of education: N/A   Occupational History  . Hairdresser    Social History Main Topics  . Smoking status: Former Smoker    Packs/day: 1.50    Years: 15.00    Types: Cigarettes    Quit date: 03/04/1987  . Smokeless tobacco: Never Used  . Alcohol use No  . Drug use: No  . Sexual activity: Not Asked   Other Topics Concern  . 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    Former tobacco  No tad     Divorced.       Brother temp , son back in for temporarily.  People      Dog     Outpatient Medications Prior to Visit  Medication Sig Dispense Refill  . FLUoxetine (PROZAC) 10 MG capsule Take 1 capsule (10 mg total) by mouth daily. 90 capsule 3  . metFORMIN (GLUCOPHAGE-XR) 500 MG 24 hr tablet TAKE 2 TABLETS EVERY DAY 180 tablet 3  . metoprolol (LOPRESSOR) 50 MG tablet Take 0.5 tablets (  25 mg total) by mouth 2 (two) times daily. 90 tablet 3  . triamterene-hydrochlorothiazide (MAXZIDE-25) 37.5-25 MG tablet Take 1 tablet by mouth daily. 90 tablet 3   No facility-administered medications prior to visit.      EXAM:  BP 138/70 (BP Location: Right Arm, Patient Position: Sitting, Cuff Size: Large)   Temp 98 F (36.7 C) (Oral)   Ht 5\' 3"  (1.6 m)   Wt 226 lb 3.2 oz (102.6 kg)   BMI 40.07 kg/m   Body mass index is 40.07 kg/m.  GENERAL: vitals reviewed and listed above, alert, oriented, appears well hydrated and in no acute distress HEENT: atraumatic, conjunctiva  clear, no obvious abnormalities on inspection of external nose and ears OP : no lesion edema or exudate  PSYCH: pleasant and cooperative, no obvious depression or anxiety  ASSESSMENT AND PLAN:  Discussed the following assessment and plan:  Situational phobia  Anxiety attack  Need for prophylactic vaccination and inoculation  against influenza - Plan: Flu vaccine HIGH DOSE PF (Fluzone High dose) Anxiety attack consistent with phobia after discussion. It certainly is interfere with the recent lifestyle choice. Will increase fluoxetine to 20 mg and continue Xanax as needed or on days before she travels. She is at risk of escalation and is certainly willing to see a counselor. He seems to have good insight into her phobias. Risk-benefit of medicine. Is cautious about using alprazolam used to have it available but not needed. Total visit 7mins > 50% spent counseling and coordinating care as indicated in above note and in instructions to patient .  -Patient advised to return or notify health care team  if symptoms worsen ,persist or new concerns arise.  Patient Instructions   I agree this is an episode  related to travel  Other phobia. Increase the fluoxetine to 20 mg a day prescription given to you today. You can take alprazolam during the weeks or days related to travel. Is better to suppress the symptoms now before they escalate. If it is escalating despite this you would benefit from  specific  counseling therapy regarding this phobia. I think you have a good handle on what is happening. But one  cannot think away your emotions so professional  treating is appropriate.  ROV in  1-2 months or as needed.        Standley Brooking. Panosh M.D.

## 2015-12-04 ENCOUNTER — Ambulatory Visit (INDEPENDENT_AMBULATORY_CARE_PROVIDER_SITE_OTHER): Payer: Medicare Other | Admitting: Orthopaedic Surgery

## 2015-12-04 DIAGNOSIS — M25511 Pain in right shoulder: Secondary | ICD-10-CM | POA: Diagnosis not present

## 2015-12-17 DIAGNOSIS — M75111 Incomplete rotator cuff tear or rupture of right shoulder, not specified as traumatic: Secondary | ICD-10-CM | POA: Diagnosis not present

## 2015-12-17 DIAGNOSIS — M7551 Bursitis of right shoulder: Secondary | ICD-10-CM | POA: Diagnosis not present

## 2015-12-17 DIAGNOSIS — M25511 Pain in right shoulder: Secondary | ICD-10-CM | POA: Diagnosis not present

## 2015-12-17 DIAGNOSIS — M75121 Complete rotator cuff tear or rupture of right shoulder, not specified as traumatic: Secondary | ICD-10-CM | POA: Diagnosis not present

## 2015-12-26 ENCOUNTER — Ambulatory Visit (INDEPENDENT_AMBULATORY_CARE_PROVIDER_SITE_OTHER): Payer: Medicare Other | Admitting: Orthopaedic Surgery

## 2015-12-26 ENCOUNTER — Encounter (INDEPENDENT_AMBULATORY_CARE_PROVIDER_SITE_OTHER): Payer: Self-pay | Admitting: Orthopaedic Surgery

## 2015-12-26 VITALS — BP 142/79 | HR 64 | Ht 64.0 in | Wt 232.0 lb

## 2015-12-26 DIAGNOSIS — Z9889 Other specified postprocedural states: Secondary | ICD-10-CM

## 2015-12-26 DIAGNOSIS — M25511 Pain in right shoulder: Secondary | ICD-10-CM

## 2015-12-26 DIAGNOSIS — G8929 Other chronic pain: Secondary | ICD-10-CM

## 2015-12-26 NOTE — Progress Notes (Signed)
Office Visit Note   Patient: [name]           Date of Birth: January 02, 1951           MRN: [id number] Visit Date: 12/26/2015              Requested by: Burnis Medin, MD [place], [place] [address] PCP: Lottie Dawson, MD   Assessment & Plan: Visit Diagnoses:  1. Chronic right shoulder pain    Post rotator cuff repair right shoulder with biceps tenodesis  12/17/2015 Plan: Continue sling she'll gradually begin usage of her arm 6 weeks. We discussed certain circles undulant slings and then gradually starting to walk her fingers up the wall.  Follow-Up Instructions: Return in about 1 month (around 01/26/2016).   Orders:  No orders of the defined types were placed in this encounter.  No orders of the defined types were placed in this encounter.     Procedures: No procedures performed   Clinical Data: No additional findings.   Subjective: Chief Complaint  Patient presents with  . Right Shoulder - Routine Post Op    Patient is status post right shoulder arthroscopy with open rotator cuff tear and biceps tendonesis. She is 9 days post op. She states that she is doing well. Is not having any pain, just may be uncomfortable if she moves it a certain way.  She is wearing the sling all of the time. She is not taking any pain medication at this time. Incisions look good, no redness or swelling. Sutures removed and steris applied.    Review of Systems   Objective: Vital Signs: BP (!) 142/79 (BP Location: Left Arm)   Pulse 64   Ht 5\' 4"  (1.626 m)   Wt 232 lb (105.2 kg)   BMI 39.82 kg/m   Physical Exam  Constitutional: She is oriented to person, place, and time. She appears well-developed.  HENT:  Head: Normocephalic.  Right Ear: External ear normal.  Left Ear: External ear normal.  Eyes: Pupils are equal, round, and reactive to light.  Neck: No tracheal deviation present. No thyromegaly present.  Cardiovascular: Normal rate.     Pulmonary/Chest: Effort normal.  Abdominal: Soft.  Neurological: She is alert and oriented to person, place, and time.  Skin: Skin is warm and dry.  Psychiatric: She has a normal mood and affect. Her behavior is normal.    Ortho Exam incisions well-healed sutures are removed from the portals. She had subcuticular closure. New Steri-Strips applied.  Specialty Comments:  No specialty comments available.  Imaging: No results found.   PMFS History: Patient Active Problem List   Diagnosis Date Noted  . Right buttock pain 11/27/2014  . Hyperlipidemia 05/01/2014  . New onset type 2 diabetes mellitus ([place]) 05/01/2014  . Hyperglycemia 12/26/2013  . Personal history of breast cancer 12/01/2013  . Seroma, postoperative 05/05/2013  . Breast cancer ([place]) 03/10/2013  . Breast cancer, right breast ([place]) 02/01/2013  . Medication management 12/20/2012  . Numbness and tingling of both legs 12/14/2011  . H/O hyperglycemia 12/14/2011  . Chest pain, unspecified 08/18/2010  . Obesity 04/25/2010  . Varicose veins 04/25/2010  . Preventative health care 04/22/2010  . OTHER SPECIFIED DISORDER OF SKIN 12/25/2008  . TOBACCO USE, QUIT 12/25/2008  . ADJ DISORDER WITH MIXED ANXIETY & DEPRESSED MOOD 11/22/2007  . Essential hypertension 11/22/2007  . NECK PAIN 11/22/2007  . 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 GANGLION NOS 11/30/2006  . ASTHMA 11/25/2006  Past Medical History:  Diagnosis Date  . 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 Anxiety   . Arthritis   . 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 Cancer [facility])    right breast cancer  . Cough due to ACE inhibitor 04/28/2011  . Depression   . Ganglion cyst   . GERD (gastroesophageal reflux disease)    occ  . HYPERTENSION 11/22/2007   echo card 2004 nl lv function   . NECK PAIN 11/22/2007  . Other specified disorder of skin 12/25/2008  . PONV (postoperative nausea and vomiting)   . TOBACCO USE, QUIT 12/25/2008    Family  History  Problem Relation Age of Onset  . Alzheimer's disease Mother   . Hypertension Mother   . Anxiety disorder Mother   . Diabetes Father   . Alcoholism Father   . Diabetes Other     1st degree relative  . Bladder Cancer Brother     Past Surgical History:  Procedure Laterality Date  . ABDOMINAL HYSTERECTOMY  1989  . APPENDECTOMY    . BREAST RECONSTRUCTION Left 12/01/2013   dr Harlow Mares  . BREAST RECONSTRUCTION Left 12/01/2013   Procedure:  BREAST RECONSTRUCTION;  Surgeon: Crissie Reese, MD;  Location: [place];  Service: Plastics;  Laterality: Left;  . BREAST RECONSTRUCTION WITH PLACEMENT OF TISSUE EXPANDER AND FLEX HD (ACELLULAR HYDRATED DERMIS) Bilateral 03/10/2013   Procedure: PLACEMENT OF BILATERAL TISSUE EXPANDER WITH POSSIBLE FLEX HD (ACELLULAR HYDRATED DERMIS) FOR BREAST RECONSTRUCTION;  Surgeon: Crissie Reese, MD;  Location: [place];  Service: Plastics;  Laterality: Bilateral;  . CHOLECYSTECTOMY  2/15  . COLONOSCOPY    . LAPAROSCOPIC APPENDECTOMY    . MASTECTOMY COMPLETE / SIMPLE W/ SENTINEL NODE BIOPSY Bilateral   . MASTECTOMY W/ SENTINEL NODE BIOPSY Bilateral 03/10/2013   Procedure: BILATERAL MASTECTOMY WITH RIGHT SENTINEL LYMPH NODE BIOPSY FOLLOWED BY RECONSTRUCTION;  Surgeon: Merrie Roof, MD;  Location: [place];  Service: General;  Laterality: Bilateral;  . PORT-A-CATH REMOVAL Left 12/01/2014   Procedure: REMOVAL PORT-A-CATH;  Surgeon: Autumn Messing III, MD;  Location: [place];  Service: General;  Laterality: Left;  . PORTACATH PLACEMENT Left 03/10/2013   Procedure: INSERTION PORT-A-CATH;  Surgeon: Merrie Roof, MD;  Location: [place];  Service: General;  Laterality: Left;  . REMOVAL OF TISSUE EXPANDER AND PLACEMENT OF IMPLANT Left 05/05/2013   Procedure: REMOVAL OF LEFT BREAST TISSUE EXPANDER / incision and drainage of ceroma;  Surgeon: Crissie Reese, MD;  Location: [place];  Service: Plastics;  Laterality: Left;  . SHOULDER SURGERY     right shoulder scope, open RCR,  biceps tendonesis 12/17/15  . TISSUE EXPANDER PLACEMENT Left 12/01/2013   Procedure: TISSUE EXPANDER FOR DELAYED LEFT BREAST RECONSTRUCTION;  Surgeon: Crissie Reese, MD;  Location: [place];  Service: Plastics;  Laterality: Left;  . WRIST SURGERY  2001   Social History   Occupational History  . Hairdresser    Social History Main Topics  . Smoking status: Former Smoker    Packs/day: 1.50    Years: 15.00    Types: Cigarettes    Quit date: 03/04/1987  . Smokeless tobacco: Never Used  . Alcohol use No  . Drug use: No  . Sexual activity: Not on file

## 2015-12-28 ENCOUNTER — Telehealth (INDEPENDENT_AMBULATORY_CARE_PROVIDER_SITE_OTHER): Payer: Self-pay | Admitting: Orthopaedic Surgery

## 2015-12-28 NOTE — Telephone Encounter (Signed)
Patient wants to know when she can start driving.  Contact Info: [phone]

## 2015-12-28 NOTE — Telephone Encounter (Signed)
Please advise 

## 2015-12-31 NOTE — Telephone Encounter (Signed)
When off pain meds ucall thanks

## 2015-12-31 NOTE — Telephone Encounter (Signed)
I called patient and advised. 

## 2016-01-21 ENCOUNTER — Ambulatory Visit: Payer: Medicare Other | Admitting: Internal Medicine

## 2016-01-23 ENCOUNTER — Telehealth (INDEPENDENT_AMBULATORY_CARE_PROVIDER_SITE_OTHER): Payer: Self-pay | Admitting: *Deleted

## 2016-01-23 ENCOUNTER — Ambulatory Visit (INDEPENDENT_AMBULATORY_CARE_PROVIDER_SITE_OTHER): Payer: Medicare Other | Admitting: Orthopaedic Surgery

## 2016-01-23 ENCOUNTER — Encounter (INDEPENDENT_AMBULATORY_CARE_PROVIDER_SITE_OTHER): Payer: Self-pay

## 2016-01-23 NOTE — Telephone Encounter (Signed)
Noted. Will leave patient on schedule as planned.

## 2016-01-23 NOTE — Telephone Encounter (Signed)
Pt had a appt at 10:15 but had to leave. Pt stated Dr Lorin Mercy said she was able to go back to work after 6 weeks and she feels like she is able to do so, I rescheduled her appt for dec 13th for first availability. Pt requesting call back if this is too long to wait.

## 2016-01-23 NOTE — Telephone Encounter (Signed)
Yes OK to get back to work. Thanks she can ROV dec 13

## 2016-01-23 NOTE — Telephone Encounter (Signed)
Please advise. Patient was here for appt today and waited, but had to get to work.  OK to wait for first available appt?

## 2016-02-13 ENCOUNTER — Ambulatory Visit (INDEPENDENT_AMBULATORY_CARE_PROVIDER_SITE_OTHER): Payer: Medicare Other | Admitting: Orthopaedic Surgery

## 2016-02-13 ENCOUNTER — Encounter (INDEPENDENT_AMBULATORY_CARE_PROVIDER_SITE_OTHER): Payer: Self-pay | Admitting: Orthopaedic Surgery

## 2016-02-13 VITALS — BP 155/83 | HR 62 | Ht 64.0 in | Wt 230.0 lb

## 2016-02-13 DIAGNOSIS — G8929 Other chronic pain: Secondary | ICD-10-CM

## 2016-02-13 DIAGNOSIS — M25511 Pain in right shoulder: Secondary | ICD-10-CM

## 2016-02-13 DIAGNOSIS — Z9889 Other specified postprocedural states: Secondary | ICD-10-CM

## 2016-02-13 NOTE — Progress Notes (Signed)
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 She is happy with the surgical result.  Chief Complaint:  [name] is 2 months out from right shoulder surgery. She states that it seems to be doing fine, but she does have soreness in it when she moves certain ways.   Visit Diagnoses: No diagnosis found.  Plan: Patient back to full work activity we can check her back again on a when necessary basis she's notices some popping in her shoulder likely from rotator cuff repair as it rubs against the acromion. She continues to strengthen her shoulder with use this should resolve.  Follow-Up Instructions: No Follow-up on file.   Orders:  No orders of the defined types were placed in this encounter.  No orders of the defined types were placed in this encounter.    PMFS History: Patient Active Problem List   Diagnosis Date Noted  . Chronic right shoulder pain 12/26/2015  . Status post right rotator cuff repair 12/26/2015  . Right buttock pain 11/27/2014  . Hyperlipidemia 05/01/2014  . New onset type 2 diabetes mellitus ([place]) 05/01/2014  . Hyperglycemia 12/26/2013  . Personal history of breast cancer 12/01/2013  . Seroma, postoperative 05/05/2013  . Breast cancer ([place]) 03/10/2013  . Breast cancer, right breast ([place]) 02/01/2013  . Medication management 12/20/2012  . Numbness and tingling of both legs 12/14/2011  . H/O hyperglycemia 12/14/2011  . Chest pain, unspecified 08/18/2010  . Obesity 04/25/2010  . Varicose veins 04/25/2010  . Preventative health care 04/22/2010  . OTHER SPECIFIED DISORDER OF SKIN 12/25/2008  . TOBACCO USE, QUIT 12/25/2008  . ADJ DISORDER WITH MIXED ANXIETY & DEPRESSED MOOD  11/22/2007  . Essential hypertension 11/22/2007  . NECK PAIN 11/22/2007  . 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 GANGLION NOS 11/30/2006  . ASTHMA 11/25/2006   Past Medical History:  Diagnosis Date  . 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 Anxiety   . Arthritis   . 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 Cancer [facility])    right breast cancer  . Cough due to ACE inhibitor 04/28/2011  . Depression   . Ganglion cyst   . GERD (gastroesophageal reflux disease)    occ  . HYPERTENSION 11/22/2007   echo card 2004 nl lv function   . NECK PAIN 11/22/2007  . Other specified disorder of skin 12/25/2008  . PONV (postoperative nausea and vomiting)   . TOBACCO USE, QUIT 12/25/2008    Family History  Problem Relation Age of Onset  . Alzheimer's disease Mother   . Hypertension Mother   . Anxiety disorder Mother   . Diabetes Father   . Alcoholism Father   . Diabetes Other     1st degree relative  . Bladder Cancer Brother     Past Surgical History:  Procedure Laterality Date  . ABDOMINAL HYSTERECTOMY  1989  . APPENDECTOMY    . BREAST RECONSTRUCTION Left 12/01/2013   dr [name]  . BREAST RECONSTRUCTION Left 12/01/2013   Procedure:  BREAST RECONSTRUCTION;  Surgeon: Heidi Reese, MD;  Location: [place];  Service: Plastics;  Laterality: Left;  .  BREAST RECONSTRUCTION WITH PLACEMENT OF TISSUE EXPANDER AND FLEX HD (ACELLULAR HYDRATED DERMIS) Bilateral 03/10/2013   Procedure: PLACEMENT OF BILATERAL TISSUE EXPANDER WITH POSSIBLE FLEX HD (ACELLULAR HYDRATED DERMIS) FOR BREAST RECONSTRUCTION;  Surgeon: Heidi Reese, MD;  Location: [facility];  Service: Plastics;  Laterality: Bilateral;  . CHOLECYSTECTOMY  2/15  . COLONOSCOPY    . LAPAROSCOPIC APPENDECTOMY    . MASTECTOMY COMPLETE / SIMPLE W/ SENTINEL NODE BIOPSY Bilateral   . MASTECTOMY W/ SENTINEL NODE BIOPSY Bilateral 03/10/2013   Procedure: BILATERAL MASTECTOMY WITH RIGHT SENTINEL LYMPH NODE BIOPSY FOLLOWED BY RECONSTRUCTION;  Surgeon:  Heidi Roof, MD;  Location: [place];  Service: General;  Laterality: Bilateral;  . PORT-A-CATH REMOVAL Left 12/01/2014   Procedure: REMOVAL PORT-A-CATH;  Surgeon: Heidi Messing III, MD;  Location: [place];  Service: General;  Laterality: Left;  . PORTACATH PLACEMENT Left 03/10/2013   Procedure: INSERTION PORT-A-CATH;  Surgeon: Heidi Roof, MD;  Location: [place];  Service: General;  Laterality: Left;  . REMOVAL OF TISSUE EXPANDER AND PLACEMENT OF IMPLANT Left 05/05/2013   Procedure: REMOVAL OF LEFT BREAST TISSUE EXPANDER / incision and drainage of ceroma;  Surgeon: Heidi Reese, MD;  Location: [place];  Service: Plastics;  Laterality: Left;  . SHOULDER SURGERY     right shoulder scope, open RCR, biceps tendonesis 12/17/15  . TISSUE EXPANDER PLACEMENT Left 12/01/2013   Procedure: TISSUE EXPANDER FOR DELAYED LEFT BREAST RECONSTRUCTION;  Surgeon: Heidi Reese, MD;  Location: [place];  Service: Plastics;  Laterality: Left;  . WRIST SURGERY  2001   Social History   Occupational History  . Hairdresser    Social History Main Topics  . Smoking status: Former Smoker    Packs/day: 1.50    Years: 15.00    Types: Cigarettes    Quit date: 03/04/1987  . Smokeless tobacco: Never Used  . Alcohol use No  . Drug use: No  . Sexual activity: Not on file

## 2016-02-17 DIAGNOSIS — L509 Urticaria, unspecified: Secondary | ICD-10-CM | POA: Diagnosis not present

## 2016-03-24 DIAGNOSIS — Z9221 Personal history of antineoplastic chemotherapy: Secondary | ICD-10-CM | POA: Diagnosis not present

## 2016-03-24 DIAGNOSIS — Z171 Estrogen receptor negative status [ER-]: Secondary | ICD-10-CM | POA: Diagnosis not present

## 2016-03-24 DIAGNOSIS — Z853 Personal history of malignant neoplasm of breast: Secondary | ICD-10-CM | POA: Diagnosis not present

## 2016-03-30 NOTE — Progress Notes (Signed)
Chief Complaint  Patient presents with  . Annual Exam    HPI: [name] 66 y.o. comes in today for Preventive Medicare wellness visit .Since last visit. She has done fairly well. No new diagnoses. Rest cancer under treatment being seen every 6 months now. Blood pressures been controlled. Metformin taking without difficulty. No numbness weakness vision changes. Blood pressure medicine seems to be doing well. Fluoxetine continuing. She probably had a DEXA scan with her oncologist but she doesn't remember when. Uses  an occasional anxiety rarely. alprazolam. Like a refill to just have when needed.   Health Maintenance  Topic Date Due  . Hepatitis C Screening  09-Apr-1950  . OPHTHALMOLOGY EXAM  10/16/1960  . HIV Screening  10/16/1965  . MAMMOGRAM  01/15/2015  . DEXA SCAN  10/17/2015  . PNA vac Low Risk Adult (1 of 2 - PCV13) 10/17/2015  . FOOT EXAM  01/22/2016  . URINE MICROALBUMIN  01/22/2016  . HEMOGLOBIN A1C  04/23/2016  . COLONOSCOPY  12/05/2018  . TETANUS/TDAP  12/21/2022  . INFLUENZA VACCINE  Completed  . ZOSTAVAX  Completed   Health Maintenance Review LIFESTYLE:  TADNo tobacco or alcohol sugar drinks sleeps about 8 hours working 30 hours a week household of one pet dog     Hearing: No concerns  Vision:  No limitations at present . Last eye check UTD  Safety:  Has smoke detector and wears seat belts.  No firearms. No excess sun exposure. Sees dentist regularly.  Falls: No  Advance directive :  Reviewed    Memory: Felt to be good  , no concern from her or her family.  Depression: No anhedonia unusual crying or depressive symptoms  Nutrition: Eats well balanced diet; adequate calcium and vitamin D. No swallowing chewing problems.  Injury: no major injuries in the last six months.  Other healthcare providers:  Reviewed today .  Social: See above hairdresser works Programmer, systems.  Preventive parameters: up-to-date  Reviewed   ADLS:   There are no  problems or need for assistance  driving, feeding, obtaining food, dressing, toileting and bathing, managing money using phone. She is independent.    ROS:  No falling  Cp sob  GEN/ HEENT: No fever, significant weight changes sweats headaches vision problems hearing changes, CV/ PULM; No chest pain shortness of breath cough, syncope,edema  change in exercise tolerance. GI /GU: No adominal pain, vomiting, change in bowel habits. No blood in the stool. No significant GU symptoms. SKIN/HEME: ,no acute skin rashes suspicious lesions or bleeding. No lymphadenopathy, nodules, masses.  NEURO/ PSYCH:  No neurologic signs such as weakness numbness. No depression anxiety. IMM/ Allergy: No unusual infections.  Allergy .   REST of 12 system review negative except as per HPI   Past Medical History:  Diagnosis Date  . 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 Anxiety   . Arthritis   . 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 Cancer [facility])    right breast cancer  . Cough due to ACE inhibitor 04/28/2011  . Depression   . Ganglion cyst   . GERD (gastroesophageal reflux disease)    occ  . HYPERTENSION 11/22/2007   echo card 2004 nl lv function   . NECK PAIN 11/22/2007  . Other specified disorder of skin 12/25/2008  . PONV (postoperative nausea and vomiting)   . TOBACCO USE, QUIT 12/25/2008    Family History  Problem Relation Age of Onset  . Alzheimer's disease Mother   .  Hypertension Mother   . Anxiety disorder Mother   . Diabetes Father   . Alcoholism Father   . Diabetes Other     1st degree relative  . Bladder Cancer Brother     Social History   Social History  . Marital status: Divorced    Spouse name: N/A  . Number of children: N/A  . Years of education: N/A   Occupational History  . Hairdresser    Social History Main Topics  . Smoking status: Former Smoker    Packs/day: 1.50    Years: 15.00    Types: Cigarettes    Quit date: 03/04/1987  . Smokeless  tobacco: Never Used  . Alcohol use No  . Drug use: No  . Sexual activity: Not Asked   Other Topics Concern  . 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    Former tobacco  No tad     Divorced.       Brother temp , son back in for temporarily.  People      Dog     Outpatient Encounter Prescriptions as of 03/31/2016  Medication Sig  . ALPRAZolam (XANAX) 0.25 MG tablet Take 1 tablet (0.25 mg total) by mouth 3 (three) times daily as needed for anxiety.  Marland Kitchen FLUoxetine (PROZAC) 10 MG capsule Take 1 capsule (10 mg total) by mouth daily.  Marland Kitchen FLUoxetine (PROZAC) 20 MG capsule Take 1 capsule (20 mg total) by mouth daily.  . metFORMIN (GLUCOPHAGE-XR) 500 MG 24 hr tablet TAKE 2 TABLETS EVERY DAY  . metoprolol (LOPRESSOR) 50 MG tablet Take 0.5 tablets (25 mg total) by mouth 2 (two) times daily.  Marland Kitchen triamterene-hydrochlorothiazide (MAXZIDE-25) 37.5-25 MG tablet Take 1 tablet by mouth daily.  . [DISCONTINUED] ALPRAZolam (XANAX) 0.25 MG tablet Take 1 tablet (0.25 mg total) by mouth 3 (three) times daily as needed for anxiety.   No facility-administered encounter medications on file as of 03/31/2016.     EXAM:  BP 128/88   Pulse 72   Temp 97.8 F (36.6 C) (Oral)   Ht 5' 4"  (1.626 m)   Wt 236 lb (107 kg)   SpO2 96%   BMI 40.51 kg/m   Body mass index is 40.51 kg/m.  Physical Exam: Vital signs reviewed SXQ:KSKS is a well-developed well-nourished alert cooperative   who appears stated age in no acute distress.  HEENT: normocephalic atraumatic , Eyes: PERRL EOM's full, conjunctiva clear, Nares: paten,t no deformity discharge or tenderness., Ears: no deformity EAC's clear TMs with normal landmarks. Mouth: clear OP, no lesions, edema.  Moist mucous membranes. Dentition in adequate repair. NECK: supple without masses, thyromegaly or bruits. CHEST/PULM:  Clear to auscultation and percussion breath sounds  equal no wheeze , rales or rhonchi. No chest wall deformities or tenderness. Breast exam declined done at oncology  CV: PMI is nondisplaced, S1 S2 no gallops, murmurs, rubs. Peripheral pulses are full without delay.No JVD .  ABDOMEN: Bowel sounds normal nontender  No guard or rebound, no hepato splenomegal no CVA tenderness.   Extremtities:  No clubbing cyanosis or edema, no acute joint swelling or redness no focal atrophy NEURO:  Oriented x3, cranial nerves 3-12 appear to be intact, no obvious focal weakness,gait within normal limits no abnormal reflexes or asymmetrical SKIN: No acute rashes normal turgor, color, no bruising or petechiae. PSYCH: Oriented, good eye contact, no obvious  depression anxiety, cognition and judgment appear normal. LN: no cervical axillary inguinal adenopathy No noted deficits in memory, attention, and speech. Diabetic Foot Exam - Simple   Simple Foot Form Diabetic Foot exam was performed with the following findings:  Yes 03/31/2016 10:45 AM  Visual Inspection No deformities, no ulcerations, no other skin breakdown bilaterally:  Yes Sensation Testing Intact to touch and monofilament testing bilaterally:  Yes Pulse Check Posterior Tibialis and Dorsalis pulse intact bilaterally:  Yes Comments     BP Readings from Last 3 Encounters:  03/31/16 128/88  02/13/16 (!) 155/83  12/26/15 (!) 142/79   Wt Readings from Last 3 Encounters:  03/31/16 236 lb (107 kg)  02/13/16 230 lb (104.3 kg)  12/26/15 232 lb (105.2 kg)     ASSESSMENT AND PLAN:  Discussed the following assessment and plan:  Welcome to Medicare preventive visit  Personal history of breast cancer  New onset type 2 diabetes mellitus ([place]) - Plan: Basic metabolic panel, CBC with Differential/Platelet, Hemoglobin A1c, Hepatic function panel, Lipid panel, TSH, Microalbumin / creatinine urine ratio  Medication management - Plan: Basic metabolic panel, CBC with Differential/Platelet, Hemoglobin A1c,  Hepatic function panel, Lipid panel, TSH, Microalbumin / creatinine urine ratio  Essential hypertension - Plan: Basic metabolic panel, CBC with Differential/Platelet, Hemoglobin A1c, Hepatic function panel, Lipid panel, TSH, Microalbumin / creatinine urine ratio  Hyperlipidemia, unspecified hyperlipidemia type - Plan: Basic metabolic panel, CBC with Differential/Platelet, Hemoglobin A1c, Hepatic function panel, Lipid panel, TSH, Microalbumin / creatinine urine ratio  Situational phobia  Problem related to lifestyle age screen for hep c  - Plan: Hepatitis C antibody  Need for pneumococcal vaccination Laboratory studies today to follow metabolic condition and disease management problems If all okay recheck in 6 months. Routine hepatitis C screening. When labs return were reviewed for patient and make plan. Consider statin medicine if appropriate. Prevnar 13 today. Will get updated Pneumovax in the future. Okay to refill the alprazolam one time for cautious use. She appears to be doing quite well. Otherwise  Patient Care Team: Burnis Medin, MD as PCP - General Delila Pereyra, MD (Obstetrics and Gynecology) Derwood Kaplan, MD as Consulting Physician (Oncology)  Patient Instructions  Continue lifestyle intervention healthy eating and exercise .  caution with alprazolam. Will notify you  of labs when available. consideration of adding statin medication be cause of  Diabetes dx  albeit controlled .   consider getting bone density scan if not done by your oncologist   Health Maintenance, Female Introduction Adopting a healthy lifestyle and getting preventive care can go a long way to promote health and wellness. Talk with your health care provider about what schedule of regular examinations is right for you. This is a good chance for you to check in with your provider about disease prevention and staying healthy. In between checkups, there are plenty of things you can do on your own.  Experts have done a lot of research about which lifestyle changes and preventive measures are most likely to keep you healthy. Ask your health care provider for more information. Weight and diet Eat a healthy diet  Be sure to include plenty of vegetables, fruits, low-fat dairy products, and lean protein.  Do not eat a lot of foods high in solid fats, added sugars, or salt.  Get regular exercise. This is one of the most important things you can do for your health.  Most adults should exercise for at least 150 minutes each week. The exercise should  increase your heart rate and make you sweat (moderate-intensity exercise).  Most adults should also do strengthening exercises at least twice a week. This is in addition to the moderate-intensity exercise. Maintain a healthy weight  Body mass index (BMI) is a measurement that can be used to identify possible weight problems. It estimates body fat based on height and weight. Your health care provider can help determine your BMI and help you achieve or maintain a healthy weight.  For females 67 years of age and older:  A BMI below 18.5 is considered underweight.  A BMI of 18.5 to 24.9 is normal.  A BMI of 25 to 29.9 is considered overweight.  A BMI of 30 and above is considered obese. Watch levels of cholesterol and blood lipids  You should start having your blood tested for lipids and cholesterol at 66 years of age, then have this test every 5 years.  You may need to have your cholesterol levels checked more often if:  Your lipid or cholesterol levels are high.  You are older than 66 years of age.  You are at high risk for heart disease. Cancer screening Lung Cancer  Lung cancer screening is recommended for adults 107-24 years old who are at high risk for lung cancer because of a history of smoking.  A yearly low-dose CT scan of the lungs is recommended for people who:  Currently smoke.  Have quit within the past 15 years.  Have  at least a 30-pack-year history of smoking. A pack year is smoking an average of one pack of cigarettes a day for 1 year.  Yearly screening should continue until it has been 15 years since you quit.  Yearly screening should stop if you develop a health problem that would prevent you from having lung cancer treatment. Breast Cancer  Practice breast self-awareness. This means understanding how your breasts normally appear and feel.  It also means doing regular breast self-exams. Let your health care provider know about any changes, no matter how small.  If you are in your 20s or 30s, you should have a clinical breast exam (CBE) by a health care provider every 1-3 years as part of a regular health exam.  If you are 93 or older, have a CBE every year. Also consider having a breast X-ray (mammogram) every year.  If you have a family history of breast cancer, talk to your health care provider about genetic screening.  If you are at high risk for breast cancer, talk to your health care provider about having an MRI and a mammogram every year.  Breast cancer gene (BRCA) assessment is recommended for women who have family members with BRCA-related cancers. BRCA-related cancers include:  Breast.  Ovarian.  Tubal.  Peritoneal cancers.  Results of the assessment will determine the need for genetic counseling and BRCA1 and BRCA2 testing. Cervical Cancer  Your health care provider may recommend that you be screened regularly for cancer of the pelvic organs (ovaries, uterus, and vagina). This screening involves a pelvic examination, including checking for microscopic changes to the surface of your cervix (Pap test). You may be encouraged to have this screening done every 3 years, beginning at age 52.  For women ages 37-65, health care providers may recommend pelvic exams and Pap testing every 3 years, or they may recommend the Pap and pelvic exam, combined with testing for human papilloma virus  (HPV), every 5 years. Some types of HPV increase your risk of cervical cancer. Testing for  HPV may also be done on women of any age with unclear Pap test results.  Other health care providers may not recommend any screening for nonpregnant women who are considered low risk for pelvic cancer and who do not have symptoms. Ask your health care provider if a screening pelvic exam is right for you.  If you have had past treatment for cervical cancer or a condition that could lead to cancer, you need Pap tests and screening for cancer for at least 20 years after your treatment. If Pap tests have been discontinued, your risk factors (such as having a new sexual partner) need to be reassessed to determine if screening should resume. Some women have medical problems that increase the chance of getting cervical cancer. In these cases, your health care provider may recommend more frequent screening and Pap tests. Colorectal Cancer  This type of cancer can be detected and often prevented.  Routine colorectal cancer screening usually begins at 66 years of age and continues through 66 years of age.  Your health care provider may recommend screening at an earlier age if you have risk factors for colon cancer.  Your health care provider may also recommend using home test kits to check for hidden blood in the stool.  A small camera at the end of a tube can be used to examine your colon directly (sigmoidoscopy or colonoscopy). This is done to check for the earliest forms of colorectal cancer.  Routine screening usually begins at age 68.  Direct examination of the colon should be repeated every 5-10 years through 66 years of age. However, you may need to be screened more often if early forms of precancerous polyps or small growths are found. Skin Cancer  Check your skin from head to toe regularly.  Tell your health care provider about any new moles or changes in moles, especially if there is a change in a  mole's shape or color.  Also tell your health care provider if you have a mole that is larger than the size of a pencil eraser.  Always use sunscreen. Apply sunscreen liberally and repeatedly throughout the day.  Protect yourself by wearing long sleeves, pants, a wide-brimmed hat, and sunglasses whenever you are outside. Heart disease, diabetes, and high blood pressure  High blood pressure causes heart disease and increases the risk of stroke. High blood pressure is more likely to develop in:  People who have blood pressure in the high end of the normal range (130-139/85-89 mm Hg).  People who are overweight or obese.  People who are African American.  If you are 3-30 years of age, have your blood pressure checked every 3-5 years. If you are 66 years of age or older, have your blood pressure checked every year. You should have your blood pressure measured twice-once when you are at a hospital or clinic, and once when you are not at a hospital or clinic. Record the average of the two measurements. To check your blood pressure when you are not at a hospital or clinic, you can use:  An automated blood pressure machine at a pharmacy.  A home blood pressure monitor.  If you are between 25 years and 70 years old, ask your health care provider if you should take aspirin to prevent strokes.  Have regular diabetes screenings. This involves taking a blood sample to check your fasting blood sugar level.  If you are at a normal weight and have a low risk for diabetes, have this test  once every three years after 66 years of age.  If you are overweight and have a high risk for diabetes, consider being tested at a younger age or more often. Preventing infection Hepatitis B  If you have a higher risk for hepatitis B, you should be screened for this virus. You are considered at high risk for hepatitis B if:  You were born in a country where hepatitis B is common. Ask your health care provider  which countries are considered high risk.  Your parents were born in a high-risk country, and you have not been immunized against hepatitis B (hepatitis B vaccine).  You have HIV or AIDS.  You use needles to inject street drugs.  You live with someone who has hepatitis B.  You have had sex with someone who has hepatitis B.  You get hemodialysis treatment.  You take certain medicines for conditions, including cancer, organ transplantation, and autoimmune conditions. Hepatitis C  Blood testing is recommended for:  Everyone born from [birth year] through 1965.  Anyone with known risk factors for hepatitis C. Sexually transmitted infections (STIs)  You should be screened for sexually transmitted infections (STIs) including gonorrhea and chlamydia if:  You are sexually active and are younger than 66 years of age.  You are older than 66 years of age and your health care provider tells you that you are at risk for this type of infection.  Your sexual activity has changed since you were last screened and you are at an increased risk for chlamydia or gonorrhea. Ask your health care provider if you are at risk.  If you do not have HIV, but are at risk, it may be recommended that you take a prescription medicine daily to prevent HIV infection. This is called pre-exposure prophylaxis (PrEP). You are considered at risk if:  You are sexually active and do not regularly use condoms or know the HIV status of your partner(s).  You take drugs by injection.  You are sexually active with a partner who has HIV. Talk with your health care provider about whether you are at high risk of being infected with HIV. If you choose to begin PrEP, you should first be tested for HIV. You should then be tested every 3 months for as long as you are taking PrEP. Pregnancy  If you are premenopausal and you may become pregnant, ask your health care provider about preconception counseling.  If you may become pregnant,  take 400 to 800 micrograms (mcg) of folic acid every day.  If you want to prevent pregnancy, talk to your health care provider about birth control (contraception). Osteoporosis and menopause  Osteoporosis is a disease in which the bones lose minerals and strength with aging. This can result in serious bone fractures. Your risk for osteoporosis can be identified using a bone density scan.  If you are 34 years of age or older, or if you are at risk for osteoporosis and fractures, ask your health care provider if you should be screened.  Ask your health care provider whether you should take a calcium or vitamin D supplement to lower your risk for osteoporosis.  Menopause may have certain physical symptoms and risks.  Hormone replacement therapy may reduce some of these symptoms and risks. Talk to your health care provider about whether hormone replacement therapy is right for you. Follow these instructions at home:  Schedule regular health, dental, and eye exams.  Stay current with your immunizations.  Do not use any tobacco products  including cigarettes, chewing tobacco, or electronic cigarettes.  If you are pregnant, do not drink alcohol.  If you are breastfeeding, limit how much and how often you drink alcohol.  Limit alcohol intake to no more than 1 drink per day for nonpregnant women. One drink equals 12 ounces of beer, 5 ounces of wine, or 1 ounces of hard liquor.  Do not use street drugs.  Do not share needles.  Ask your health care provider for help if you need support or information about quitting drugs.  Tell your health care provider if you often feel depressed.  Tell your health care provider if you have ever been abused or do not feel safe at home. This information is not intended to replace advice given to you by your health care provider. Make sure you discuss any questions you have with your health care provider. Document Released: 09/02/2010 Document Revised:  07/26/2015 Document Reviewed: 11/21/2014  2017 Elsevier     Mariann Laster K. Panosh M.D.

## 2016-03-31 ENCOUNTER — Ambulatory Visit (INDEPENDENT_AMBULATORY_CARE_PROVIDER_SITE_OTHER): Payer: Medicare Other | Admitting: Internal Medicine

## 2016-03-31 ENCOUNTER — Encounter: Payer: Self-pay | Admitting: Internal Medicine

## 2016-03-31 VITALS — BP 128/88 | HR 72 | Temp 97.8°F | Ht 64.0 in | Wt 236.0 lb

## 2016-03-31 DIAGNOSIS — E785 Hyperlipidemia, unspecified: Secondary | ICD-10-CM | POA: Diagnosis not present

## 2016-03-31 DIAGNOSIS — Z79899 Other long term (current) drug therapy: Secondary | ICD-10-CM

## 2016-03-31 DIAGNOSIS — Z729 Problem related to lifestyle, unspecified: Secondary | ICD-10-CM

## 2016-03-31 DIAGNOSIS — Z23 Encounter for immunization: Secondary | ICD-10-CM

## 2016-03-31 DIAGNOSIS — E119 Type 2 diabetes mellitus without complications: Secondary | ICD-10-CM | POA: Diagnosis not present

## 2016-03-31 DIAGNOSIS — F40248 Other situational type phobia: Secondary | ICD-10-CM | POA: Diagnosis not present

## 2016-03-31 DIAGNOSIS — Z0000 Encounter for general adult medical examination without abnormal findings: Secondary | ICD-10-CM | POA: Diagnosis not present

## 2016-03-31 DIAGNOSIS — I10 Essential (primary) hypertension: Secondary | ICD-10-CM

## 2016-03-31 DIAGNOSIS — Z853 Personal history of malignant neoplasm of breast: Secondary | ICD-10-CM | POA: Diagnosis not present

## 2016-03-31 LAB — MICROALBUMIN / CREATININE URINE RATIO
CREATININE, U: 232 mg/dL
MICROALB/CREAT RATIO: 0.5 mg/g (ref 0.0–30.0)
Microalb, Ur: 1.1 mg/dL (ref 0.0–1.9)

## 2016-03-31 LAB — LIPID PANEL
CHOLESTEROL: 216 mg/dL — AB (ref 0–200)
HDL: 49.4 mg/dL (ref >39.00)
LDL Cholesterol: 133 mg/dL — ABNORMAL HIGH (ref 0–99)
NONHDL: 166.71
Total CHOL/HDL Ratio: 4
Triglycerides: 168 mg/dL — ABNORMAL HIGH (ref 0.0–149.0)
VLDL: 33.6 mg/dL (ref 0.0–40.0)

## 2016-03-31 LAB — BASIC METABOLIC PANEL
BUN: 18 mg/dL (ref 6–23)
CHLORIDE: 101 meq/L (ref 96–112)
CO2: 30 meq/L (ref 19–32)
CREATININE: 0.81 mg/dL (ref 0.40–1.20)
Calcium: 9.5 mg/dL (ref 8.4–10.5)
GFR: 75.32 mL/min (ref >60.00)
Glucose, Bld: 129 mg/dL — ABNORMAL HIGH (ref 70–99)
POTASSIUM: 3.7 meq/L (ref 3.5–5.1)
SODIUM: 139 meq/L (ref 135–145)

## 2016-03-31 LAB — CBC WITH DIFFERENTIAL/PLATELET
BASOS PCT: 0.4 % (ref 0.0–3.0)
Basophils Absolute: 0 10*3/uL (ref 0.0–0.1)
EOS ABS: 0.2 10*3/uL (ref 0.0–0.7)
EOS PCT: 3.1 % (ref 0.0–5.0)
HEMATOCRIT: 44.3 % (ref 36.0–46.0)
Hemoglobin: 15.1 g/dL — ABNORMAL HIGH (ref 12.0–15.0)
LYMPHS PCT: 27.1 % (ref 12.0–46.0)
Lymphs Abs: 1.4 10*3/uL (ref 0.7–4.0)
MCHC: 34 g/dL (ref 30.0–36.0)
MCV: 87.8 fl (ref 78.0–100.0)
MONOS PCT: 8.8 % (ref 3.0–12.0)
Monocytes Absolute: 0.4 10*3/uL (ref 0.1–1.0)
NEUTROS ABS: 3 10*3/uL (ref 1.4–7.7)
Neutrophils Relative %: 60.6 % (ref 43.0–77.0)
PLATELETS: 177 10*3/uL (ref 150.0–400.0)
RBC: 5.05 Mil/uL (ref 3.87–5.11)
RDW: 13.3 % (ref 11.5–15.5)
WBC: 5 10*3/uL (ref 4.0–10.5)

## 2016-03-31 LAB — TSH: TSH: 2.93 u[IU]/mL (ref 0.35–4.50)

## 2016-03-31 LAB — HEPATIC FUNCTION PANEL
ALBUMIN: 4.2 g/dL (ref 3.5–5.2)
ALK PHOS: 66 U/L (ref 39–117)
ALT: 27 U/L (ref 0–35)
AST: 25 U/L (ref 0–37)
Bilirubin, Direct: 0.1 mg/dL (ref 0.0–0.3)
TOTAL PROTEIN: 6.7 g/dL (ref 6.0–8.3)
Total Bilirubin: 0.9 mg/dL (ref 0.2–1.2)

## 2016-03-31 LAB — HEMOGLOBIN A1C: Hgb A1c MFr Bld: 6.9 % — ABNORMAL HIGH (ref 4.6–6.5)

## 2016-03-31 MED ORDER — ALPRAZOLAM 0.25 MG PO TABS: 0.2500 mg | ORAL_TABLET | Freq: Three times a day (TID) | ORAL | 0 refills | Status: DC | PRN

## 2016-03-31 NOTE — Patient Instructions (Signed)
Continue lifestyle intervention healthy eating and exercise .  caution with alprazolam. Will notify you  of labs when available. consideration of adding statin medication be cause of  Diabetes dx  albeit controlled .   consider getting bone density scan if not done by your oncologist   Health Maintenance, Female Introduction Adopting a healthy lifestyle and getting preventive care can go a long way to promote health and wellness. Talk with your health care provider about what schedule of regular examinations is right for you. This is a good chance for you to check in with your provider about disease prevention and staying healthy. In between checkups, there are plenty of things you can do on your own. Experts have done a lot of research about which lifestyle changes and preventive measures are most likely to keep you healthy. Ask your health care provider for more information. Weight and diet Eat a healthy diet  Be sure to include plenty of vegetables, fruits, low-fat dairy products, and lean protein.  Do not eat a lot of foods high in solid fats, added sugars, or salt.  Get regular exercise. This is one of the most important things you can do for your health.  Most adults should exercise for at least 150 minutes each week. The exercise should increase your heart rate and make you sweat (moderate-intensity exercise).  Most adults should also do strengthening exercises at least twice a week. This is in addition to the moderate-intensity exercise. Maintain a healthy weight  Body mass index (BMI) is a measurement that can be used to identify possible weight problems. It estimates body fat based on height and weight. Your health care provider can help determine your BMI and help you achieve or maintain a healthy weight.  For females 79 years of age and older:  A BMI below 18.5 is considered underweight.  A BMI of 18.5 to 24.9 is normal.  A BMI of 25 to 29.9 is considered  overweight.  A BMI of 30 and above is considered obese. Watch levels of cholesterol and blood lipids  You should start having your blood tested for lipids and cholesterol at 66 years of age, then have this test every 5 years.  You may need to have your cholesterol levels checked more often if:  Your lipid or cholesterol levels are high.  You are older than 66 years of age.  You are at high risk for heart disease. Cancer screening Lung Cancer  Lung cancer screening is recommended for adults 3-19 years old who are at high risk for lung cancer because of a history of smoking.  A yearly low-dose CT scan of the lungs is recommended for people who:  Currently smoke.  Have quit within the past 15 years.  Have at least a 30-pack-year history of smoking. A pack year is smoking an average of one pack of cigarettes a day for 1 year.  Yearly screening should continue until it has been 15 years since you quit.  Yearly screening should stop if you develop a health problem that would prevent you from having lung cancer treatment. Breast Cancer  Practice breast self-awareness. This means understanding how your breasts normally appear and feel.  It also means doing regular breast self-exams. Let your health care provider know about any changes, no matter how small.  If you are in your 20s or 30s, you should have a clinical breast exam (CBE) by a health care provider every 1-3 years as part of a regular health exam.  If you are 40 or older, have a CBE every year. Also consider having a breast X-ray (mammogram) every year.  If you have a family history of breast cancer, talk to your health care provider about genetic screening.  If you are at high risk for breast cancer, talk to your health care provider about having an MRI and a mammogram every year.  Breast cancer gene (BRCA) assessment is recommended for women who have family members with BRCA-related cancers. BRCA-related cancers  include:  Breast.  Ovarian.  Tubal.  Peritoneal cancers.  Results of the assessment will determine the need for genetic counseling and BRCA1 and BRCA2 testing. Cervical Cancer  Your health care provider may recommend that you be screened regularly for cancer of the pelvic organs (ovaries, uterus, and vagina). This screening involves a pelvic examination, including checking for microscopic changes to the surface of your cervix (Pap test). You may be encouraged to have this screening done every 3 years, beginning at age 20.  For women ages 9-65, health care providers may recommend pelvic exams and Pap testing every 3 years, or they may recommend the Pap and pelvic exam, combined with testing for human papilloma virus (HPV), every 5 years. Some types of HPV increase your risk of cervical cancer. Testing for HPV may also be done on women of any age with unclear Pap test results.  Other health care providers may not recommend any screening for nonpregnant women who are considered low risk for pelvic cancer and who do not have symptoms. Ask your health care provider if a screening pelvic exam is right for you.  If you have had past treatment for cervical cancer or a condition that could lead to cancer, you need Pap tests and screening for cancer for at least 20 years after your treatment. If Pap tests have been discontinued, your risk factors (such as having a new sexual partner) need to be reassessed to determine if screening should resume. Some women have medical problems that increase the chance of getting cervical cancer. In these cases, your health care provider may recommend more frequent screening and Pap tests. Colorectal Cancer  This type of cancer can be detected and often prevented.  Routine colorectal cancer screening usually begins at 66 years of age and continues through 66 years of age.  Your health care provider may recommend screening at an earlier age if you have risk factors  for colon cancer.  Your health care provider may also recommend using home test kits to check for hidden blood in the stool.  A small camera at the end of a tube can be used to examine your colon directly (sigmoidoscopy or colonoscopy). This is done to check for the earliest forms of colorectal cancer.  Routine screening usually begins at age 88.  Direct examination of the colon should be repeated every 5-10 years through 65 years of age. However, you may need to be screened more often if early forms of precancerous polyps or small growths are found. Skin Cancer  Check your skin from head to toe regularly.  Tell your health care provider about any new moles or changes in moles, especially if there is a change in a mole's shape or color.  Also tell your health care provider if you have a mole that is larger than the size of a pencil eraser.  Always use sunscreen. Apply sunscreen liberally and repeatedly throughout the day.  Protect yourself by wearing long sleeves, pants, a wide-brimmed hat, and sunglasses whenever  you are outside. Heart disease, diabetes, and high blood pressure  High blood pressure causes heart disease and increases the risk of stroke. High blood pressure is more likely to develop in:  People who have blood pressure in the high end of the normal range (130-139/85-89 mm Hg).  People who are overweight or obese.  People who are African American.  If you are 56-57 years of age, have your blood pressure checked every 3-5 years. If you are 70 years of age or older, have your blood pressure checked every year. You should have your blood pressure measured twice-once when you are at a hospital or clinic, and once when you are not at a hospital or clinic. Record the average of the two measurements. To check your blood pressure when you are not at a hospital or clinic, you can use:  An automated blood pressure machine at a pharmacy.  A home blood pressure monitor.  If you  are between 70 years and 27 years old, ask your health care provider if you should take aspirin to prevent strokes.  Have regular diabetes screenings. This involves taking a blood sample to check your fasting blood sugar level.  If you are at a normal weight and have a low risk for diabetes, have this test once every three years after 66 years of age.  If you are overweight and have a high risk for diabetes, consider being tested at a younger age or more often. Preventing infection Hepatitis B  If you have a higher risk for hepatitis B, you should be screened for this virus. You are considered at high risk for hepatitis B if:  You were born in a country where hepatitis B is common. Ask your health care provider which countries are considered high risk.  Your parents were born in a high-risk country, and you have not been immunized against hepatitis B (hepatitis B vaccine).  You have HIV or AIDS.  You use needles to inject street drugs.  You live with someone who has hepatitis B.  You have had sex with someone who has hepatitis B.  You get hemodialysis treatment.  You take certain medicines for conditions, including cancer, organ transplantation, and autoimmune conditions. Hepatitis C  Blood testing is recommended for:  Everyone born from [birth year] through 1965.  Anyone with known risk factors for hepatitis C. Sexually transmitted infections (STIs)  You should be screened for sexually transmitted infections (STIs) including gonorrhea and chlamydia if:  You are sexually active and are younger than 66 years of age.  You are older than 66 years of age and your health care provider tells you that you are at risk for this type of infection.  Your sexual activity has changed since you were last screened and you are at an increased risk for chlamydia or gonorrhea. Ask your health care provider if you are at risk.  If you do not have HIV, but are at risk, it may be recommended that you  take a prescription medicine daily to prevent HIV infection. This is called pre-exposure prophylaxis (PrEP). You are considered at risk if:  You are sexually active and do not regularly use condoms or know the HIV status of your partner(s).  You take drugs by injection.  You are sexually active with a partner who has HIV. Talk with your health care provider about whether you are at high risk of being infected with HIV. If you choose to begin PrEP, you should first be tested for HIV.  You should then be tested every 3 months for as long as you are taking PrEP. Pregnancy  If you are premenopausal and you may become pregnant, ask your health care provider about preconception counseling.  If you may become pregnant, take 400 to 800 micrograms (mcg) of folic acid every day.  If you want to prevent pregnancy, talk to your health care provider about birth control (contraception). Osteoporosis and menopause  Osteoporosis is a disease in which the bones lose minerals and strength with aging. This can result in serious bone fractures. Your risk for osteoporosis can be identified using a bone density scan.  If you are 2 years of age or older, or if you are at risk for osteoporosis and fractures, ask your health care provider if you should be screened.  Ask your health care provider whether you should take a calcium or vitamin D supplement to lower your risk for osteoporosis.  Menopause may have certain physical symptoms and risks.  Hormone replacement therapy may reduce some of these symptoms and risks. Talk to your health care provider about whether hormone replacement therapy is right for you. Follow these instructions at home:  Schedule regular health, dental, and eye exams.  Stay current with your immunizations.  Do not use any tobacco products including cigarettes, chewing tobacco, or electronic cigarettes.  If you are pregnant, do not drink alcohol.  If you are breastfeeding, limit  how much and how often you drink alcohol.  Limit alcohol intake to no more than 1 drink per day for nonpregnant women. One drink equals 12 ounces of beer, 5 ounces of wine, or 1 ounces of hard liquor.  Do not use street drugs.  Do not share needles.  Ask your health care provider for help if you need support or information about quitting drugs.  Tell your health care provider if you often feel depressed.  Tell your health care provider if you have ever been abused or do not feel safe at home. This information is not intended to replace advice given to you by your health care provider. Make sure you discuss any questions you have with your health care provider. Document Released: 09/02/2010 Document Revised: 07/26/2015 Document Reviewed: 11/21/2014  2017 [employer]

## 2016-04-01 LAB — HEPATITIS C ANTIBODY: HCV AB: NEGATIVE

## 2016-04-29 ENCOUNTER — Other Ambulatory Visit: Payer: Self-pay | Admitting: Internal Medicine

## 2016-04-29 NOTE — Telephone Encounter (Signed)
Refilled sent into the pharmacy for a 6 month supply

## 2016-05-26 DIAGNOSIS — Z7984 Long term (current) use of oral hypoglycemic drugs: Secondary | ICD-10-CM | POA: Diagnosis not present

## 2016-05-26 DIAGNOSIS — H43813 Vitreous degeneration, bilateral: Secondary | ICD-10-CM | POA: Diagnosis not present

## 2016-05-26 DIAGNOSIS — H25813 Combined forms of age-related cataract, bilateral: Secondary | ICD-10-CM | POA: Diagnosis not present

## 2016-05-26 DIAGNOSIS — H5203 Hypermetropia, bilateral: Secondary | ICD-10-CM | POA: Diagnosis not present

## 2016-05-26 DIAGNOSIS — H43313 Vitreous membranes and strands, bilateral: Secondary | ICD-10-CM | POA: Diagnosis not present

## 2016-05-26 DIAGNOSIS — H43393 Other vitreous opacities, bilateral: Secondary | ICD-10-CM | POA: Diagnosis not present

## 2016-05-26 DIAGNOSIS — H524 Presbyopia: Secondary | ICD-10-CM | POA: Diagnosis not present

## 2016-05-26 DIAGNOSIS — E119 Type 2 diabetes mellitus without complications: Secondary | ICD-10-CM | POA: Diagnosis not present

## 2016-05-26 DIAGNOSIS — I10 Essential (primary) hypertension: Secondary | ICD-10-CM | POA: Diagnosis not present

## 2016-05-26 DIAGNOSIS — H4303 Vitreous prolapse, bilateral: Secondary | ICD-10-CM | POA: Diagnosis not present

## 2016-05-26 LAB — HM DIABETES EYE EXAM

## 2016-05-28 ENCOUNTER — Encounter: Payer: Self-pay | Admitting: Family Medicine

## 2016-07-14 DIAGNOSIS — C50911 Malignant neoplasm of unspecified site of right female breast: Secondary | ICD-10-CM | POA: Diagnosis not present

## 2016-08-19 DIAGNOSIS — L820 Inflamed seborrheic keratosis: Secondary | ICD-10-CM | POA: Diagnosis not present

## 2016-09-29 ENCOUNTER — Ambulatory Visit: Payer: Medicare Other | Admitting: Internal Medicine

## 2016-11-05 ENCOUNTER — Other Ambulatory Visit: Payer: Self-pay | Admitting: Internal Medicine

## 2016-11-05 NOTE — Telephone Encounter (Signed)
I spoke with pt made her aware that she must keep her follow up appt. Pt understood and agreed. Rxs was sent in. Schedulers will be speaking with the pt to get her scheduled.

## 2016-11-05 NOTE — Telephone Encounter (Signed)
Please advise on refill  Last OV 03/31/16

## 2016-11-05 NOTE — Telephone Encounter (Signed)
Please have her  rescedule her  6 mos ROV that she had to cancel in July    Can refill each  Med x 6 months

## 2016-11-17 ENCOUNTER — Encounter: Payer: Self-pay | Admitting: Internal Medicine

## 2016-11-17 ENCOUNTER — Other Ambulatory Visit: Payer: Self-pay | Admitting: Internal Medicine

## 2016-11-17 ENCOUNTER — Ambulatory Visit (INDEPENDENT_AMBULATORY_CARE_PROVIDER_SITE_OTHER): Payer: Medicare Other | Admitting: Internal Medicine

## 2016-11-17 VITALS — BP 118/70 | HR 64 | Temp 97.6°F | Wt 236.5 lb

## 2016-11-17 DIAGNOSIS — Z79899 Other long term (current) drug therapy: Secondary | ICD-10-CM

## 2016-11-17 DIAGNOSIS — I10 Essential (primary) hypertension: Secondary | ICD-10-CM

## 2016-11-17 DIAGNOSIS — Z23 Encounter for immunization: Secondary | ICD-10-CM

## 2016-11-17 DIAGNOSIS — M542 Cervicalgia: Secondary | ICD-10-CM | POA: Diagnosis not present

## 2016-11-17 DIAGNOSIS — E119 Type 2 diabetes mellitus without complications: Secondary | ICD-10-CM | POA: Diagnosis not present

## 2016-11-17 DIAGNOSIS — E785 Hyperlipidemia, unspecified: Secondary | ICD-10-CM | POA: Diagnosis not present

## 2016-11-17 DIAGNOSIS — Z853 Personal history of malignant neoplasm of breast: Secondary | ICD-10-CM | POA: Diagnosis not present

## 2016-11-17 LAB — POCT GLYCOSYLATED HEMOGLOBIN (HGB A1C): HEMOGLOBIN A1C: 6.8

## 2016-11-17 NOTE — Patient Instructions (Addendum)
Your hemoglobin A1c was 6.8 today which was stable and acceptable. However reinstitute your attention to healthy eating and activity.  Still advise   Statin medication for cholesterol . As we discussed. If you can get your LDL down below 100 or close to 70 we can put off cholesterol medicine. Flu vaccine today. I think the head neck pain is related to your neck and not serious.Watch her posture prolong sitting let us know if more concerns.    Diabetes Mellitus and Standards of Medical Care Managing diabetes (diabetes mellitus) can be complicated. Your diabetes treatment may be managed by a team of health care providers, including:  A diet and nutrition specialist (registered dietitian).  A nurse.  A certified diabetes educator (CDE).  A diabetes specialist (endocrinologist).  An eye doctor.  A primary care provider.  A dentist.  Your health care providers follow a schedule in order to help you get the best quality of care. The following schedule is a general guideline for your diabetes management plan. Your health care providers may also give you more specific instructions. HbA1c ( hemoglobin A1c) test This test provides information about blood sugar (glucose) control over the previous 2-3 months. It is used to check whether your diabetes management plan needs to be adjusted.  If you are meeting your treatment goals, this test is done at least 2 times a year.  If you are not meeting treatment goals or if your treatment goals have changed, this test is done 4 times a year.  Blood pressure test  This test is done at every routine medical visit. For most people, the goal is less than 130/80. Ask your health care provider what your goal blood pressure should be. Dental and eye exams  Visit your dentist two times a year.  If you have type 1 diabetes, get an eye exam 3-5 years after you are diagnosed, and then once a year after your first exam. ? If you were diagnosed with type  1 diabetes as a child, get an eye exam when you are age 74 or older and have had diabetes for 3-5 years. After the first exam, you should get an eye exam once a year.  If you have type 2 diabetes, have an eye exam as soon as you are diagnosed, and then once a year after your first exam. Foot care exam  Visual foot exams are done at every routine medical visit. The exams check for cuts, bruises, redness, blisters, sores, or other problems with the feet.  A complete foot exam is done by your health care provider once a year. This exam includes an inspection of the structure and skin of your feet, and a check of the pulses and sensation in your feet. ? Type 1 diabetes: Get your first exam 3-5 years after diagnosis. ? Type 2 diabetes: Get your first exam as soon as you are diagnosed.  Check your feet every day for cuts, bruises, redness, blisters, or sores. If you have any of these or other problems that are not healing, contact your health care provider. Kidney function test ( urine microalbumin)  This test is done once a year. ? Type 1 diabetes: Get your first test 5 years after diagnosis. ? Type 2 diabetes: Get your first test as soon as you are diagnosed.  If you have chronic kidney disease (CKD), get a serum creatinine and estimated glomerular filtration rate (eGFR) test once a year. Lipid profile (cholesterol, HDL, LDL, triglycerides)  This test should  be done when you are diagnosed with diabetes, and every 5 years after the first test. If you are on medicines to lower your cholesterol, you may need to get this test done every year. ? The goal for LDL is less than 100 mg/dL (5.5 mmol/L). If you are at high risk, the goal is less than 70 mg/dL (3.9 mmol/L). ? The goal for HDL is 40 mg/dL (2.2 mmol/L) for men and 50 mg/dL(2.8 mmol/L) for women. An HDL cholesterol of 60 mg/dL (3.3 mmol/L) or higher gives some protection against heart disease. ? The goal for triglycerides is less than 150  mg/dL (8.3 mmol/L). Immunizations  The yearly flu (influenza) vaccine is recommended for everyone 6 months or older who has diabetes.  The pneumonia (pneumococcal) vaccine is recommended for everyone 2 years or older who has diabetes. If you are 57 or older, you may get the pneumonia vaccine as a series of two separate shots.  The hepatitis B vaccine is recommended for adults shortly after they have been diagnosed with diabetes.  The Tdap (tetanus, diphtheria, and pertussis) vaccine should be given: ? According to normal childhood vaccination schedules, for children. ? Every 10 years, for adults who have diabetes.  The shingles vaccine is recommended for people who have had chicken pox and are 50 years or older. Mental and emotional health  Screening for symptoms of eating disorders, anxiety, and depression is recommended at the time of diagnosis and afterward as needed. If your screening shows that you have symptoms (you have a positive screening result), you may need further evaluation and be referred to a mental health care provider. Diabetes self-management education  Education about how to manage your diabetes is recommended at diagnosis and ongoing as needed. Treatment plan  Your treatment plan will be reviewed at every medical visit. Summary  Managing diabetes (diabetes mellitus) can be complicated. Your diabetes treatment may be managed by a team of health care providers.  Your health care providers follow a schedule in order to help you get the best quality of care.  Standards of care including having regular physical exams, blood tests, blood pressure monitoring, immunizations, screening tests, and education about how to manage your diabetes.  Your health care providers may also give you more specific instructions based on your individual health. This information is not intended to replace advice given to you by your health care provider. Make sure you discuss any questions  you have with your health care provider. Document Released: 12/15/2008 Document Revised: 11/16/2015 Document Reviewed: 11/16/2015 Elsevier Interactive Patient Education  [employer].      Why follow it? Research shows. . Those who follow the Mediterranean diet have a reduced risk of heart disease  . The diet is associated with a reduced incidence of Parkinson's and Alzheimer's diseases . People following the diet may have longer life expectancies and lower rates of chronic diseases  . The Dietary Guidelines for Americans recommends the Mediterranean diet as an eating plan to promote health and prevent disease  What Is the Mediterranean Diet?  . Healthy eating plan based on typical foods and recipes of Mediterranean-style cooking . The diet is primarily a plant based diet; these foods should make up a majority of meals   Starches - Plant based foods should make up a majority of meals - They are an important sources of vitamins, minerals, energy, antioxidants, and fiber - Choose whole grains, foods high in fiber and minimally processed items  - Typical grain sources  include wheat, oats, barley, corn, brown rice, bulgar, farro, millet, polenta, couscous  - Various types of beans include chickpeas, lentils, fava beans, black beans, white beans   Fruits  Veggies - Large quantities of antioxidant rich fruits & veggies; 6 or more servings  - Vegetables can be eaten raw or lightly drizzled with oil and cooked  - Vegetables common to the traditional Mediterranean Diet include: artichokes, arugula, beets, broccoli, brussel sprouts, cabbage, carrots, celery, collard greens, cucumbers, eggplant, kale, leeks, lemons, lettuce, mushrooms, okra, onions, peas, peppers, potatoes, pumpkin, radishes, rutabaga, shallots, spinach, sweet potatoes, turnips, zucchini - Fruits common to the Mediterranean Diet include: apples, apricots, avocados, cherries, clementines, dates, figs, grapefruits, grapes, melons,  nectarines, oranges, peaches, pears, pomegranates, strawberries, tangerines  Fats - Replace butter and margarine with healthy oils, such as olive oil, canola oil, and tahini  - Limit nuts to no more than a handful a day  - Nuts include walnuts, almonds, pecans, pistachios, pine nuts  - Limit or avoid candied, honey roasted or heavily salted nuts - Olives are central to the [employer] - can be eaten whole or used in a variety of dishes   Meats Protein - Limiting red meat: no more than a few times a month - When eating red meat: choose lean cuts and keep the portion to the size of deck of cards - Eggs: approx. 0 to 4 times a week  - Fish and lean poultry: at least 2 a week  - Healthy protein sources include, chicken, [place], lean beef, lamb - Increase intake of seafood such as tuna, salmon, trout, mackerel, shrimp, scallops - Avoid or limit high fat processed meats such as sausage and bacon  Dairy - Include moderate amounts of low fat dairy products  - Focus on healthy dairy such as fat free yogurt, skim milk, low or reduced fat cheese - Limit dairy products higher in fat such as whole or 2% milk, cheese, ice cream  Alcohol - Moderate amounts of red wine is ok  - No more than 5 oz daily for women (all ages) and men older than age 69  - No more than 10 oz of wine daily for men younger than 33  Other - Limit sweets and other desserts  - Use herbs and spices instead of salt to flavor foods  - Herbs and spices common to the traditional Mediterranean Diet include: basil, bay leaves, chives, cloves, cumin, fennel, garlic, lavender, marjoram, mint, oregano, parsley, pepper, rosemary, sage, savory, sumac, tarragon, thyme   It's not just a diet, it's a lifestyle:  . The Mediterranean diet includes lifestyle factors typical of those in the region  . Foods, drinks and meals are best eaten with others and savored . Daily physical activity is important for overall good health . This could be  strenuous exercise like running and aerobics . This could also be more leisurely activities such as walking, housework, yard-work, or taking the stairs . Moderation is the key; a balanced and healthy diet accommodates most foods and drinks . Consider portion sizes and frequency of consumption of certain foods   Meal Ideas & Options:  . Breakfast:  o Whole wheat toast or whole wheat English muffins with peanut butter & hard boiled egg o Steel cut oats topped with apples & cinnamon and skim milk  o Fresh fruit: banana, strawberries, melon, berries, peaches  o Smoothies: strawberries, bananas, greek yogurt, peanut butter o Low fat greek yogurt with blueberries and granola  o Egg  white omelet with spinach and mushrooms o Breakfast couscous: whole wheat couscous, apricots, skim milk, cranberries  . Sandwiches:  o Hummus and grilled vegetables (peppers, zucchini, squash) on whole wheat bread   o Grilled chicken on whole wheat pita with lettuce, tomatoes, cucumbers or tzatziki  o Tuna salad on whole wheat bread: tuna salad made with greek yogurt, olives, red peppers, capers, green onions o Garlic rosemary lamb pita: lamb sauted with garlic, rosemary, salt & pepper; add lettuce, cucumber, greek yogurt to pita - flavor with lemon juice and black pepper  . Seafood:  o Mediterranean grilled salmon, seasoned with garlic, basil, parsley, lemon juice and black pepper o Shrimp, lemon, and spinach whole-grain pasta salad made with low fat greek yogurt  o Seared scallops with lemon orzo  o Seared tuna steaks seasoned salt, pepper, coriander topped with tomato mixture of olives, tomatoes, olive oil, minced garlic, parsley, green onions and cappers  . Meats:  o Herbed greek chicken salad with kalamata olives, cucumber, feta  o Red bell peppers stuffed with spinach, bulgur, lean ground beef (or lentils) & topped with feta   o Kebabs: skewers of chicken, tomatoes, onions, zucchini, squash  o [place] burgers:  made with red onions, mint, dill, lemon juice, feta cheese topped with roasted red peppers . Vegetarian o Cucumber salad: cucumbers, artichoke hearts, celery, red onion, feta cheese, tossed in olive oil & lemon juice  o Hummus and whole grain pita points with a greek salad (lettuce, tomato, feta, olives, cucumbers, red onion) o Lentil soup with celery, carrots made with vegetable broth, garlic, salt and pepper  o Tabouli salad: parsley, bulgur, mint, scallions, cucumbers, tomato, radishes, lemon juice, olive oil, salt and pepper.

## 2016-11-17 NOTE — Progress Notes (Signed)
Chief Complaint  Patient presents with  . Follow-up    A1c check today.     HPI: [name] 66 y.o. come in for Chronic disease management    DM  tinks its high   Not as much sweets.  Just got back from vacation  Taking 2000 metformin per day  Most days   BP ok   Breast fu soon    Left post ear head area sometimes to head recently this week  ? Sinus other not bad jsut checking changes with neck rotation no other sx .  Ok for lfu vaccine ROS: See pertinent positives and negatives per HPI. No cp sob  Left breast pull at times .   Past Medical History:  Diagnosis Date  . 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 Anxiety   . Arthritis   . 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 Cancer [facility])    right breast cancer  . Cough due to ACE inhibitor 04/28/2011  . Depression   . Ganglion cyst   . GERD (gastroesophageal reflux disease)    occ  . HYPERTENSION 11/22/2007   echo card 2004 nl lv function   . NECK PAIN 11/22/2007  . Other specified disorder of skin 12/25/2008  . PONV (postoperative nausea and vomiting)   . TOBACCO USE, QUIT 12/25/2008    Family History  Problem Relation Age of Onset  . Alzheimer's disease Mother   . Hypertension Mother   . Anxiety disorder Mother   . Diabetes Father   . Alcoholism Father   . Diabetes Other        1st degree relative  . Bladder Cancer Brother     Social History   Social History  . Marital status: Divorced    Spouse name: N/A  . Number of children: N/A  . Years of education: N/A   Occupational History  . Hairdresser    Social History Main Topics  . Smoking status: Former Smoker    Packs/day: 1.50    Years: 15.00    Types: Cigarettes    Quit date: 03/04/1987  . Smokeless tobacco: Never Used  . Alcohol use No  . Drug use: No  . Sexual activity: Not Asked   Other Topics Concern  . 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    Former tobacco  No tad     Divorced.       Brother temp , son back in for temporarily.  People      Dog     Outpatient Medications Prior to Visit  Medication Sig Dispense Refill  . ALPRAZolam (XANAX) 0.25 MG tablet Take 1 tablet (0.25 mg total) by mouth 3 (three) times daily as needed for anxiety. 30 tablet 0  . FLUoxetine (PROZAC) 10 MG capsule Take 1 capsule (10 mg total) by mouth daily. 90 capsule 3  . FLUoxetine (PROZAC) 20 MG capsule TAKE 1 CAPSULE BY MOUTH ONCE DAILY 30 capsule 5  . metFORMIN (GLUCOPHAGE-XR) 500 MG 24 hr tablet TAKE 2 TABLETS EVERY DAY 180 tablet 3  . metoprolol tartrate (LOPRESSOR) 50 MG tablet TAKE ONE-HALF TABLET BY MOUTH TWICE DAILY 90 tablet 1  . triamterene-hydrochlorothiazide (MAXZIDE-25) 37.5-25 MG tablet Take 1 tablet by mouth daily. 90 tablet 3   No facility-administered medications prior to visit.  EXAM:  BP 118/70 (BP Location: Right Arm, Patient Position: Sitting, Cuff Size: Normal)   Pulse 64   Temp 97.6 F (36.4 C) (Oral)   Wt 236 lb 8 oz (107.3 kg)   BMI 40.60 kg/m   Body mass index is 40.6 kg/m.  GENERAL: vitals reviewed and listed above, alert, oriented, appears well hydrated and in no acute distress HEENT: atraumatic, conjunctiva  clear, no obvious abnormalities on inspection of external nose and ears tmx retracted no lesion    OP : no lesion edema or exudate  NECK: no obvious masses on inspection palpation   No focal tenderness..  Neck slight dec rom to left   LUNGS: clear to auscultation bilaterally, no wheezes, rales or rhonchi, good air movement CV: HRRR, no clubbing cyanosis or  peripheral edema nl cap refill  MS: moves all extremities without noticeable focal  abnormality PSYCH: pleasant and cooperative, no obvious depression or anxiety Lab Results  Component Value Date   WBC 5.0 03/31/2016   HGB 15.1 (H) 03/31/2016   HCT 44.3 03/31/2016   PLT 177.0 03/31/2016   GLUCOSE 129 (H)  03/31/2016   CHOL 216 (H) 03/31/2016   TRIG 168.0 (H) 03/31/2016   HDL 49.40 03/31/2016   LDLDIRECT 153.4 12/20/2012   LDLCALC 133 (H) 03/31/2016   ALT 27 03/31/2016   AST 25 03/31/2016   NA 139 03/31/2016   K 3.7 03/31/2016   CL 101 03/31/2016   CREATININE 0.81 03/31/2016   BUN 18 03/31/2016   CO2 30 03/31/2016   TSH 2.93 03/31/2016   HGBA1C 6.8 11/17/2016   MICROALBUR 1.1 03/31/2016   BP Readings from Last 3 Encounters:  11/17/16 118/70  03/31/16 128/88  02/13/16 (!) 155/83   Wt Readings from Last 3 Encounters:  11/17/16 236 lb 8 oz (107.3 kg)  03/31/16 236 lb (107 kg)  02/13/16 230 lb (104.3 kg)     ASSESSMENT AND PLAN:  Discussed the following assessment and plan:  New onset type 2 diabetes mellitus ([place]) - Plan: POC HgB A1c  Essential hypertension  Hyperlipidemia, unspecified hyperlipidemia type  Need for influenza vaccination - Plan: Flu vaccine HIGH DOSE PF (Fluzone High dose)  Personal history of breast cancer  Medication management - Plan: POC HgB A1c  Neck pain  History of breast cancer 6 month yearly check up. A 1c today and flu vaccine    Advise  Statin med if ldl not below 100 or close at next chek .  revewie benefit risk with patient today .  -Patient advised to return or notify health care team  if  new concerns arise.  Patient Instructions   Your hemoglobin A1c was 6.8 today which was stable and acceptable. However reinstitute your attention to healthy eating and activity.  Still advise   Statin medication for cholesterol . As we discussed. If you can get your LDL down below 100 or close to 70 we can put off cholesterol medicine. Flu vaccine today. I think the head neck pain is related to your neck and not serious.Watch her posture prolong sitting let us know if more concerns.    Diabetes Mellitus and Standards of Medical Care Managing diabetes (diabetes mellitus) can be complicated. Your diabetes treatment may be managed by a team of  health care providers, including:  A diet and nutrition specialist (registered dietitian).  A nurse.  A certified diabetes educator (CDE).  A diabetes specialist (endocrinologist).  An eye doctor.  A primary care provider.  A dentist.  Your  health care providers follow a schedule in order to help you get the best quality of care. The following schedule is a general guideline for your diabetes management plan. Your health care providers may also give you more specific instructions. HbA1c ( hemoglobin A1c) test This test provides information about blood sugar (glucose) control over the previous 2-3 months. It is used to check whether your diabetes management plan needs to be adjusted.  If you are meeting your treatment goals, this test is done at least 2 times a year.  If you are not meeting treatment goals or if your treatment goals have changed, this test is done 4 times a year.  Blood pressure test  This test is done at every routine medical visit. For most people, the goal is less than 130/80. Ask your health care provider what your goal blood pressure should be. Dental and eye exams  Visit your dentist two times a year.  If you have type 1 diabetes, get an eye exam 3-5 years after you are diagnosed, and then once a year after your first exam. ? If you were diagnosed with type 1 diabetes as a child, get an eye exam when you are age 51 or older and have had diabetes for 3-5 years. After the first exam, you should get an eye exam once a year.  If you have type 2 diabetes, have an eye exam as soon as you are diagnosed, and then once a year after your first exam. Foot care exam  Visual foot exams are done at every routine medical visit. The exams check for cuts, bruises, redness, blisters, sores, or other problems with the feet.  A complete foot exam is done by your health care provider once a year. This exam includes an inspection of the structure and skin of your feet, and a  check of the pulses and sensation in your feet. ? Type 1 diabetes: Get your first exam 3-5 years after diagnosis. ? Type 2 diabetes: Get your first exam as soon as you are diagnosed.  Check your feet every day for cuts, bruises, redness, blisters, or sores. If you have any of these or other problems that are not healing, contact your health care provider. Kidney function test ( urine microalbumin)  This test is done once a year. ? Type 1 diabetes: Get your first test 5 years after diagnosis. ? Type 2 diabetes: Get your first test as soon as you are diagnosed.  If you have chronic kidney disease (CKD), get a serum creatinine and estimated glomerular filtration rate (eGFR) test once a year. Lipid profile (cholesterol, HDL, LDL, triglycerides)  This test should be done when you are diagnosed with diabetes, and every 5 years after the first test. If you are on medicines to lower your cholesterol, you may need to get this test done every year. ? The goal for LDL is less than 100 mg/dL (5.5 mmol/L). If you are at high risk, the goal is less than 70 mg/dL (3.9 mmol/L). ? The goal for HDL is 40 mg/dL (2.2 mmol/L) for men and 50 mg/dL(2.8 mmol/L) for women. An HDL cholesterol of 60 mg/dL (3.3 mmol/L) or higher gives some protection against heart disease. ? The goal for triglycerides is less than 150 mg/dL (8.3 mmol/L). Immunizations  The yearly flu (influenza) vaccine is recommended for everyone 6 months or older who has diabetes.  The pneumonia (pneumococcal) vaccine is recommended for everyone 2 years or older who has diabetes. If you are 38  or older, you may get the pneumonia vaccine as a series of two separate shots.  The hepatitis B vaccine is recommended for adults shortly after they have been diagnosed with diabetes.  The Tdap (tetanus, diphtheria, and pertussis) vaccine should be given: ? According to normal childhood vaccination schedules, for children. ? Every 10 years, for adults who  have diabetes.  The shingles vaccine is recommended for people who have had chicken pox and are 50 years or older. Mental and emotional health  Screening for symptoms of eating disorders, anxiety, and depression is recommended at the time of diagnosis and afterward as needed. If your screening shows that you have symptoms (you have a positive screening result), you may need further evaluation and be referred to a mental health care provider. Diabetes self-management education  Education about how to manage your diabetes is recommended at diagnosis and ongoing as needed. Treatment plan  Your treatment plan will be reviewed at every medical visit. Summary  Managing diabetes (diabetes mellitus) can be complicated. Your diabetes treatment may be managed by a team of health care providers.  Your health care providers follow a schedule in order to help you get the best quality of care.  Standards of care including having regular physical exams, blood tests, blood pressure monitoring, immunizations, screening tests, and education about how to manage your diabetes.  Your health care providers may also give you more specific instructions based on your individual health. This information is not intended to replace advice given to you by your health care provider. Make sure you discuss any questions you have with your health care provider. Document Released: 12/15/2008 Document Revised: 11/16/2015 Document Reviewed: 11/16/2015 Elsevier Interactive Patient Education  [employer].      Why follow it? Research shows. . Those who follow the Mediterranean diet have a reduced risk of heart disease  . The diet is associated with a reduced incidence of Parkinson's and Alzheimer's diseases . People following the diet may have longer life expectancies and lower rates of chronic diseases  . The Dietary Guidelines for Americans recommends the Mediterranean diet as an eating plan to promote health and  prevent disease  What Is the Mediterranean Diet?  . Healthy eating plan based on typical foods and recipes of Mediterranean-style cooking . The diet is primarily a plant based diet; these foods should make up a majority of meals   Starches - Plant based foods should make up a majority of meals - They are an important sources of vitamins, minerals, energy, antioxidants, and fiber - Choose whole grains, foods high in fiber and minimally processed items  - Typical grain sources include wheat, oats, barley, corn, brown rice, bulgar, farro, millet, polenta, couscous  - Various types of beans include chickpeas, lentils, fava beans, black beans, white beans   Fruits  Veggies - Large quantities of antioxidant rich fruits & veggies; 6 or more servings  - Vegetables can be eaten raw or lightly drizzled with oil and cooked  - Vegetables common to the traditional Mediterranean Diet include: artichokes, arugula, beets, broccoli, brussel sprouts, cabbage, carrots, celery, collard greens, cucumbers, eggplant, kale, leeks, lemons, lettuce, mushrooms, okra, onions, peas, peppers, potatoes, pumpkin, radishes, rutabaga, shallots, spinach, sweet potatoes, turnips, zucchini - Fruits common to the Mediterranean Diet include: apples, apricots, avocados, cherries, clementines, dates, figs, grapefruits, grapes, melons, nectarines, oranges, peaches, pears, pomegranates, strawberries, tangerines  Fats - Replace butter and margarine with healthy oils, such as olive oil, canola oil, and tahini  -  Limit nuts to no more than a handful a day  - Nuts include walnuts, almonds, pecans, pistachios, pine nuts  - Limit or avoid candied, honey roasted or heavily salted nuts - Olives are central to the Mediterranean diet - can be eaten whole or used in a variety of dishes   Meats Protein - Limiting red meat: no more than a few times a month - When eating red meat: choose lean cuts and keep the portion to the size of deck of cards -  Eggs: approx. 0 to 4 times a week  - Fish and lean poultry: at least 2 a week  - Healthy protein sources include, chicken, [place], lean beef, lamb - Increase intake of seafood such as tuna, salmon, trout, mackerel, shrimp, scallops - Avoid or limit high fat processed meats such as sausage and bacon  Dairy - Include moderate amounts of low fat dairy products  - Focus on healthy dairy such as fat free yogurt, skim milk, low or reduced fat cheese - Limit dairy products higher in fat such as whole or 2% milk, cheese, ice cream  Alcohol - Moderate amounts of red wine is ok  - No more than 5 oz daily for women (all ages) and men older than age 3  - No more than 10 oz of wine daily for men younger than 3  Other - Limit sweets and other desserts  - Use herbs and spices instead of salt to flavor foods  - Herbs and spices common to the traditional Mediterranean Diet include: basil, bay leaves, chives, cloves, cumin, fennel, garlic, lavender, marjoram, mint, oregano, parsley, pepper, rosemary, sage, savory, sumac, tarragon, thyme   It's not just a diet, it's a lifestyle:  . The Mediterranean diet includes lifestyle factors typical of those in the region  . Foods, drinks and meals are best eaten with others and savored . Daily physical activity is important for overall good health . This could be strenuous exercise like running and aerobics . This could also be more leisurely activities such as walking, housework, yard-work, or taking the stairs . Moderation is the key; a balanced and healthy diet accommodates most foods and drinks . Consider portion sizes and frequency of consumption of certain foods   Meal Ideas & Options:  . Breakfast:  o Whole wheat toast or whole wheat English muffins with peanut butter & hard boiled egg o Steel cut oats topped with apples & cinnamon and skim milk  o Fresh fruit: banana, strawberries, melon, berries, peaches  o Smoothies: strawberries, bananas, greek yogurt,  peanut butter o Low fat greek yogurt with blueberries and granola  o Egg white omelet with spinach and mushrooms o Breakfast couscous: whole wheat couscous, apricots, skim milk, cranberries  . Sandwiches:  o Hummus and grilled vegetables (peppers, zucchini, squash) on whole wheat bread   o Grilled chicken on whole wheat pita with lettuce, tomatoes, cucumbers or tzatziki  o Tuna salad on whole wheat bread: tuna salad made with greek yogurt, olives, red peppers, capers, green onions o Garlic rosemary lamb pita: lamb sauted with garlic, rosemary, salt & pepper; add lettuce, cucumber, greek yogurt to pita - flavor with lemon juice and black pepper  . Seafood:  o Mediterranean grilled salmon, seasoned with garlic, basil, parsley, lemon juice and black pepper o Shrimp, lemon, and spinach whole-grain pasta salad made with low fat greek yogurt  o Seared scallops with lemon orzo  o Seared tuna steaks seasoned salt, pepper, coriander topped with tomato  mixture of olives, tomatoes, olive oil, minced garlic, parsley, green onions and cappers  . Meats:  o Herbed greek chicken salad with kalamata olives, cucumber, feta  o Red bell peppers stuffed with spinach, bulgur, lean ground beef (or lentils) & topped with feta   o Kebabs: skewers of chicken, tomatoes, onions, zucchini, squash  o [place] burgers: made with red onions, mint, dill, lemon juice, feta cheese topped with roasted red peppers . Vegetarian o Cucumber salad: cucumbers, artichoke hearts, celery, red onion, feta cheese, tossed in olive oil & lemon juice  o Hummus and whole grain pita points with a greek salad (lettuce, tomato, feta, olives, cucumbers, red onion) o Lentil soup with celery, carrots made with vegetable broth, garlic, salt and pepper  o Tabouli salad: parsley, bulgur, mint, scallions, cucumbers, tomato, radishes, lemon juice, olive oil, salt and pepper.          Standley Brooking. Rusty Villella M.D.

## 2016-11-21 ENCOUNTER — Encounter: Payer: Self-pay | Admitting: Internal Medicine

## 2016-11-24 DIAGNOSIS — H25813 Combined forms of age-related cataract, bilateral: Secondary | ICD-10-CM | POA: Diagnosis not present

## 2016-11-24 DIAGNOSIS — H524 Presbyopia: Secondary | ICD-10-CM | POA: Diagnosis not present

## 2016-11-24 DIAGNOSIS — H5203 Hypermetropia, bilateral: Secondary | ICD-10-CM | POA: Diagnosis not present

## 2016-12-29 DIAGNOSIS — Z9013 Acquired absence of bilateral breasts and nipples: Secondary | ICD-10-CM | POA: Diagnosis not present

## 2016-12-29 DIAGNOSIS — R0789 Other chest pain: Secondary | ICD-10-CM | POA: Diagnosis not present

## 2016-12-29 DIAGNOSIS — Z9221 Personal history of antineoplastic chemotherapy: Secondary | ICD-10-CM | POA: Diagnosis not present

## 2016-12-29 DIAGNOSIS — Z171 Estrogen receptor negative status [ER-]: Secondary | ICD-10-CM | POA: Diagnosis not present

## 2016-12-29 DIAGNOSIS — Z853 Personal history of malignant neoplasm of breast: Secondary | ICD-10-CM | POA: Diagnosis not present

## 2017-01-09 ENCOUNTER — Other Ambulatory Visit: Payer: Self-pay | Admitting: Internal Medicine

## 2017-01-10 DIAGNOSIS — J301 Allergic rhinitis due to pollen: Secondary | ICD-10-CM | POA: Diagnosis not present

## 2017-01-19 ENCOUNTER — Encounter: Payer: Self-pay | Admitting: Family Medicine

## 2017-01-19 ENCOUNTER — Ambulatory Visit (INDEPENDENT_AMBULATORY_CARE_PROVIDER_SITE_OTHER): Payer: Medicare Other | Admitting: Family Medicine

## 2017-01-19 VITALS — BP 126/74 | HR 69 | Temp 98.0°F | Ht 64.0 in | Wt 237.9 lb

## 2017-01-19 DIAGNOSIS — E1159 Type 2 diabetes mellitus with other circulatory complications: Secondary | ICD-10-CM

## 2017-01-19 DIAGNOSIS — E119 Type 2 diabetes mellitus without complications: Secondary | ICD-10-CM

## 2017-01-19 DIAGNOSIS — I10 Essential (primary) hypertension: Secondary | ICD-10-CM | POA: Diagnosis not present

## 2017-01-19 NOTE — Patient Instructions (Signed)
BEFORE YOU LEAVE: -follow up: 2-4 weeks with PCP  Take all medication doses daily.  Work on a healthy low sugar diet with lots of colorful vegetables (try to get 5-9 servings of vegetables per day.) Avoid processed foods, sweets, sodas and high sodium foods. Get regular exercise.  Bring log and cuff to your next appointment.

## 2017-01-19 NOTE — Progress Notes (Signed)
HPI:  [name] is a pleasant 66 year old with a past medical history significant for morbid obesity, anxiety, hypertension and diabetes here for an acute visit regarding her blood pressure.  She reports her blood pressure was up a few weeks ago had an oncology visit and she ended up going to an urgent care when she found that it was elevated at 150/90 at home a few days later.  She admits that she does not take all of her doses of her blood pressure medications.  She has been working to do better the last few days in the blood pressure has been a little lower.  Sometimes can feel it when her blood pressure is up as she feels like her face gets itchy and the top of her head gets itchy.  She admits to poor diet.  Denies shortness of breath, headache, swelling, chest pain.  ROS: See pertinent positives and negatives per HPI.  Past Medical History:  Diagnosis Date  . 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 Anxiety   . Arthritis   . 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 Cancer [facility])    right breast cancer  . Cough due to ACE inhibitor 04/28/2011  . Depression   . Ganglion cyst   . GERD (gastroesophageal reflux disease)    occ  . HYPERTENSION 11/22/2007   echo card 2004 nl lv function   . NECK PAIN 11/22/2007  . Other specified disorder of skin 12/25/2008  . PONV (postoperative nausea and vomiting)   . TOBACCO USE, QUIT 12/25/2008    Past Surgical History:  Procedure Laterality Date  . ABDOMINAL HYSTERECTOMY  1989  . APPENDECTOMY    . BILATERAL MASTECTOMY WITH RIGHT SENTINEL LYMPH NODE BIOPSY FOLLOWED BY RECONSTRUCTION Bilateral 03/10/2013   Performed by Jovita Kussmaul, MD at [place]  . BREAST RECONSTRUCTION Left 12/01/2013   dr Harlow Mares  . BREAST RECONSTRUCTION Left 12/01/2013   Performed by Crissie Reese, MD at [place]  2/15  . COLONOSCOPY    . INSERTION PORT-A-CATH Left 03/10/2013   Performed by Jovita Kuss[facility]ul[facility] MD at [facility] OR  . LAPAROSCOPIC  APPENDECTOMY    . MASTECTOMY COMPLETE / SIMPLE W/ SENTINEL NODE BIOPSY Bilateral   . PLACEMENT OF BILATERAL TISSUE EXPANDER WITH POSSIBLE FLEX HD (ACELLULAR HYDRATED DERMIS) FOR BREAST RECONSTRUCTION Bilateral 03/10/2013   Performed by Crissie Reese, MD at [place] / incision and drainage of ceroma Left 05/05/2013   Performed by Crissie Reese, MD at [place] Left 12/01/2014   Performed by Jovita Kussmaul, MD at [facility]  . SHOULDER SURGERY     right shoulder scope, open RCR, biceps tendonesis 12/17/15  . TISSUE EXPANDER FOR DELAYED LEFT BREAST RECONSTRUCTION Left 12/01/2013   Performed by Crissie Reese, MD at [place]  2001    Family History  Problem Relation Age of Onset  . Alzheimer's disease Mother   . Hypertension Mother   . Anxiety disorder Mother   . Diabetes Father   . Alcoholism Father   . Diabetes Other        1st degree relative  . Bladder Cancer Brother     Social History   Socioeconomic History  . Marital status: Divorced    Spouse name: None  . Number of children: None  . Years of education: None  . Highest education level:  None  Social Needs  . Financial resource strain: None  . Food insecurity - worry: None  . Food insecurity - inability: None  . Transportation needs - medical: None  . Transportation needs - non-medical: None  Occupational History  . Occupation: Hairdresser  Tobacco Use  . Smoking status: Former Smoker    Packs/day: 1.50    Years: 15.00    Pack years: 22.50    Types: Cigarettes    Last attempt to quit: 03/04/1987    Years since quitting: 29.9  . Smokeless tobacco: Never Used  Substance and Sexual Activity  . Alcohol use: No  . Drug use: No  . Sexual activity: None  Other Topics Concern  . 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    Former tobacco  No tad      Divorced.       Brother temp , son back in for temporarily.  People      Dog      Current Outpatient Medications:  .  ALPRAZolam (XANAX) 0.25 MG tablet, Take 1 tablet (0.25 mg total) by mouth 3 (three) times daily as needed for anxiety., Disp: 30 tablet, Rfl: 0 .  FLUoxetine (PROZAC) 20 MG capsule, TAKE 1 CAPSULE BY MOUTH ONCE DAILY, Disp: 30 capsule, Rfl: 5 .  metFORMIN (GLUCOPHAGE-XR) 500 MG 24 hr tablet, TAKE TWO TABLETS BY MOUTH ONCE DAILY, Disp: 180 tablet, Rfl: 3 .  metoprolol tartrate (LOPRESSOR) 50 MG tablet, TAKE ONE-HALF TABLET BY MOUTH TWICE DAILY, Disp: 90 tablet, Rfl: 1 .  triamterene-hydrochlorothiazide (MAXZIDE-25) 37.5-25 MG tablet, TAKE ONE TABLET BY MOUTH ONCE DAILY, Disp: 90 tablet, Rfl: 1  EXAM:  Vitals:   01/19/17 1125  BP: 126/74  Pulse: 69  Temp: 98 F (36.7 C)  SpO2: 97%    Body mass index is 40.84 kg/m.  GENERAL: vitals reviewed and listed above, alert, oriented, appears well hydrated and in no acute distress  HEENT: atraumatic, conjunttiva clear, no obvious abnormalities on inspection of external nose and ears  NECK: no obvious masses on inspection  LUNGS: clear to auscultation bilaterally, no wheezes, rales or rhonchi, good air movement  CV: HRRR, no peripheral edema  MS: moves all extremities without noticeable abnormality  PSYCH: pleasant and cooperative, no obvious depression or anxiety  ASSESSMENT AND PLAN:  Discussed the following assessment and plan:  Hypertension associated with diabetes ([place])  Morbid obesity ([place])  Diabetes mellitus without complication ([place])  -Her blood pressure looks okay today -Encouraged good medication compliance and discuss the possibility of keeping a separate pill box for her evening doses near where she eats her dinner -Stressed the importance of a healthy diet consisting of whole foods, low sugar, low sodium, low processed foods and regular exercise -advised that she try to lose about 10 pounds over  the next few months -She is quite anxious about her blood pressure, so we will have her follow-up with her primary care doctor in a couple weeks with a blood pressure cuff and log them to recheck again -Patient advised to return or notify a doctor immediately if symptoms worsen or persist or new concerns arise.  Patient Instructions  BEFORE YOU LEAVE: -follow up: 2-4 weeks with PCP  Take all medication doses daily.  Work on a healthy low sugar diet with lots of colorful vegetables (try to get 5-9 servings of vegetables per day.)  Avoid processed foods, sweets, sodas and high sodium foods. Get regular exercise.  Bring log and cuff to your next appointment.    Colin Benton R., DO

## 2017-03-05 DIAGNOSIS — G8911 Acute pain due to trauma: Secondary | ICD-10-CM | POA: Diagnosis not present

## 2017-03-05 DIAGNOSIS — S79919A Unspecified injury of unspecified hip, initial encounter: Secondary | ICD-10-CM | POA: Diagnosis not present

## 2017-03-06 DIAGNOSIS — S82842A Displaced bimalleolar fracture of left lower leg, initial encounter for closed fracture: Secondary | ICD-10-CM | POA: Diagnosis not present

## 2017-03-06 DIAGNOSIS — M79672 Pain in left foot: Secondary | ICD-10-CM | POA: Diagnosis not present

## 2017-03-06 NOTE — Progress Notes (Signed)
Chief Complaint  Patient presents with  . surgical clearance    broke left ankle 4 days ago - needing surgical repair.     HPI: [name] 67 y.o. come in for broken left  ankle  Clearance  For surgery ... Pins   Advised   Slipped on deck and fell in rain.  Last week .  brother helped her go to hospital . Using walker  And getting around.   BP:   up some  Daughter illness  .  Marland Kitchen    No breathing or heart sx.   But doing ok    no fever.  New sx  Problem  Bleeding     ROS: See pertinent positives and negatives per HPI. No bleeding cp sob edema new neuro sx  Headache neuro sx numbness   Past Medical History:  Diagnosis Date  . 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 Anxiety   . Arthritis   . 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 Cancer [facility])    right breast cancer  . Cough due to ACE inhibitor 04/28/2011  . Depression   . Ganglion cyst   . GERD (gastroesophageal reflux disease)    occ  . HYPERTENSION 11/22/2007   echo card 2004 nl lv function   . NECK PAIN 11/22/2007  . Other specified disorder of skin 12/25/2008  . PONV (postoperative nausea and vomiting)   . TOBACCO USE, QUIT 12/25/2008    Family History  Problem Relation Age of Onset  . Alzheimer's disease Mother   . Hypertension Mother   . Anxiety disorder Mother   . Diabetes Father   . Alcoholism Father   . Diabetes Other        1st degree relative  . Bladder Cancer Brother     Social History   Socioeconomic History  . Marital status: Divorced    Spouse name: None  . Number of children: None  . Years of education: None  . Highest education level: None  Social Needs  . Financial resource strain: None  . Food insecurity - worry: None  . Food insecurity - inability: None  . Transportation needs - medical: None  . Transportation needs - non-medical: None  Occupational History  . Occupation: Hairdresser  Tobacco Use  . Smoking status: Former Smoker    Packs/day: 1.50   Years: 15.00    Pack years: 22.50    Types: Cigarettes    Last attempt to quit: 03/04/1987    Years since quitting: 30.0  . Smokeless tobacco: Never Used  Substance and Sexual Activity  . Alcohol use: No  . Drug use: No  . Sexual activity: None  Other Topics Concern  . 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    Former tobacco  No tad     Divorced.       Brother temp , son back in for temporarily.  People      Dog     Outpatient Medications Prior to Visit  Medication Sig Dispense Refill  . ALPRAZolam (XANAX) 0.25 MG tablet Take 1 tablet (0.25 mg total) by mouth 3 (three) times daily as needed for anxiety. 30 tablet 0  . FLUoxetine (PROZAC) 20 MG capsule TAKE 1 CAPSULE BY MOUTH ONCE DAILY 30 capsule 5  . metFORMIN (GLUCOPHAGE-XR) 500 MG 24 hr tablet TAKE  TWO TABLETS BY MOUTH ONCE DAILY 180 tablet 3  . metoprolol tartrate (LOPRESSOR) 50 MG tablet TAKE ONE-HALF TABLET BY MOUTH TWICE DAILY 90 tablet 1  . triamterene-hydrochlorothiazide (MAXZIDE-25) 37.5-25 MG tablet TAKE ONE TABLET BY MOUTH ONCE DAILY 90 tablet 1   No facility-administered medications prior to visit.      EXAM:  BP (!) 98/56 (BP Location: Right Arm, Patient Position: Sitting, Cuff Size: Large)   Pulse 62   Temp 97.9 F (36.6 C) (Oral)   Wt 237 lb (107.5 kg)   BMI 40.68 kg/m   Body mass index is 40.68 kg/m.  GENERAL: vitals reviewed and listed above, alert, oriented, appears well hydrated and in no acute distress in WC left ankle foot  Wrapped  Splinted  HEENT: atraumatic, conjunctiva  clear, no obvious abnormalities on inspection of external nose and ears OP : no lesion edema or exudate  NECK: no obvious masses on inspection palpation  LUNGS: clear to auscultation bilaterally, no wheezes, rales or rhonchi, good air movement CV: HRRR, no clubbing cyanosis or  peripheral edema nl cap refill  MS: moves all extremities  lle  Splinted  Right no lesion or edema  \neuro nonfocal  Oriented x 2  PSYCH: pleasant and cooperative, no obvious depression or anxiety Lab Results  Component Value Date   WBC 6.4 03/09/2017   HGB 14.4 03/09/2017   HCT 42.9 03/09/2017   PLT 174.0 03/09/2017   GLUCOSE 169 (H) 03/09/2017   CHOL 203 (H) 03/09/2017   TRIG 209.0 (H) 03/09/2017   HDL 49.00 03/09/2017   LDLDIRECT 137.0 03/09/2017   LDLCALC 133 (H) 03/31/2016   ALT 29 03/09/2017   AST 26 03/09/2017   NA 139 03/09/2017   K 3.5 03/09/2017   CL 101 03/09/2017   CREATININE 0.88 03/09/2017   BUN 20 03/09/2017   CO2 29 03/09/2017   TSH 2.93 03/31/2016   HGBA1C 7.0 (H) 03/09/2017   MICROALBUR 1.1 03/31/2016   BP Readings from Last 3 Encounters:  03/09/17 (!) 98/56  01/19/17 126/74  11/17/16 118/70    ASSESSMENT AND PLAN:  Discussed the following assessment and plan:  Closed fracture of left ankle, sequela - Plan: Basic metabolic panel, CBC with Differential/Platelet, Hemoglobin A1c, Hepatic function panel, Lipid panel  Medication management - Plan: Basic metabolic panel, CBC with Differential/Platelet, Hemoglobin A1c, Hepatic function panel, Lipid panel  Hypertension associated with diabetes ([place]) - Plan: Basic metabolic panel, CBC with Differential/Platelet, Hemoglobin A1c, Hepatic function panel, Lipid panel  Diabetes mellitus without complication ([place]) - Plan: Basic metabolic panel, CBC with Differential/Platelet, Hemoglobin A1c, Hepatic function panel, Lipid panel  Hyperlipidemia, unspecified hyperlipidemia type - Plan: Basic metabolic panel, CBC with Differential/Platelet, Hemoglobin A1c, Hepatic function panel, Lipid panel  Pre-op evaluation Due for lab monitoring in a few weeks  Will do todayu NFasting had  biscuit)   Ok medically for surgery   Acceptable risk for surgery .   Letter written and given to patient .  Due for exam in march  Or thereafter when convenient .  -Patient advised to return or notify  health care team  if  new concerns arise.  Patient Instructions  Ok for surgery .   Lab today for monitoring   As we discussed .    Will let you know when labs back.      Standley Brooking. Ollie Delano M.D.

## 2017-03-09 ENCOUNTER — Ambulatory Visit (INDEPENDENT_AMBULATORY_CARE_PROVIDER_SITE_OTHER): Payer: Medicare Other | Admitting: Internal Medicine

## 2017-03-09 ENCOUNTER — Encounter: Payer: Self-pay | Admitting: Internal Medicine

## 2017-03-09 VITALS — BP 98/56 | HR 62 | Temp 97.9°F | Wt 237.0 lb

## 2017-03-09 DIAGNOSIS — E1159 Type 2 diabetes mellitus with other circulatory complications: Secondary | ICD-10-CM | POA: Diagnosis not present

## 2017-03-09 DIAGNOSIS — S82892S Other fracture of left lower leg, sequela: Secondary | ICD-10-CM

## 2017-03-09 DIAGNOSIS — I10 Essential (primary) hypertension: Secondary | ICD-10-CM | POA: Diagnosis not present

## 2017-03-09 DIAGNOSIS — E119 Type 2 diabetes mellitus without complications: Secondary | ICD-10-CM

## 2017-03-09 DIAGNOSIS — Z01818 Encounter for other preprocedural examination: Secondary | ICD-10-CM | POA: Diagnosis not present

## 2017-03-09 DIAGNOSIS — E785 Hyperlipidemia, unspecified: Secondary | ICD-10-CM

## 2017-03-09 DIAGNOSIS — Z79899 Other long term (current) drug therapy: Secondary | ICD-10-CM

## 2017-03-09 LAB — HEPATIC FUNCTION PANEL
ALBUMIN: 4.1 g/dL (ref 3.5–5.2)
ALT: 29 U/L (ref 0–35)
AST: 26 U/L (ref 0–37)
Alkaline Phosphatase: 62 U/L (ref 39–117)
BILIRUBIN TOTAL: 1.2 mg/dL (ref 0.2–1.2)
Bilirubin, Direct: 0.2 mg/dL (ref 0.0–0.3)
Total Protein: 6.5 g/dL (ref 6.0–8.3)

## 2017-03-09 LAB — BASIC METABOLIC PANEL
BUN: 20 mg/dL (ref 6–23)
CHLORIDE: 101 meq/L (ref 96–112)
CO2: 29 mEq/L (ref 19–32)
CREATININE: 0.88 mg/dL (ref 0.40–1.20)
Calcium: 9.3 mg/dL (ref 8.4–10.5)
GFR: 68.25 mL/min (ref >60.00)
GLUCOSE: 169 mg/dL — AB (ref 70–99)
Potassium: 3.5 mEq/L (ref 3.5–5.1)
Sodium: 139 mEq/L (ref 135–145)

## 2017-03-09 LAB — CBC WITH DIFFERENTIAL/PLATELET
BASOS PCT: 0.4 % (ref 0.0–3.0)
Basophils Absolute: 0 10*3/uL (ref 0.0–0.1)
EOS ABS: 0.2 10*3/uL (ref 0.0–0.7)
Eosinophils Relative: 2.6 % (ref 0.0–5.0)
HCT: 42.9 % (ref 36.0–46.0)
Hemoglobin: 14.4 g/dL (ref 12.0–15.0)
LYMPHS ABS: 1.4 10*3/uL (ref 0.7–4.0)
Lymphocytes Relative: 21.8 % (ref 12.0–46.0)
MCHC: 33.7 g/dL (ref 30.0–36.0)
MCV: 89 fl (ref 78.0–100.0)
MONO ABS: 0.6 10*3/uL (ref 0.1–1.0)
Monocytes Relative: 8.9 % (ref 3.0–12.0)
NEUTROS ABS: 4.2 10*3/uL (ref 1.4–7.7)
NEUTROS PCT: 66.3 % (ref 43.0–77.0)
PLATELETS: 174 10*3/uL (ref 150.0–400.0)
RBC: 4.82 Mil/uL (ref 3.87–5.11)
RDW: 13.6 % (ref 11.5–15.5)
WBC: 6.4 10*3/uL (ref 4.0–10.5)

## 2017-03-09 LAB — LIPID PANEL
CHOL/HDL RATIO: 4
Cholesterol: 203 mg/dL — ABNORMAL HIGH (ref 0–200)
HDL: 49 mg/dL (ref >39.00)
NonHDL: 154.27
TRIGLYCERIDES: 209 mg/dL — AB (ref 0.0–149.0)
VLDL: 41.8 mg/dL — AB (ref 0.0–40.0)

## 2017-03-09 LAB — HEMOGLOBIN A1C: HEMOGLOBIN A1C: 7 % — AB (ref 4.6–6.5)

## 2017-03-09 LAB — LDL CHOLESTEROL, DIRECT: Direct LDL: 137 mg/dL

## 2017-03-09 NOTE — Patient Instructions (Addendum)
[place] for surgery .   Lab today for monitoring   As we discussed .    Will let you know when labs back.

## 2017-03-12 ENCOUNTER — Telehealth: Payer: Self-pay | Admitting: Family Medicine

## 2017-03-12 DIAGNOSIS — M25572 Pain in left ankle and joints of left foot: Secondary | ICD-10-CM | POA: Diagnosis not present

## 2017-03-12 DIAGNOSIS — M79609 Pain in unspecified limb: Secondary | ICD-10-CM | POA: Diagnosis not present

## 2017-03-12 DIAGNOSIS — Z01818 Encounter for other preprocedural examination: Secondary | ICD-10-CM | POA: Diagnosis not present

## 2017-03-12 DIAGNOSIS — S82842A Displaced bimalleolar fracture of left lower leg, initial encounter for closed fracture: Secondary | ICD-10-CM | POA: Diagnosis not present

## 2017-03-12 NOTE — Telephone Encounter (Signed)
Copied from [place] [id number]. Topic: Medical Record Request - Provider/Facility Request >> Mar 12, 2017 11:15 AM Aurelio Brash B wrote:  Ivin Booty from [facility]  states Dr Regis Bill has cleared pt for surgery and would like a copy of the lab results from 1/7 faxed to her at [phone]  Route to [facility] for [place] clinics. For all other clinics, route to the clinic's PEC Pool.

## 2017-03-16 NOTE — Telephone Encounter (Signed)
Copied from [place]. Topic: General - Other >> Mar 16, 2017  8:52 AM Valla Leaver wrote: Reason for CRM: Patient needs her labs from 01/07sent to [place], [place]. They need them as soon as possible to clear her for surgery this Thursday at 03/20/2017.

## 2017-03-16 NOTE — Telephone Encounter (Signed)
Labs faxed to number given below.  Pt aware to contact our office if Ervin Knack does not receive the faxed records.

## 2017-03-19 DIAGNOSIS — Z6839 Body mass index (BMI) 39.0-39.9, adult: Secondary | ICD-10-CM | POA: Diagnosis not present

## 2017-03-19 DIAGNOSIS — S82402D Unspecified fracture of shaft of left fibula, subsequent encounter for closed fracture with routine healing: Secondary | ICD-10-CM | POA: Diagnosis not present

## 2017-03-19 DIAGNOSIS — Z79899 Other long term (current) drug therapy: Secondary | ICD-10-CM | POA: Diagnosis not present

## 2017-03-19 DIAGNOSIS — E119 Type 2 diabetes mellitus without complications: Secondary | ICD-10-CM | POA: Diagnosis not present

## 2017-03-19 DIAGNOSIS — Z7984 Long term (current) use of oral hypoglycemic drugs: Secondary | ICD-10-CM | POA: Diagnosis not present

## 2017-03-19 DIAGNOSIS — S82842A Displaced bimalleolar fracture of left lower leg, initial encounter for closed fracture: Secondary | ICD-10-CM | POA: Diagnosis not present

## 2017-03-19 DIAGNOSIS — Z853 Personal history of malignant neoplasm of breast: Secondary | ICD-10-CM | POA: Diagnosis not present

## 2017-03-19 DIAGNOSIS — I10 Essential (primary) hypertension: Secondary | ICD-10-CM | POA: Diagnosis not present

## 2017-03-19 DIAGNOSIS — F418 Other specified anxiety disorders: Secondary | ICD-10-CM | POA: Diagnosis not present

## 2017-03-19 DIAGNOSIS — M93872 Other specified osteochondropathies, left ankle and foot: Secondary | ICD-10-CM | POA: Diagnosis not present

## 2017-03-19 DIAGNOSIS — Z87891 Personal history of nicotine dependence: Secondary | ICD-10-CM | POA: Diagnosis not present

## 2017-03-19 HISTORY — PX: ORIF ANKLE FRACTURE: SUR919

## 2017-03-25 DIAGNOSIS — E1165 Type 2 diabetes mellitus with hyperglycemia: Secondary | ICD-10-CM | POA: Diagnosis not present

## 2017-03-25 DIAGNOSIS — L508 Other urticaria: Secondary | ICD-10-CM | POA: Diagnosis not present

## 2017-03-31 ENCOUNTER — Other Ambulatory Visit: Payer: Self-pay | Admitting: Internal Medicine

## 2017-03-31 MED ORDER — METOPROLOL TARTRATE 50 MG PO TABS: 25.0000 mg | ORAL_TABLET | Freq: Two times a day (BID) | ORAL | 1 refills | Status: DC

## 2017-04-13 DIAGNOSIS — S82842D Displaced bimalleolar fracture of left lower leg, subsequent encounter for closed fracture with routine healing: Secondary | ICD-10-CM | POA: Diagnosis not present

## 2017-04-13 DIAGNOSIS — M25671 Stiffness of right ankle, not elsewhere classified: Secondary | ICD-10-CM | POA: Diagnosis not present

## 2017-04-13 DIAGNOSIS — M25571 Pain in right ankle and joints of right foot: Secondary | ICD-10-CM | POA: Diagnosis not present

## 2017-04-16 DIAGNOSIS — M25571 Pain in right ankle and joints of right foot: Secondary | ICD-10-CM | POA: Diagnosis not present

## 2017-04-16 DIAGNOSIS — S82842D Displaced bimalleolar fracture of left lower leg, subsequent encounter for closed fracture with routine healing: Secondary | ICD-10-CM | POA: Diagnosis not present

## 2017-04-16 DIAGNOSIS — M25671 Stiffness of right ankle, not elsewhere classified: Secondary | ICD-10-CM | POA: Diagnosis not present

## 2017-04-20 DIAGNOSIS — S82842D Displaced bimalleolar fracture of left lower leg, subsequent encounter for closed fracture with routine healing: Secondary | ICD-10-CM | POA: Diagnosis not present

## 2017-04-20 DIAGNOSIS — M25571 Pain in right ankle and joints of right foot: Secondary | ICD-10-CM | POA: Diagnosis not present

## 2017-04-20 DIAGNOSIS — M25671 Stiffness of right ankle, not elsewhere classified: Secondary | ICD-10-CM | POA: Diagnosis not present

## 2017-04-22 DIAGNOSIS — S82842D Displaced bimalleolar fracture of left lower leg, subsequent encounter for closed fracture with routine healing: Secondary | ICD-10-CM | POA: Diagnosis not present

## 2017-04-22 DIAGNOSIS — M25671 Stiffness of right ankle, not elsewhere classified: Secondary | ICD-10-CM | POA: Diagnosis not present

## 2017-04-22 DIAGNOSIS — M25571 Pain in right ankle and joints of right foot: Secondary | ICD-10-CM | POA: Diagnosis not present

## 2017-04-27 DIAGNOSIS — S82842D Displaced bimalleolar fracture of left lower leg, subsequent encounter for closed fracture with routine healing: Secondary | ICD-10-CM | POA: Diagnosis not present

## 2017-04-27 DIAGNOSIS — M25571 Pain in right ankle and joints of right foot: Secondary | ICD-10-CM | POA: Diagnosis not present

## 2017-04-27 DIAGNOSIS — M25671 Stiffness of right ankle, not elsewhere classified: Secondary | ICD-10-CM | POA: Diagnosis not present

## 2017-04-30 DIAGNOSIS — S82842D Displaced bimalleolar fracture of left lower leg, subsequent encounter for closed fracture with routine healing: Secondary | ICD-10-CM | POA: Diagnosis not present

## 2017-05-01 NOTE — Progress Notes (Signed)
Chief Complaint  Patient presents with  . Annual Exam    Pt keeps having random recurrent hives. Pt concerned of knot on left mid-back on bra line.     HPI: [name] 67 y.o. comes in today foryearly  Chronic disease management .Since last visit. Recovering from fracture and   Now in boot  .  Multiple concerns  today  Post prandial diarrehea  Ever since chole  Not that bad until  More recnetly and   Interferes with  Life sytle  No pain blood or  voming and mosre immediate after eating.    HIves episode and after surgery had tramadol and antibiotic began with a c  Eventually had to take pred   Now soime itching and if takes benadry can make subside .  ? If alfalfa in vits  That she was taking>   Took today .   No part  Meat rtiggered .   Noted non tender nodule left back   ( no shower for 2-3 months) non tender   DM sthinks sugars are ok but has been inactive   Fluoxetine still helpful and to continue   Or[facility]o  [facility]  orig left ankle out of boot     Health Maintenance  Topic Date Due  . DEXA SCAN  10/17/2015  . FOOT EXAM  03/31/2017  . MAMMOGRAM  11/18/2026 (Originally 01/15/2015)  . OPHTHALMOLOGY EXAM  05/26/2017  . HEMOGLOBIN A1C  09/06/2017  . URINE MICROALBUMIN  05/05/2018  . COLONOSCOPY  12/05/2018  . TETANUS/TDAP  12/21/2022  . INFLUENZA VACCINE  Completed  . Hepatitis C Screening  Completed  . PNA vac Low Risk Adult  Completed   Health Maintenance Review LIFESTYLE:  Exercise:  reahab  Tobacco/ETS: no Alcohol:  no Sugar beverages:  ocass rare  Sleep: 8  Drug use: no HH: 1 Work on firday       Hearing: ok  Vision:  No limitations at present . Last eye check UTD  Safety:  Has smoke detector and wears seat belts.  . No excess sun exposure. :   Memory: Felt to be good  , no concern from her or her family.  Depression: No anhedonia unusual crying or depressive symptoms  Nutrition: Eats well balanced diet; adequate calcium and  vitamin D. No swallowing chewing problems..  Other healthcare providers:  Reviewed today .Marland Kitchen   Preventive parameters: up-to-date  Reviewed   ADLS:   There are no problems or need for assistance  driving, feeding, obtaining food, dressing, toileting and bathing, managing money using phone. She is independent.  xnow with le injury    ROS:   See hpi  GEN/ HEENT: No fever, significant weight changes sweats headaches vision problems hearing changes, CV/ PULM; No chest pain shortness of breath cough, syncope,edema  change in exercise tolerance. GI /GU: No adominal pain, vomiting, change in bowel habits. No blood in the stool. No significant GU symptoms. SKIN/HEME: ,no acute skin rashes suspicious lesions or bleeding. No lymphadenopathy, nodules, masses.  NEURO/ PSYCH:  No neurologic signs such as weakness numbness. No depression anxiety. IMM/ Allergy: No unusual infections.  Allergy .  ? REST of 12 system review negative except as per HPI   Past Medical History:  Diagnosis Date  . 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 Anxiety   . Arthritis   . 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 Cancer [facility])    right breast cancer  . Cough due  to ACE inhibitor 04/28/2011  . Depression   . Ganglion cyst   . GERD (gastroesophageal reflux disease)    occ  . HYPERTENSION 11/22/2007   echo card 2004 nl lv function   . NECK PAIN 11/22/2007  . Other specified disorder of skin 12/25/2008  . PONV (postoperative nausea and vomiting)   . TOBACCO USE, QUIT 12/25/2008   Past Surgical History:  Procedure Laterality Date  . ABDOMINAL HYSTERECTOMY  1989  . APPENDECTOMY    . BREAST RECONSTRUCTION Left 12/01/2013   dr Harlow Mares  . BREAST RECONSTRUCTION Left 12/01/2013   Procedure:  BREAST RECONSTRUCTION;  Surgeon: Crissie Reese, MD;  Location: [facility];  Service: Plastics;  Laterality: Left;  . BREAST RECONSTRUCTION WITH PLACEMENT OF TISSUE EXPANDER AND FLEX HD (ACELLULAR HYDRATED DERMIS)  Bilateral 03/10/2013   Procedure: PLACEMENT OF BILATERAL TISSUE EXPANDER WITH POSSIBLE FLEX HD (ACELLULAR HYDRATED DERMIS) FOR BREAST RECONSTRUCTION;  Surgeon: Crissie Reese, MD;  Location: [place];  Service: Plastics;  Laterality: Bilateral;  . CHOLECYSTECTOMY  2/15  . COLONOSCOPY    . LAPAROSCOPIC APPENDECTOMY    . MASTECTOMY COMPLETE / SIMPLE W/ SENTINEL NODE BIOPSY Bilateral   . MASTECTOMY W/ SENTINEL NODE BIOPSY Bilateral 03/10/2013   Procedure: BILATERAL MASTECTOMY WITH RIGHT SENTINEL LYMPH NODE BIOPSY FOLLOWED BY RECONSTRUCTION;  Surgeon: Merrie Roof, MD;  Location: [facility];  Service: General;  Laterality: Bilateral;  . ORIF ANKLE FRACTURE Left 03/19/2017   [facility]  [place]  . PORT-A-CATH REMOVAL Left 12/01/2014   Procedure: REMOVAL PORT-A-CATH;  Surgeon: Autumn Messing III, MD;  Location: [place];  Service: General;  Laterality: Left;  . PORTACATH PLACEMENT Left 03/10/2013   Procedure: INSERTION PORT-A-CATH;  Surgeon: Merrie Roof, MD;  Location: [facility];  Service: General;  Laterality: Left;  . REMOVAL OF TISSUE EXPANDER AND PLACEMENT OF IMPLANT Left 05/05/2013   Procedure: REMOVAL OF LEFT BREAST TISSUE EXPANDER / incision and drainage of ceroma;  Surgeon: Crissie Reese, MD;  Location: [place];  Service: Plastics;  Laterality: Left;  . SHOULDER SURGERY     right shoulder scope, open RCR, biceps tendonesis 12/17/15  . TISSUE EXPANDER PLACEMENT Left 12/01/2013   Procedure: TISSUE EXPANDER FOR DELAYED LEFT BREAST RECONSTRUCTION;  Surgeon: Crissie Reese, MD;  Location: [place];  Service: Plastics;  Laterality: Left;  . WRIST SURGERY  2001     Family History  Problem Relation Age of Onset  . Alzheimer's disease Mother   . Hypertension Mother   . Anxiety disorder Mother   . Diabetes Father   . Alcoholism Father   . Diabetes Other        1st degree relative  . Bladder Cancer Brother     Social History   Socioeconomic History  . Marital status: Divorced    Spouse name: None  .  Number of children: None  . Years of education: None  . Highest education level: None  Social Needs  . Financial resource strain: None  . Food insecurity - worry: None  . Food insecurity - inability: None  . Transportation needs - medical: None  . Transportation needs - non-medical: None  Occupational History  . Occupation: Hairdresser  Tobacco Use  . Smoking status: Former Smoker    Packs/day: 1.50    Years: 15.00    Pack years: 22.50    Types: Cigarettes    Last attempt to quit: 03/04/1987    Years since quitting: 30.1  . Smokeless tobacco: Never Used  Substance and  Sexual Activity  . Alcohol use: No  . Drug use: No  . Sexual activity: None  Other Topics Concern  . 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    Former tobacco  No tad     Divorced.       Brother temp , son back in for temporarily.  People      Dog     Outpatient Encounter Medications as of 05/04/2017  Medication Sig  . ALPRAZolam (XANAX) 0.25 MG tablet Take 1 tablet (0.25 mg total) by mouth 3 (three) times daily as needed for anxiety.  . diphenhydrAMINE (BENADRYL) 25 MG tablet Take 25 mg by mouth every 6 (six) hours as needed.  Marland Kitchen FLUoxetine (PROZAC) 20 MG capsule TAKE 1 CAPSULE BY MOUTH ONCE DAILY  . metFORMIN (GLUCOPHAGE-XR) 500 MG 24 hr tablet TAKE TWO TABLETS BY MOUTH ONCE DAILY  . metoprolol tartrate (LOPRESSOR) 50 MG tablet Take 0.5 tablets (25 mg total) by mouth 2 (two) times daily.  Marland Kitchen triamterene-hydrochlorothiazide (MAXZIDE-25) 37.5-25 MG tablet TAKE ONE TABLET BY MOUTH ONCE DAILY   No facility-administered encounter medications on file as of 05/04/2017.     EXAM:  BP 136/84 (BP Location: Right Arm, Patient Position: Sitting, Cuff Size: Normal)   Pulse 65   Temp 97.6 F (36.4 C) (Oral)   Ht 5\' 4"  (1.626 m)   Wt 229 lb 4.8 oz (104 kg)   BMI 39.36 kg/m   Body mass index is 39.36 kg/m.  Physical  Exam: Vital signs reviewed [id number] is a well-developed well-nourished alert cooperative   who appears stated age in no acute distress.  HEENT: normocephalic atraumatic , Eyes: PERRL EOM's full, conjunctiva clear, Nares: paten,t no deformity discharge or tenderness., Ears: no deformity EAC's clear TMs with normal landmarks. Mouth: clear OP, no lesions, edema.  Moist mucous membranes. Dentition in adequate repair. NECK: supple without masses, thyromegaly or bruits. CHEST/PULM:  Clear to auscultation and percussion breath sounds equal no wheeze , rales or rhonchi. No chest wall deformities or tenderness. CV: PMI is nondisplaced, S1 S2 no gallops, murmurs, rubs. Peripheral pulses are full without delay.No JVD .  Breast reconstructed no masses  ABDOMEN: Bowel sounds normal nontender  No guard or rebound, no hepato splenomegal no CVA tenderness.   Extremtities:  No clubbing cyanosis or edema, no acute joint swelling or redness no focal atrophy lle in brace   NEURO:  Oriented x3, cranial nerves 3-12 appear to be intact, no obvious focal weakness,gait within normal limits no abnormal reflexes or asymmetrical antalgic    Sensation seems intact  SKIN: No acute rashes normal turgor, color, no bruising or petechiae. Left thrunk  Marble cyst cytic [place] lesion    Mole next to  Red splotch ? Hive left leg   PSYCH: Oriented, good eye contact, no obvious depression anxiety, cognition and judgment appear normal. LN: no cervical axillary inguinal adenopathy No noted deficits in memory, attention, and speech.   Lab Results  Component Value Date   WBC 5.7 05/04/2017   HGB 14.4 05/04/2017   HCT 41.6 05/04/2017   PLT 182.0 05/04/2017   GLUCOSE 127 (H) 05/04/2017   CHOL 203 (H) 03/09/2017   TRIG 209.0 (H) 03/09/2017   HDL 49.00 03/09/2017   LDLDIRECT 137.0 03/09/2017   LDLCALC 133 (H) 03/31/2016   ALT 29 03/09/2017   AST 26 03/09/2017  NA 141 05/04/2017   K 3.4 (L) 05/04/2017   CL 100 05/04/2017    CREATININE 0.90 05/04/2017   BUN 21 05/04/2017   CO2 31 05/04/2017   TSH 3.83 05/04/2017   HGBA1C 7.0 (H) 03/09/2017   MICROALBUR <0.7 05/04/2017    ASSESSMENT AND PLAN:  Discussed the following assessment and plan:  Diabetes mellitus without complication ([place]) - Plan: TSH, T4, free, T3, free, CBC with Differential/Platelet, POCT urinalysis dipstick, Basic metabolic panel, Alpha-Gal Panel, Microalbumin / creatinine urine ratio  Medication management - Plan: TSH, T4, free, T3, free, CBC with Differential/Platelet, POCT urinalysis dipstick, Basic metabolic panel, Alpha-Gal Panel, Microalbumin / creatinine urine ratio  Hypertension associated with diabetes ([place]) - Plan: TSH, T4, free, T3, free, CBC with Differential/Platelet, POCT urinalysis dipstick, Basic metabolic panel, Alpha-Gal Panel, Microalbumin / creatinine urine ratio  Hyperlipidemia, unspecified hyperlipidemia type - Plan: TSH, T4, free, T3, free, CBC with Differential/Platelet, POCT urinalysis dipstick, Basic metabolic panel, Alpha-Gal Panel, Microalbumin / creatinine urine ratio  Morbid obesity (H[facility]) - Plan: Microalbumin / creatinine urine ratio  History of breast cancer - Plan: TSH, T4, free, T3, free, CBC with Differential/Platelet, POCT urinalysis dipstick, Basic metabolic panel, Alpha-Gal Panel, Microalbumin / creatinine urine ratio  Hives of unknown origin - Plan: TSH, T4, free, T3, free, CBC with Differential/Platelet, POCT urinalysis dipstick, Basic metabolic panel, Alpha-Gal Panel, Microalbumin / creatinine urine ratio  Postprandial diarrhea - Plan: TSH, T4, free, T3, free, CBC with Differential/Platelet, POCT urinalysis dipstick, Basic metabolic panel, Alpha-Gal Panel, Microalbumin / creatinine urine ratio  Need for pneumococcal vaccination - Plan: Pneumococcal polysaccharide vaccine 23-valent greater than or equal to 2yo subcutaneous/IM Uncertain cause of her hives appears to have been triggered by antibiotic and  other medicine post op but may have had something like this before is unclear at this time what is triggering it although she did discuss the perhaps the alfalfa which she is allergic to was in some of her supplements. I discussed stopping everything that is not absolutely necessary and adding back as possible.  In regard to her diarrhea as above consider a trial off the metformin and add back lower dose and let us know how this is working consideration of adding colestipol or something like Questran if needed.  Total visit 50mins > 50% spent counseling and coordinating care as indicated in above note and in instructions to patient .   Patient Care Team: Panosh, Standley Brooking, MD as PCP - General [place] Rao Chinita Pester, MD as Consulting Physician (Oncology)  Patient Instructions  Get [place] the name of  The medications    Taken and got hives. manhy causes possible.   Try oof the metformin for  3-4 days  If better add back just one a day to see if  Comes back  Then 2  Per day  .   Let us know what happened.   There are meds for some type of diarrhea after   gb surgery . Follow nodule may be a cyst .   Checking  dm control today   consider seeing allergist if recurring hives itching .   Avoid supplements that could do this .   Statins advised for  Lipid control in diabetes .   ROV in about a month  And calendar the hives and diarrhea    We may add a medication such as colestipol  Or questran  If metformin not cause ing the problem      Standley Brooking. Panosh M.D.

## 2017-05-04 ENCOUNTER — Encounter: Payer: Self-pay | Admitting: Internal Medicine

## 2017-05-04 ENCOUNTER — Ambulatory Visit (INDEPENDENT_AMBULATORY_CARE_PROVIDER_SITE_OTHER): Payer: Medicare Other | Admitting: Internal Medicine

## 2017-05-04 VITALS — BP 136/84 | HR 65 | Temp 97.6°F | Ht 64.0 in | Wt 229.3 lb

## 2017-05-04 DIAGNOSIS — K529 Noninfective gastroenteritis and colitis, unspecified: Secondary | ICD-10-CM | POA: Diagnosis not present

## 2017-05-04 DIAGNOSIS — Z23 Encounter for immunization: Secondary | ICD-10-CM | POA: Diagnosis not present

## 2017-05-04 DIAGNOSIS — Z79899 Other long term (current) drug therapy: Secondary | ICD-10-CM

## 2017-05-04 DIAGNOSIS — E785 Hyperlipidemia, unspecified: Secondary | ICD-10-CM

## 2017-05-04 DIAGNOSIS — L509 Urticaria, unspecified: Secondary | ICD-10-CM | POA: Diagnosis not present

## 2017-05-04 DIAGNOSIS — I10 Essential (primary) hypertension: Secondary | ICD-10-CM

## 2017-05-04 DIAGNOSIS — Z853 Personal history of malignant neoplasm of breast: Secondary | ICD-10-CM

## 2017-05-04 DIAGNOSIS — E1159 Type 2 diabetes mellitus with other circulatory complications: Secondary | ICD-10-CM

## 2017-05-04 DIAGNOSIS — E119 Type 2 diabetes mellitus without complications: Secondary | ICD-10-CM | POA: Diagnosis not present

## 2017-05-04 LAB — POCT URINALYSIS DIP (MANUAL ENTRY)
Bilirubin, UA: NEGATIVE
GLUCOSE UA: NEGATIVE mg/dL
Ketones, POC UA: NEGATIVE mg/dL
Leukocytes, UA: NEGATIVE
NITRITE UA: NEGATIVE
Protein Ur, POC: NEGATIVE mg/dL
RBC UA: NEGATIVE
Spec Grav, UA: 1.02 (ref 1.010–1.025)
UROBILINOGEN UA: 0.2 U/dL
pH, UA: 6 (ref 5.0–8.0)

## 2017-05-04 LAB — CBC WITH DIFFERENTIAL/PLATELET
BASOS ABS: 0 10*3/uL (ref 0.0–0.1)
Basophils Relative: 0.6 % (ref 0.0–3.0)
EOS ABS: 0.2 10*3/uL (ref 0.0–0.7)
Eosinophils Relative: 3.5 % (ref 0.0–5.0)
HEMATOCRIT: 41.6 % (ref 36.0–46.0)
HEMOGLOBIN: 14.4 g/dL (ref 12.0–15.0)
LYMPHS PCT: 26.4 % (ref 12.0–46.0)
Lymphs Abs: 1.5 10*3/uL (ref 0.7–4.0)
MCHC: 34.7 g/dL (ref 30.0–36.0)
MCV: 86.2 fl (ref 78.0–100.0)
Monocytes Absolute: 0.7 10*3/uL (ref 0.1–1.0)
Monocytes Relative: 11.4 % (ref 3.0–12.0)
NEUTROS ABS: 3.3 10*3/uL (ref 1.4–7.7)
Neutrophils Relative %: 58.1 % (ref 43.0–77.0)
PLATELETS: 182 10*3/uL (ref 150.0–400.0)
RBC: 4.83 Mil/uL (ref 3.87–5.11)
RDW: 13.9 % (ref 11.5–15.5)
WBC: 5.7 10*3/uL (ref 4.0–10.5)

## 2017-05-04 LAB — BASIC METABOLIC PANEL
BUN: 21 mg/dL (ref 6–23)
CO2: 31 mEq/L (ref 19–32)
Calcium: 9.8 mg/dL (ref 8.4–10.5)
Chloride: 100 mEq/L (ref 96–112)
Creatinine, Ser: 0.9 mg/dL (ref 0.40–1.20)
GFR: 66.47 mL/min (ref >60.00)
GLUCOSE: 127 mg/dL — AB (ref 70–99)
POTASSIUM: 3.4 meq/L — AB (ref 3.5–5.1)
SODIUM: 141 meq/L (ref 135–145)

## 2017-05-04 LAB — T3, FREE: T3 FREE: 3 pg/mL (ref 2.3–4.2)

## 2017-05-04 LAB — MICROALBUMIN / CREATININE URINE RATIO
CREATININE, U: 67.6 mg/dL
MICROALB/CREAT RATIO: 1 mg/g (ref 0.0–30.0)

## 2017-05-04 LAB — T4, FREE: FREE T4: 0.73 ng/dL (ref 0.60–1.60)

## 2017-05-04 LAB — TSH: TSH: 3.83 u[IU]/mL (ref 0.35–4.50)

## 2017-05-04 NOTE — Patient Instructions (Addendum)
Get [place] the name of  The medications    Taken and got hives. manhy causes possible.   Try oof the metformin for  3-4 days  If better add back just one a day to see if  Comes back  Then 2  Per day  .   Let us know what happened.   There are meds for some type of diarrhea after   gb surgery . Follow nodule may be a cyst .   Checking  dm control today   consider seeing allergist if recurring hives itching .   Avoid supplements that could do this .   Statins advised for  Lipid control in diabetes .   ROV in about a month  And calendar the hives and diarrhea    We may add a medication such as colestipol  Or questran  If metformin not cause ing the problem

## 2017-05-05 ENCOUNTER — Encounter: Payer: Self-pay | Admitting: Internal Medicine

## 2017-05-06 ENCOUNTER — Telehealth: Payer: Self-pay | Admitting: Internal Medicine

## 2017-05-06 NOTE — Telephone Encounter (Signed)
Copied from [place] [id number]. Topic: General - Other >> May 06, 2017  1:31 PM Darl Householder, RMA wrote: Reason for CRM: patient is calling to notify Dr. Regis Bill that she is allergic to cephalexin 500 mg

## 2017-05-06 NOTE — Telephone Encounter (Signed)
Allergy added to list.  Pt states that she is not 100% sure that she had an allergy to this but she was taking this medication when the hives started. Will send to Dr Regis Bill as Juluis Rainier.

## 2017-05-07 LAB — ALPHA-GAL PANEL
Beef IgE: <0.1 kU/L (ref <0.35)
CLASS: 0
CLASS: 0
Class: 0
LAMB/MUTTON IGE: <0.1 kU/L (ref <0.35)

## 2017-05-15 ENCOUNTER — Other Ambulatory Visit: Payer: Self-pay | Admitting: Internal Medicine

## 2017-06-08 ENCOUNTER — Other Ambulatory Visit: Payer: Medicare Other

## 2017-06-09 ENCOUNTER — Other Ambulatory Visit: Payer: Self-pay | Admitting: Internal Medicine

## 2017-06-09 DIAGNOSIS — E875 Hyperkalemia: Secondary | ICD-10-CM

## 2017-06-09 DIAGNOSIS — R739 Hyperglycemia, unspecified: Secondary | ICD-10-CM

## 2017-06-09 NOTE — Progress Notes (Signed)
Chief Complaint  Patient presents with  . Allergic Reaction    Pt had an allergic reaction to something after tanning 06/09/17,  Pt called EMS once she got to work after breaking out in a rash and had a funny feeling in her head, pale skin, sweating and clammy skin. EMS recommended to pt to follow up with PCP.  Pt reports having episodes of recurrent hives/rashes and swimmy headed feelings recently not related to tanning.    HPI: [name] 67 y.o. come in for   Reaction flushing red and itchy and  Dizzy as above  afer using tanning bed     For 7 minutes   Began  After this.   And got rash  And  Sx   Itching and tingly after getting out of the  Tanning bed.   That she has used before )(" so doesn't burn going to beach")  Slept ok   No new meds   . Has sensitive skin . Has had other episodes of hives and feeling bad in past ever since   Had chemo and rx breast cancer ?   rx with steroid  Not sure what is triggering  .   Today is better .   ROS: See pertinent positives and negatives per HPI.  Past Medical History:  Diagnosis Date  . 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 Anxiety   . Arthritis   . 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 Cancer [facility])    right breast cancer  . Cough due to ACE inhibitor 04/28/2011  . Depression   . Ganglion cyst   . GERD (gastroesophageal reflux disease)    occ  . HYPERTENSION 11/22/2007   echo card 2004 nl lv function   . NECK PAIN 11/22/2007  . Other specified disorder of skin 12/25/2008  . PONV (postoperative nausea and vomiting)   . TOBACCO USE, QUIT 12/25/2008    Family History  Problem Relation Age of Onset  . Alzheimer's disease Mother   . Hypertension Mother   . Anxiety disorder Mother   . Diabetes Father   . Alcoholism Father   . Diabetes Other        1st degree relative  . Bladder Cancer Brother     Social History   Socioeconomic History  . Marital status: Divorced    Spouse name: Not on file  .  Number of children: Not on file  . Years of education: Not on file  . Highest education level: Not on file  Occupational History  . Occupation: Theme park manager  Social Needs  . Financial resource strain: Not on file  . Food insecurity:    Worry: Not on file    Inability: Not on file  . Transportation needs:    Medical: Not on file    Non-medical: Not on file  Tobacco Use  . Smoking status: Former Smoker    Packs/day: 1.50    Years: 15.00    Pack years: 22.50    Types: Cigarettes    Last attempt to quit: 03/04/1987    Years since quitting: 30.2  . Smokeless tobacco: Never Used  Substance and Sexual Activity  . Alcohol use: No  . Drug use: No  . Sexual activity: Not on file  Lifestyle  . Physical activity:    Days per week: Not on file    Minutes per session: Not on file  . Stress: Not on file  Relationships  . Social connections:  Talks on phone: Not on file    Gets together: Not on file    Attends religious service: Not on file    Active member of club or organization: Not on file    Attends meetings of clubs or organizations: Not on file    Relationship status: Not on file  Other Topics Concern  . 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    Former tobacco  No tad     Divorced.       Brother temp , son back in for temporarily.  People      Dog     Outpatient Medications Prior to Visit  Medication Sig Dispense Refill  . ALPRAZolam (XANAX) 0.25 MG tablet Take 1 tablet (0.25 mg total) by mouth 3 (three) times daily as needed for anxiety. 30 tablet 0  . diphenhydrAMINE (BENADRYL) 25 MG tablet Take 25 mg by mouth every 6 (six) hours as needed.    Marland Kitchen FLUoxetine (PROZAC) 20 MG capsule TAKE ONE CAPSULE DAILY 30 capsule 1  . metFORMIN (GLUCOPHAGE-XR) 500 MG 24 hr tablet TAKE TWO TABLETS BY MOUTH ONCE DAILY 180 tablet 3  . metoprolol tartrate (LOPRESSOR) 50 MG tablet Take 0.5  tablets (25 mg total) by mouth 2 (two) times daily. 90 tablet 1  . triamterene-hydrochlorothiazide (MAXZIDE-25) 37.5-25 MG tablet TAKE ONE TABLET BY MOUTH ONCE DAILY 90 tablet 1   No facility-administered medications prior to visit.      EXAM:  BP 118/82 (BP Location: Right Arm, Patient Position: Sitting, Cuff Size: Normal)   Pulse 66   Temp 97.6 F (36.4 C) (Oral)   Wt 229 lb 6.4 oz (104.1 kg)   BMI 39.38 kg/m   Body mass index is 39.38 kg/m.  GENERAL: vitals reviewed and listed above, alert, oriented, appears well hydrated and in no acute distress HEENT: atraumatic, conjunctiva  clear, no obvious abnormalities on inspection of external nose and ears  NECK: no obvious masses on inspection palpation  LUNGS: clear to auscultation bilaterally, no wheezes, rales or rhonchi, good air movement CV: HRRR, no clubbing cyanosis or  peripheral edema nl cap refill  MS: moves all extremities without noticeable focal  abnormality PSYCH: pleasant and cooperative, no obvious depression or anxiety Skin  No acute rash  Some sun changes  Ant chest and arms  No burn  Lab Results  Component Value Date   WBC 5.7 05/04/2017   HGB 14.4 05/04/2017   HCT 41.6 05/04/2017   PLT 182.0 05/04/2017   GLUCOSE 109 (H) 06/10/2017   CHOL 203 (H) 03/09/2017   TRIG 209.0 (H) 03/09/2017   HDL 49.00 03/09/2017   LDLDIRECT 137.0 03/09/2017   LDLCALC 133 (H) 03/31/2016   ALT 29 03/09/2017   AST 26 03/09/2017   NA 141 06/10/2017   K 3.7 06/10/2017   CL 101 06/10/2017   CREATININE 0.85 06/10/2017   BUN 22 06/10/2017   CO2 31 06/10/2017   TSH 3.83 05/04/2017   HGBA1C 6.7 (H) 06/10/2017   MICROALBUR <0.7 05/04/2017   BP Readings from Last 3 Encounters:  06/10/17 118/82  05/04/17 136/84  03/09/17 (!) 98/56    ASSESSMENT AND PLAN:  Discussed the following assessment and plan:  Allergic reaction, initial encounter - Plan: Hemoglobin A1c, Ambulatory referral to Allergy, Hemoglobin  A1c  Photosensitivity  History of urticaria - Plan: Ambulatory referral to  Allergy  New onset type 2 diabetes mellitus ([place]) - Plan: Hemoglobin A1c, Hemoglobin A1c  Serum potassium elevated - Plan: Basic Metabolic Panel  Elevated serum glucose - Plan: Basic Metabolic Panel This episode could be a photosensitivity reaction  Advised never to use tanning bed for many health reasons  .   Disc diuretic as a possible set up . But pt feels does better on this med and never had a problem in the past so for now no change in meds     Advise  Allergy consult for next step .   Carry benadryl with her  liquogel  If needed   Labs due today for potassium a nd a1c level  Total visit 74mins > 50% spent counseling and coordinating care as indicated in above note and in instructions to patient .  -Patient advised to return or notify health care team  if  new concerns arise.  Patient Instructions   Do not use tanning beds   .   There is not evidence that it helps you .  And  You may have ahd a photosensitive reaction    .   Form uv   Exposures .  Even if other  meds   May be   Part of the problem   consider   Stopping the  maxide  And try something else for  BP control   consider seeing   Allergist  Dermatitis.  To help [place] out.   You will be notified  About an appt.   Keep   Benadryl   On you for now  25 mg -50 mg . If needed  For allergic itching and hives.       Standley Brooking. Panosh M.D.

## 2017-06-10 ENCOUNTER — Other Ambulatory Visit: Payer: Medicare Other

## 2017-06-10 ENCOUNTER — Ambulatory Visit (INDEPENDENT_AMBULATORY_CARE_PROVIDER_SITE_OTHER): Payer: Medicare Other | Admitting: Internal Medicine

## 2017-06-10 ENCOUNTER — Encounter: Payer: Self-pay | Admitting: Internal Medicine

## 2017-06-10 VITALS — BP 118/82 | HR 66 | Temp 97.6°F | Wt 229.4 lb

## 2017-06-10 DIAGNOSIS — L568 Other specified acute skin changes due to ultraviolet radiation: Secondary | ICD-10-CM | POA: Diagnosis not present

## 2017-06-10 DIAGNOSIS — Z872 Personal history of diseases of the skin and subcutaneous tissue: Secondary | ICD-10-CM

## 2017-06-10 DIAGNOSIS — E119 Type 2 diabetes mellitus without complications: Secondary | ICD-10-CM | POA: Diagnosis not present

## 2017-06-10 DIAGNOSIS — E875 Hyperkalemia: Secondary | ICD-10-CM

## 2017-06-10 DIAGNOSIS — R739 Hyperglycemia, unspecified: Secondary | ICD-10-CM | POA: Diagnosis not present

## 2017-06-10 DIAGNOSIS — T7840XA Allergy, unspecified, initial encounter: Secondary | ICD-10-CM

## 2017-06-10 LAB — BASIC METABOLIC PANEL
BUN: 22 mg/dL (ref 6–23)
CO2: 31 mEq/L (ref 19–32)
CREATININE: 0.85 mg/dL (ref 0.40–1.20)
Calcium: 9.6 mg/dL (ref 8.4–10.5)
Chloride: 101 mEq/L (ref 96–112)
GFR: 70.98 mL/min (ref >60.00)
Glucose, Bld: 109 mg/dL — ABNORMAL HIGH (ref 70–99)
Potassium: 3.7 mEq/L (ref 3.5–5.1)
SODIUM: 141 meq/L (ref 135–145)

## 2017-06-10 LAB — HEMOGLOBIN A1C: Hgb A1c MFr Bld: 6.7 % — ABNORMAL HIGH (ref 4.6–6.5)

## 2017-06-10 NOTE — Patient Instructions (Addendum)
  Do not use tanning beds   .   There is not evidence that it helps you .  And  You may have ahd a photosensitive reaction    .   Form uv   Exposures .  Even if other  meds   May be   Part of the problem   consider   Stopping the  maxide  And try something else for  BP control   consider seeing   Allergist  Dermatitis.  To help [place] out.   You will be notified  About an appt.   Keep   Benadryl   On you for now  25 mg -50 mg . If needed  For allergic itching and hives.

## 2017-06-15 DIAGNOSIS — S82842D Displaced bimalleolar fracture of left lower leg, subsequent encounter for closed fracture with routine healing: Secondary | ICD-10-CM | POA: Diagnosis not present

## 2017-06-24 DIAGNOSIS — L508 Other urticaria: Secondary | ICD-10-CM | POA: Diagnosis not present

## 2017-06-24 DIAGNOSIS — R21 Rash and other nonspecific skin eruption: Secondary | ICD-10-CM | POA: Diagnosis not present

## 2017-06-24 DIAGNOSIS — L299 Pruritus, unspecified: Secondary | ICD-10-CM | POA: Diagnosis not present

## 2017-07-10 ENCOUNTER — Other Ambulatory Visit: Payer: Self-pay | Admitting: Internal Medicine

## 2017-07-13 ENCOUNTER — Ambulatory Visit (INDEPENDENT_AMBULATORY_CARE_PROVIDER_SITE_OTHER): Payer: Medicare Other | Admitting: Allergy and Immunology

## 2017-07-13 ENCOUNTER — Encounter: Payer: Self-pay | Admitting: Allergy and Immunology

## 2017-07-13 VITALS — BP 136/80 | HR 86 | Temp 98.8°F | Resp 24 | Ht 64.0 in | Wt 232.2 lb

## 2017-07-13 DIAGNOSIS — T887XXA Unspecified adverse effect of drug or medicament, initial encounter: Secondary | ICD-10-CM | POA: Diagnosis not present

## 2017-07-13 DIAGNOSIS — R232 Flushing: Secondary | ICD-10-CM

## 2017-07-13 DIAGNOSIS — L500 Allergic urticaria: Secondary | ICD-10-CM | POA: Diagnosis not present

## 2017-07-13 DIAGNOSIS — L299 Pruritus, unspecified: Secondary | ICD-10-CM | POA: Diagnosis not present

## 2017-07-13 MED ORDER — RANITIDINE HCL 150 MG PO TABS: 150.0000 mg | ORAL_TABLET | Freq: Two times a day (BID) | ORAL | 5 refills | Status: DC

## 2017-07-13 MED ORDER — MONTELUKAST SODIUM 10 MG PO TABS: 10.0000 mg | ORAL_TABLET | Freq: Every day | ORAL | 5 refills | Status: DC

## 2017-07-13 NOTE — Progress Notes (Signed)
Dear Dr. Regis Bill,  Thank you for referring [name] to the [facility] on 07/13/2017.   Below is a summation of this patient's evaluation and recommendations.  Thank you for your referral. I will keep you informed about this patient's response to treatment.   If you have any questions please do not hesitate to contact me.   Sincerely,  Jiles Prows, MD Allergy / Immunology [place]   ______________________________________________________________________    NEW PATIENT NOTE  Referring Provider: Burnis Medin, MD Primary Provider: Burnis Medin, MD Date of office visit: 07/13/2017    Subjective:   Chief Complaint:  [name] (DOB: 1950/12/19) is a 67 y.o. female who presents to the clinic on 07/13/2017 with a chief complaint of Allergic Reaction .     HPI: [name] presents to this clinic in evaluation of a multitude of issues that have developed over the course of the past 8 months or so.  Apparently in November she developed facial itching with some redness of her skin for which she went to the urgent care center and was treated with prednisone. Her skin issue appeared to respond quite well to the administration and she was not bothered with this issue thereafter. There was no obvious provoking factor giving rise to this issue.  In January she had fracture of her ankle requiring surgery and she was given a combination of tramadol and cephalexin and within days developed diffuse urticaria once again requiring evaluation and treatment at the urgent care center with systemic steroids and this issue resolved within days.  In March she developed a intense itching and redness of her scalp and then developed weakness and nausea what she thought was throat closing requiring EMT evaluation.  Apparently her episode lasted 1 hour and spontaneously resolved.  There was  no obvious provoking factor giving rise to this issue.  2 weeks ago she developed intense scalp itching and a "funny feeling" on top of her head and ended up in the urgent care center within several hours and used another 10 days of systemic steroids and all this issue has resolved.  She started [name] around that same point in time and since she discontinued her [name] 3 days ago she has developed some new symptoms manifested as return of scalp itching and congested nose and clear rhinorrhea and some slight sneezing.  She felt bad last night although she feels a little bit better today.  She has not had any obvious fever or chills or other types of respiratory tract symptoms with this event.  Overall she is not a particularly atopic individual.  She does use supplements on a regular basis and has done so for 25 years.    She has had blood test performed in investigation of this issue which have not identified any significant abnormality.  Past Medical History:  Diagnosis Date  . 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 Anxiety   . Arthritis   . 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 Cancer [facility])    right breast cancer  . Cough due to ACE inhibitor 04/28/2011  . Depression   . Ganglion cyst   . GERD (gastroesophageal reflux disease)    occ  . HYPERTENSION 11/22/2007   echo card 2004 nl lv function   . NECK PAIN 11/22/2007  . Other specified disorder of skin 12/25/2008  . PONV (postoperative  nausea and vomiting)   . TOBACCO USE, QUIT 12/25/2008    Past Surgical History:  Procedure Laterality Date  . ABDOMINAL HYSTERECTOMY  1989  . APPENDECTOMY    . BREAST RECONSTRUCTION Left 12/01/2013   dr Harlow Mares  . BREAST RECONSTRUCTION Left 12/01/2013   Procedure:  BREAST RECONSTRUCTION;  Surgeon: Crissie Reese, MD;  Location: [facility];  Service: Plastics;  Laterality: Left;  . BREAST RECONSTRUCTION WITH PLACEMENT OF TISSUE EXPANDER AND FLEX HD (ACELLULAR HYDRATED DERMIS) Bilateral  03/10/2013   Procedure: PLACEMENT OF BILATERAL TISSUE EXPANDER WITH POSSIBLE FLEX HD (ACELLULAR HYDRATED DERMIS) FOR BREAST RECONSTRUCTION;  Surgeon: Crissie Reese, MD;  Location: [facility];  Service: Plastics;  Laterality: Bilateral;  . CHOLECYSTECTOMY  2/15  . COLONOSCOPY    . LAPAROSCOPIC APPENDECTOMY    . MASTECTOMY COMPLETE / SIMPLE W/ SENTINEL NODE BIOPSY Bilateral   . MASTECTOMY W/ SENTINEL NODE BIOPSY Bilateral 03/10/2013   Procedure: BILATERAL MASTECTOMY WITH RIGHT SENTINEL LYMPH NODE BIOPSY FOLLOWED BY RECONSTRUCTION;  Surgeon: Merrie Roof, MD;  Location: [facility];  Service: General;  Laterality: Bilateral;  . ORIF ANKLE FRACTURE Left 03/19/2017   [facility]  [place]  . PORT-A-CATH REMOVAL Left 12/01/2014   Procedure: REMOVAL PORT-A-CATH;  Surgeon: Autumn Messing III, MD;  Location: [facility];  Service: General;  Laterality: Left;  . PORTACATH PLACEMENT Left 03/10/2013   Procedure: INSERTION PORT-A-CATH;  Surgeon: Merrie Roof, MD;  Location: [facility];  Service: General;  Laterality: Left;  . REMOVAL OF TISSUE EXPANDER AND PLACEMENT OF IMPLANT Left 05/05/2013   Procedure: REMOVAL OF LEFT BREAST TISSUE EXPANDER / incision and drainage of ceroma;  Surgeon: Crissie Reese, MD;  Location: [facility];  Service: Plastics;  Laterality: Left;  . SHOULDER SURGERY     right shoulder scope, open RCR, biceps tendonesis 12/17/15  . TISSUE EXPANDER PLACEMENT Left 12/01/2013   Procedure: TISSUE EXPANDER FOR DELAYED LEFT BREAST RECONSTRUCTION;  Surgeon: Crissie Reese, MD;  Location: [facility];  Service: Plastics;  Laterality: Left;  . WRIST SURGERY  2001    Allergies as of 07/13/2017      Reactions   Ace Inhibitors Cough   Cephalexin Hives   Cephalosporins Hives   Possible allergy -- hives    Codeine Itching, Nausea Only   Tramadol Hives      Medication List      ALPRAZolam 0.25 MG tablet Commonly known as:  XANAX Take 1 tablet (0.25 mg total) by mouth 3 (three) times daily as needed for anxiety.     cetirizine 10 MG tablet Commonly known as:  [name] Take 10 mg by mouth 2 (two) times daily.   FLUoxetine 20 MG capsule Commonly known as:  PROZAC TAKE ONE CAPSULE DAILY   metFORMIN 500 MG 24 hr tablet Commonly known as:  GLUCOPHAGE-XR TAKE TWO TABLETS BY MOUTH ONCE DAILY   metoprolol tartrate 50 MG tablet Commonly known as:  LOPRESSOR Take 0.5 tablets (25 mg total) by mouth 2 (two) times daily.   multivitamin tablet Take 1 tablet by mouth daily. [place] vitamin packet   triamterene-hydrochlorothiazide 37.5-25 MG tablet Commonly known as:  MAXZIDE-25 TAKE 1 TABLET DAILY       Review of systems negative except as noted in HPI / PMHx or noted below:  Review of Systems  Constitutional: Negative.   HENT: Negative.   Eyes: Negative.   Respiratory: Negative.   Cardiovascular: Negative.   Gastrointestinal: Negative.   Genitourinary: Negative.   Musculoskeletal: Negative.   Skin: Negative.  Neurological: Negative.   Endo/Heme/Allergies: Negative.   Psychiatric/Behavioral: Negative.     Family History  Problem Relation Age of Onset  . Alzheimer's disease Mother   . Hypertension Mother   . Anxiety disorder Mother   . Allergic rhinitis Mother   . Diabetes Father   . Alcoholism Father   . Cancer Brother        Cancer at base of tongue  . Diabetes Other        1st degree relative  . Bladder Cancer Brother     Social History   Socioeconomic History  . Marital status: Divorced    Spouse name: Not on file  . Number of children: Not on file  . Years of education: Not on file  . Highest education level: Not on file  Occupational History  . Occupation: Theme park manager  Social Needs  . Financial resource strain: Not on file  . Food insecurity:    Worry: Not on file    Inability: Not on file  . Transportation needs:    Medical: Not on file    Non-medical: Not on file  Tobacco Use  . Smoking status: Former Smoker    Packs/day: 1.50    Years: 15.00    Pack  years: 22.50    Types: Cigarettes    Last attempt to quit: 03/04/1987    Years since quitting: 30.3  . Smokeless tobacco: Never Used  Substance and Sexual Activity  . Alcohol use: No  . Drug use: No  . Sexual activity: Not on file  Lifestyle  . Physical activity:    Days per week: Not on file    Minutes per session: Not on file  . Stress: Not on file  Relationships  . Social connections:    Talks on phone: Not on file    Gets together: Not on file    Attends religious service: Not on file    Active member of club or organization: Not on file    Attends meetings of clubs or organizations: Not on file    Relationship status: Not on file  . Intimate partner violence:    Fear of current or ex partner: Not on file    Emotionally abused: Not on file    Physically abused: Not on file    Forced sexual activity: Not on file  Other Topics Concern  . 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    Former tobacco  No tad     Divorced.       Brother temp , son back in for temporarily.  People      Dog     Environmental and Social history  Lives in a apartment with a dry environment, a dog located inside the household, no carpet in the bedroom, no plastic on the bed, no plastic on the pillow, and no smokers located inside the household.  She is a hairstylist.  Objective:   Vitals:   07/13/17 0953  BP: 136/80  Pulse: 86  Resp: (!) 24  Temp: 98.8 F (37.1 C)   Height: 5\' 4"  (162.6 cm) Weight: 232 lb 3.2 oz (105.3 kg)  Physical Exam  HENT:  Head: Normocephalic. Head is without right periorbital erythema and without left periorbital erythema.  Right Ear: Tympanic membrane, external ear and ear canal normal.  Left Ear: Tympanic membrane, external ear  and ear canal normal.  Nose: Nose normal. No mucosal edema or rhinorrhea.  Mouth/Throat: Oropharynx is clear and moist and mucous  membranes are normal. No oropharyngeal exudate.  Eyes: Pupils are equal, round, and reactive to light. Conjunctivae and lids are normal.  Neck: Trachea normal. No tracheal deviation present. No thyromegaly present.  Cardiovascular: Normal rate, regular rhythm, S1 normal, S2 normal and normal heart sounds.  No murmur heard. Pulmonary/Chest: Effort normal. No stridor. No respiratory distress. She has no wheezes. She has no rales. She exhibits no tenderness.  Abdominal: Soft. She exhibits no distension and no mass. There is no hepatosplenomegaly. There is no tenderness. There is no rebound and no guarding.  Musculoskeletal: She exhibits no edema or tenderness.  Lymphadenopathy:       Head (right side): No tonsillar adenopathy present.       Head (left side): No tonsillar adenopathy present.    She has no cervical adenopathy.    She has no axillary adenopathy.  Neurological: She is alert.  Skin: No rash noted. She is not diaphoretic. No erythema. No pallor. Nails show no clubbing.    Diagnostics: Allergy skin tests were performed.  She did not demonstrate any hypersensitivity to a screening panel of aeroallergens or foods.  Review blood test dated 04 May 2017 identified WBC 5.7, absolute eosinophil 200, absolute lymphocyte 1500, hemoglobin 14.4, platelet 182, normal TSH, normal T4, negative alpha gal panel.  Results of blood test dated 09 March 2017 identified normal hepatic and renal function and negative hepatitis C antibody.  Assessment and Plan:    1. Allergic urticaria   2. Pruritic disorder   3. Flushing reaction   4. Adverse drug event     1.  Allergen avoidance measures?  Eliminate all supplement use.  2.  Every day utilize the following medications:   A.  Cetirizine 10 mg tablet 2 times a day  B.  Ranitidine 150 mg tablet 2 times a day  C.  Montelukast 10 mg tablet 1 time a day  3.  For recent viral upper respiratory tract infection can use the following:   A. Nasal  saline multiple times per day  B. Mucinex  4.  Further evaluation?  Flushing disorder?  Mast cell disorder?  5.  Return to clinic in 4 weeks or earlier if problem  Adelaine may have some type of syndrome tied up with a overactive immune system and for now she will utilize a combination of therapy in an attempt to minimize that activity.  She also appears to have a viral respiratory tract infection recently and she can treat that conservatively as noted above.  As well, her episode of urticaria around the time of her surgery was most likely secondary to the use of cephalexin and this should be noted as an drug allergy on her medical history.  At this point I am going to hold off on any further evaluation looking for sources of immunological hyperactivity so we can get a better idea of just how active her immune system is over the course of the next few months.  If she does develop issues with what sounds like possible flushing or mast cell activity then we may need to have her undergo further evaluation.  I will see her back in this clinic in 4 weeks or she will contact me earlier should the be a problem.  Jiles Prows, MD Allergy / Immunology [facility]

## 2017-07-13 NOTE — Patient Instructions (Addendum)
  1.  Allergen avoidance measures?  Eliminate all supplement use.  2.  Every day utilize the following medications:   A.  Cetirizine 10 mg tablet 2 times a day  B.  Ranitidine 150 mg tablet 2 times a day  C.  Montelukast 10 mg tablet 1 time a day  3.  For recent viral upper respiratory tract infection can use the following:   A. Nasal saline multiple times per day  B. Mucinex  4.  Further evaluation?  Flushing disorder?  Mast cell disorder?  5.  Return to clinic in 4 weeks or earlier if problem

## 2017-07-14 ENCOUNTER — Encounter: Payer: Self-pay | Admitting: Allergy and Immunology

## 2017-07-19 ENCOUNTER — Other Ambulatory Visit: Payer: Self-pay | Admitting: Internal Medicine

## 2017-07-29 DIAGNOSIS — C50911 Malignant neoplasm of unspecified site of right female breast: Secondary | ICD-10-CM | POA: Diagnosis not present

## 2017-08-10 ENCOUNTER — Encounter: Payer: Self-pay | Admitting: Allergy and Immunology

## 2017-08-10 ENCOUNTER — Ambulatory Visit (INDEPENDENT_AMBULATORY_CARE_PROVIDER_SITE_OTHER): Payer: Medicare Other | Admitting: Allergy and Immunology

## 2017-08-10 VITALS — BP 136/78 | HR 72 | Resp 22

## 2017-08-10 DIAGNOSIS — L299 Pruritus, unspecified: Secondary | ICD-10-CM

## 2017-08-10 DIAGNOSIS — R232 Flushing: Secondary | ICD-10-CM

## 2017-08-10 DIAGNOSIS — L500 Allergic urticaria: Secondary | ICD-10-CM

## 2017-08-10 MED ORDER — LORATADINE 10 MG PO TABS: 10.0000 mg | ORAL_TABLET | Freq: Every day | ORAL | 5 refills | Status: DC

## 2017-08-10 NOTE — Progress Notes (Signed)
Follow-up Note  Referring Provider: Burnis Medin, MD Primary Provider: Burnis Medin, MD Date of Office Visit: 08/10/2017  Subjective:   [name] (DOB: 12/20/1950) is a 67 y.o. female who returns to the [facility] [place] on 08/10/2017 in re-evaluation of the following:  HPI: Livy presents to this clinic in reevaluation of her allergic reaction with urticaria and possible angioedema and flushing that may have been secondary to a drug reaction and a pruritic disorder involving her scalp.  Her last visit to this clinic was her initial evaluation of 13 Jul 2017.  Overall she is doing much better.  She is now using apple cider vinegar rinse in her hair after shampoo every day and this has resulted in less itchiness of her scalp.  Cetirizine gave rise to sedation and she is not using this medication at this point.  She does continue on ranitidine and montelukast.  She has not had any urticaria or diffuse pruritus or any flushing.  Allergies as of 08/10/2017      Reactions   Ace Inhibitors Cough   Cephalexin Hives   Cephalosporins Hives   Possible allergy -- hives    Codeine Itching, Nausea Only   Tramadol Hives      Medication List      ALPRAZolam 0.25 MG tablet Commonly known as:  XANAX Take 1 tablet (0.25 mg total) by mouth 3 (three) times daily as needed for anxiety.   cetirizine 10 MG tablet Commonly known as:  ZYRTEC Take 10 mg by mouth 2 (two) times daily.   FLUoxetine 20 MG capsule Commonly known as:  PROZAC TAKE ONE CAPSULE DAILY   loratadine 10 MG tablet Commonly known as:  CLARITIN Take 1 tablet (10 mg total) by mouth daily.   metFORMIN 500 MG 24 hr tablet Commonly known as:  GLUCOPHAGE-XR TAKE TWO TABLETS BY MOUTH ONCE DAILY   metoprolol tartrate 50 MG tablet Commonly known as:  LOPRESSOR Take 0.5 tablets (25 mg total) by mouth 2 (two) times daily.   montelukast 10 MG tablet Commonly known as:  SINGULAIR Take 1 tablet (10  mg total) by mouth at bedtime.   multivitamin tablet Take 1 tablet by mouth daily. [place] vitamin packet   ranitidine 150 MG tablet Commonly known as:  ZANTAC Take 1 tablet (150 mg total) by mouth 2 (two) times daily.   triamterene-hydrochlorothiazide 37.5-25 MG tablet Commonly known as:  MAXZIDE-25 TAKE 1 TABLET DAILY       Past Medical History:  Diagnosis Date  . 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 Anxiety   . Arthritis   . 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 Cancer [facility])    right breast cancer  . Cough due to ACE inhibitor 04/28/2011  . Depression   . Ganglion cyst   . GERD (gastroesophageal reflux disease)    occ  . HYPERTENSION 11/22/2007   echo card 2004 nl lv function   . NECK PAIN 11/22/2007  . Other specified disorder of skin 12/25/2008  . PONV (postoperative nausea and vomiting)   . TOBACCO USE, QUIT 12/25/2008  . Urticaria     Past Surgical History:  Procedure Laterality Date  . ABDOMINAL HYSTERECTOMY  1989  . APPENDECTOMY    . BREAST RECONSTRUCTION Left 12/01/2013   dr Harlow Mares  . BREAST RECONSTRUCTION Left 12/01/2013   Procedure:  BREAST RECONSTRUCTION;  Surgeon: Crissie Reese, MD;  Location: [place];  Service: Plastics;  Laterality: Left;  .  BREAST RECONSTRUCTION WITH PLACEMENT OF TISSUE EXPANDER AND FLEX HD (ACELLULAR HYDRATED DERMIS) Bilateral 03/10/2013   Procedure: PLACEMENT OF BILATERAL TISSUE EXPANDER WITH POSSIBLE FLEX HD (ACELLULAR HYDRATED DERMIS) FOR BREAST RECONSTRUCTION;  Surgeon: Crissie Reese, MD;  Location: [place];  Service: Plastics;  Laterality: Bilateral;  . CHOLECYSTECTOMY  2/15  . COLONOSCOPY    . LAPAROSCOPIC APPENDECTOMY    . MASTECTOMY COMPLETE / SIMPLE W/ SENTINEL NODE BIOPSY Bilateral   . MASTECTOMY W/ SENTINEL NODE BIOPSY Bilateral 03/10/2013   Procedure: BILATERAL MASTECTOMY WITH RIGHT SENTINEL LYMPH NODE BIOPSY FOLLOWED BY RECONSTRUCTION;  Surgeon: Merrie Roof, MD;  Location: [place];  Service: General;   Laterality: Bilateral;  . ORIF ANKLE FRACTURE Left 03/19/2017   [place]  [place]  . PORT-A-CATH REMOVAL Left 12/01/2014   Procedure: REMOVAL PORT-A-CATH;  Surgeon: Autumn Messing III, MD;  Location: [place];  Service: General;  Laterality: Left;  . PORTACATH PLACEMENT Left 03/10/2013   Procedure: INSERTION PORT-A-CATH;  Surgeon: Merrie Roof, MD;  Location: [place];  Service: General;  Laterality: Left;  . REMOVAL OF TISSUE EXPANDER AND PLACEMENT OF IMPLANT Left 05/05/2013   Procedure: REMOVAL OF LEFT BREAST TISSUE EXPANDER / incision and drainage of ceroma;  Surgeon: Crissie Reese, MD;  Location: [place];  Service: Plastics;  Laterality: Left;  . SHOULDER SURGERY     right shoulder scope, open RCR, biceps tendonesis 12/17/15  . TISSUE EXPANDER PLACEMENT Left 12/01/2013   Procedure: TISSUE EXPANDER FOR DELAYED LEFT BREAST RECONSTRUCTION;  Surgeon: Crissie Reese, MD;  Location: [place];  Service: Plastics;  Laterality: Left;  . WRIST SURGERY  2001    Review of systems negative except as noted in HPI / PMHx or noted below:  Review of Systems  Constitutional: Negative.   HENT: Negative.   Eyes: Negative.   Respiratory: Negative.   Cardiovascular: Negative.   Gastrointestinal: Negative.   Genitourinary: Negative.   Musculoskeletal: Negative.   Skin: Negative.   Neurological: Negative.   Endo/Heme/Allergies: Negative.   Psychiatric/Behavioral: Negative.      Objective:   Vitals:   08/10/17 1742  BP: 136/78  Pulse: 72  Resp: (!) 22          Physical Exam  HENT:  Head: Normocephalic.  Right Ear: Tympanic membrane, external ear and ear canal normal.  Left Ear: Tympanic membrane, external ear and ear canal normal.  Nose: Nose normal. No mucosal edema or rhinorrhea.  Mouth/Throat: Uvula is midline, oropharynx is clear and moist and mucous membranes are normal. No oropharyngeal exudate.  Eyes: Conjunctivae are normal.  Neck: Trachea normal. No tracheal tenderness present.  No tracheal deviation present. No thyromegaly present.  Cardiovascular: Normal rate, regular rhythm, S1 normal, S2 normal and normal heart sounds.  No murmur heard. Pulmonary/Chest: Breath sounds normal. No stridor. No respiratory distress. She has no wheezes. She has no rales.  Musculoskeletal: She exhibits no edema.  Lymphadenopathy:       Head (right side): No tonsillar adenopathy present.       Head (left side): No tonsillar adenopathy present.    She has no cervical adenopathy.  Neurological: She is alert.  Skin: No rash noted. She is not diaphoretic. No erythema. Nails show no clubbing.    Diagnostics: none   Assessment and Plan:   1. Allergic urticaria   2. Pruritic disorder   3. Flushing reaction     1.  Remain off all supplement use for now.  2.  Every day utilize  the following medications:   A.  CHANGE cetirizine to loratadine 10mg  1 time per day  B.  DECREASE Ranitidine 150 mg tablet 1 time a day  C.  Montelukast 10 mg tablet 1 time a day  3. Return to clinic in 8 weeks. Taper?   Vonette appears to be doing quite well and I would like for her to remain on antihistamines and a leukotriene modifier at least for the next 8 weeks which would be a total of 12 weeks of therapy.  We need to change her cetirizine to loratadine as cetirizine appears to be giving rise to some sedation.  As well, I would like for her to remain off her supplements at this point.  She can start to reintroduce her supplements at the end of this 8-week interval.  There is no need to perform any additional evaluation at this point in time for her previous reaction as she appears to be doing well.  I will see her back in this clinic in 8 weeks.  [name], MD Allergy / Immunology [place]

## 2017-08-10 NOTE — Patient Instructions (Addendum)
  1.  Remain off all supplement use for now.  2.  Every day utilize the following medications:   A.  CHANGE cetirizine to loratadine 10mg  1 time per day  B.  DECREASE Ranitidine 150 mg tablet 1 time a day  C.  Montelukast 10 mg tablet 1 time a day  3. Return to clinic in 8 weeks. Taper?

## 2017-08-11 ENCOUNTER — Encounter: Payer: Self-pay | Admitting: Allergy and Immunology

## 2017-09-20 ENCOUNTER — Other Ambulatory Visit: Payer: Self-pay | Admitting: Internal Medicine

## 2017-10-05 ENCOUNTER — Encounter: Payer: Self-pay | Admitting: Allergy and Immunology

## 2017-10-05 ENCOUNTER — Ambulatory Visit (INDEPENDENT_AMBULATORY_CARE_PROVIDER_SITE_OTHER): Payer: Medicare Other | Admitting: Allergy and Immunology

## 2017-10-05 VITALS — BP 136/76 | HR 72 | Resp 18

## 2017-10-05 DIAGNOSIS — L299 Pruritus, unspecified: Secondary | ICD-10-CM | POA: Diagnosis not present

## 2017-10-05 DIAGNOSIS — L500 Allergic urticaria: Secondary | ICD-10-CM | POA: Diagnosis not present

## 2017-10-05 NOTE — Progress Notes (Signed)
Follow-up Note  Referring Provider: Burnis Medin, MD Primary Provider: Burnis Medin, MD Date of Office Visit: 10/05/2017  Subjective:   [name] (DOB: 01-24-1951) is a 67 y.o. female who returns to the [facility] [place] on 10/05/2017 in re-evaluation of the following:  HPI: [name] presents to this clinic in reevaluation of her isolated allergic reaction with urticaria and angioedema and flushing and her pruritic disorder involving predominantly her scalp.  Her last visit to this clinic was 10 August 2017.  During her last visit we decreased her medications.  She has done relatively well.  She has only had 2 episodes of head itching that required her to use a hydroxyzine tablet.  She has been consistently using loratadine and ranitidine and montelukast and has not had any allergic reactions.  [name] would like to restart her supplements.  Allergies as of 10/05/2017      Reactions   Ace Inhibitors Cough   Cephalexin Hives   Cephalosporins Hives   Possible allergy -- hives    Codeine Itching, Nausea Only   Tramadol Hives      Medication List      ALPRAZolam 0.25 MG tablet Commonly known as:  XANAX Take 1 tablet (0.25 mg total) by mouth 3 (three) times daily as needed for anxiety.   FLUoxetine 20 MG capsule Commonly known as:  PROZAC TAKE ONE CAPSULE DAILY   loratadine 10 MG tablet Commonly known as:  CLARITIN Take 1 tablet (10 mg total) by mouth daily.   metFORMIN 500 MG 24 hr tablet Commonly known as:  GLUCOPHAGE-XR TAKE TWO TABLETS BY MOUTH ONCE DAILY   metoprolol tartrate 50 MG tablet Commonly known as:  LOPRESSOR Take 0.5 tablets (25 mg total) by mouth 2 (two) times daily.   montelukast 10 MG tablet Commonly known as:  SINGULAIR Take 1 tablet (10 mg total) by mouth at bedtime.   ranitidine 150 MG tablet Commonly known as:  ZANTAC Take 1 tablet (150 mg total) by mouth 2 (two) times daily.   triamterene-hydrochlorothiazide  37.5-25 MG tablet Commonly known as:  MAXZIDE-25 TAKE 1 TABLET DAILY       Past Medical History:  Diagnosis Date  . 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 Anxiety   . Arthritis   . 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 Cancer [facility])    right breast cancer  . Cough due to ACE inhibitor 04/28/2011  . Depression   . Ganglion cyst   . GERD (gastroesophageal reflux disease)    occ  . HYPERTENSION 11/22/2007   echo card 2004 nl lv function   . NECK PAIN 11/22/2007  . Other specified disorder of skin 12/25/2008  . PONV (postoperative nausea and vomiting)   . TOBACCO USE, QUIT 12/25/2008  . Urticaria     Past Surgical History:  Procedure Laterality Date  . ABDOMINAL HYSTERECTOMY  1989  . APPENDECTOMY    . BREAST RECONSTRUCTION Left 12/01/2013   dr Harlow Mares  . BREAST RECONSTRUCTION Left 12/01/2013   Procedure:  BREAST RECONSTRUCTION;  Surgeon: Crissie Reese, MD;  Location: [place];  Service: Plastics;  Laterality: Left;  . BREAST RECONSTRUCTION WITH PLACEMENT OF TISSUE EXPANDER AND FLEX HD (ACELLULAR HYDRATED DERMIS) Bilateral 03/10/2013   Procedure: PLACEMENT OF BILATERAL TISSUE EXPANDER WITH POSSIBLE FLEX HD (ACELLULAR HYDRATED DERMIS) FOR BREAST RECONSTRUCTION;  Surgeon: Crissie Reese, MD;  Location: [place];  Service: Plastics;  Laterality: Bilateral;  . CHOLECYSTECTOMY  2/15  .  COLONOSCOPY    . LAPAROSCOPIC APPENDECTOMY    . MASTECTOMY COMPLETE / SIMPLE W/ SENTINEL NODE BIOPSY Bilateral   . MASTECTOMY W/ SENTINEL NODE BIOPSY Bilateral 03/10/2013   Procedure: BILATERAL MASTECTOMY WITH RIGHT SENTINEL LYMPH NODE BIOPSY FOLLOWED BY RECONSTRUCTION;  Surgeon: Merrie Roof, MD;  Location: [place];  Service: General;  Laterality: Bilateral;  . ORIF ANKLE FRACTURE Left 03/19/2017   [place]  [place]  . PORT-A-CATH REMOVAL Left 12/01/2014   Procedure: REMOVAL PORT-A-CATH;  Surgeon: Autumn Messing III, MD;  Location: [place];  Service: General;  Laterality: Left;  .  PORTACATH PLACEMENT Left 03/10/2013   Procedure: INSERTION PORT-A-CATH;  Surgeon: Merrie Roof, MD;  Location: [place];  Service: General;  Laterality: Left;  . REMOVAL OF TISSUE EXPANDER AND PLACEMENT OF IMPLANT Left 05/05/2013   Procedure: REMOVAL OF LEFT BREAST TISSUE EXPANDER / incision and drainage of ceroma;  Surgeon: Crissie Reese, MD;  Location: [place];  Service: Plastics;  Laterality: Left;  . SHOULDER SURGERY     right shoulder scope, open RCR, biceps tendonesis 12/17/15  . TISSUE EXPANDER PLACEMENT Left 12/01/2013   Procedure: TISSUE EXPANDER FOR DELAYED LEFT BREAST RECONSTRUCTION;  Surgeon: Crissie Reese, MD;  Location: [place];  Service: Plastics;  Laterality: Left;  . WRIST SURGERY  2001    Review of systems negative except as noted in HPI / PMHx or noted below:  Review of Systems  Constitutional: Negative.   HENT: Negative.   Eyes: Negative.   Respiratory: Negative.   Cardiovascular: Negative.   Gastrointestinal: Negative.   Genitourinary: Negative.   Musculoskeletal: Negative.   Skin: Negative.   Neurological: Negative.   Endo/Heme/Allergies: Negative.   Psychiatric/Behavioral: Negative.      Objective:   Vitals:   10/05/17 1533  BP: 136/76  Pulse: 72  Resp: 18          Physical Exam  Skin: No rash noted.    Diagnostics: none  Assessment and Plan:   1. Allergic urticaria   2. Pruritic disorder     1.  Can reintroduce multivitamin  2.  Every day utilize the following medications:   A.  loratadine 10mg  1 time per day  B.  Montelukast 10 mg tablet 1 time a day  3.  Attempt to discontinue ranitidine  4. Return to clinic in 12 weeks. Taper?    Once again [name] is doing relatively well at this point in time and we will attempt to further consolidate her treatment by discontinuing her ranitidine.  She can reintroduce only 1 of her supplements at this step and she will use a multivitamin.  She will continue to utilize a antihistamine and a leukotriene  modifier and I will see her back in his clinic in 12 weeks or earlier if there is a problem.  [name], MD Allergy / Immunology [place]

## 2017-10-05 NOTE — Patient Instructions (Addendum)
  1.  Can reintroduce multivitamin  2.  Every day utilize the following medications:   A.  loratadine 10mg  1 time per day  B.  Montelukast 10 mg tablet 1 time a day  3.  Attempt to discontinue ranitidine  4. Return to clinic in 12 weeks. Taper?

## 2017-10-06 ENCOUNTER — Encounter: Payer: Self-pay | Admitting: Allergy and Immunology

## 2017-10-07 ENCOUNTER — Other Ambulatory Visit: Payer: Self-pay | Admitting: Internal Medicine

## 2017-10-08 ENCOUNTER — Other Ambulatory Visit: Payer: Self-pay | Admitting: Internal Medicine

## 2017-10-15 DIAGNOSIS — L219 Seborrheic dermatitis, unspecified: Secondary | ICD-10-CM | POA: Diagnosis not present

## 2017-10-15 DIAGNOSIS — B351 Tinea unguium: Secondary | ICD-10-CM | POA: Diagnosis not present

## 2017-11-12 DIAGNOSIS — R52 Pain, unspecified: Secondary | ICD-10-CM | POA: Diagnosis not present

## 2017-11-16 ENCOUNTER — Other Ambulatory Visit: Payer: Self-pay | Admitting: Internal Medicine

## 2017-11-16 DIAGNOSIS — L219 Seborrheic dermatitis, unspecified: Secondary | ICD-10-CM | POA: Diagnosis not present

## 2017-11-16 DIAGNOSIS — L570 Actinic keratosis: Secondary | ICD-10-CM | POA: Diagnosis not present

## 2017-12-28 ENCOUNTER — Ambulatory Visit (INDEPENDENT_AMBULATORY_CARE_PROVIDER_SITE_OTHER): Payer: Medicare Other | Admitting: Allergy and Immunology

## 2017-12-28 ENCOUNTER — Encounter: Payer: Self-pay | Admitting: Allergy and Immunology

## 2017-12-28 VITALS — BP 122/72 | HR 56 | Resp 16

## 2017-12-28 DIAGNOSIS — R232 Flushing: Secondary | ICD-10-CM

## 2017-12-28 DIAGNOSIS — L299 Pruritus, unspecified: Secondary | ICD-10-CM | POA: Diagnosis not present

## 2017-12-28 DIAGNOSIS — L500 Allergic urticaria: Secondary | ICD-10-CM

## 2017-12-28 NOTE — Patient Instructions (Signed)
  1.  Can reintroduce supplements  2.  Slowly taper off montelukast and loratadine individually  3.  Return to clinic if needed

## 2017-12-28 NOTE — Progress Notes (Signed)
Follow-up Note  Referring Provider: Burnis Medin, MD Primary Provider: Burnis Medin, MD Date of Office Visit: 12/28/2017  Subjective:   [name] (DOB: Dec 31, 1950) is a 67 y.o. female who returns to the [facility] [place] on 12/28/2017 in re-evaluation of the following:  HPI: [name] presents to this clinic in reevaluation of her allergic reactions presenting as urticaria and angioedema and flushing and her pruritic disorder involving predominantly her scalp.  I last saw her in this clinic on 05 October 2017.  [name] did visit with a dermatologist regarding her scalp itchiness and [name] was treated with a multitude of different agents including Diflucan twice a week for 10 weeks and a medicated shampoo and some steroid drops and [name] is much better at this point time and is not using any of those medications at this point.  [name] has not had any urticaria or angioedema or flushing and feels great.  [name] would like to restart her supplements.  During her last visit we eliminated her ranitidine and had her restart her multivitamins.  [name] did receive the flu vaccine this year.  Allergies as of 12/28/2017      Reactions   Ace Inhibitors Cough   Cephalexin Hives   Cephalosporins Hives   Possible allergy -- hives    Codeine Itching, Nausea Only   Tramadol Hives      Medication List      ALPRAZolam 0.25 MG tablet Commonly known as:  XANAX Take 1 tablet (0.25 mg total) by mouth 3 (three) times daily as needed for anxiety.   clobetasol 0.05 % external solution Commonly known as:  TEMOVATE APPLY TO ITCHY AREAS TWICE DAILY AS NEEDED   fluconazole 200 MG tablet Commonly known as:  DIFLUCAN TAKE 1 TABLET BY MOUTH TWICE WEEKLY   FLUoxetine 20 MG capsule Commonly known as:  PROZAC TAKE ONE CAPSULE DAILY   ketoconazole 2 % shampoo Commonly known as:  NIZORAL USE AS DIRECTED WITH ROTATING OTC SHAMPOOS   loratadine 10 MG tablet Commonly known as:   CLARITIN Take 1 tablet (10 mg total) by mouth daily.   metFORMIN 500 MG 24 hr tablet Commonly known as:  GLUCOPHAGE-XR TAKE TWO TABLETS BY MOUTH ONCE DAILY   metoprolol tartrate 50 MG tablet Commonly known as:  LOPRESSOR TAKE 1/2 TABLET (25 MG TOTAL) BY MOUTH 2 (TWO) TIMES DAILY.   montelukast 10 MG tablet Commonly known as:  SINGULAIR Take 1 tablet (10 mg total) by mouth at bedtime.   ranitidine 150 MG tablet Commonly known as:  ZANTAC Take 1 tablet (150 mg total) by mouth 2 (two) times daily.   triamterene-hydrochlorothiazide 37.5-25 MG tablet Commonly known as:  MAXZIDE-25 TAKE 1 TABLET BY MOUTH EVERY DAY       Past Medical History:  Diagnosis Date  . 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 Anxiety   . Arthritis   . 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 Cancer [facility])    right breast cancer  . Cough due to ACE inhibitor 04/28/2011  . Depression   . Ganglion cyst   . GERD (gastroesophageal reflux disease)    occ  . HYPERTENSION 11/22/2007   echo card 2004 nl lv function   . NECK PAIN 11/22/2007  . Other specified disorder of skin 12/25/2008  . PONV (postoperative nausea and vomiting)   . TOBACCO USE, QUIT 12/25/2008  . Urticaria     Past Surgical History:  Procedure Laterality Date  .  ABDOMINAL HYSTERECTOMY  1989  . APPENDECTOMY    . BREAST RECONSTRUCTION Left 12/01/2013   dr Harlow Mares  . BREAST RECONSTRUCTION Left 12/01/2013   Procedure:  BREAST RECONSTRUCTION;  Surgeon: Crissie Reese, MD;  Location: [place];  Service: Plastics;  Laterality: Left;  . BREAST RECONSTRUCTION WITH PLACEMENT OF TISSUE EXPANDER AND FLEX HD (ACELLULAR HYDRATED DERMIS) Bilateral 03/10/2013   Procedure: PLACEMENT OF BILATERAL TISSUE EXPANDER WITH POSSIBLE FLEX HD (ACELLULAR HYDRATED DERMIS) FOR BREAST RECONSTRUCTION;  Surgeon: Crissie Reese, MD;  Location: [place];  Service: Plastics;  Laterality: Bilateral;  . CHOLECYSTECTOMY  2/15  . COLONOSCOPY    . LAPAROSCOPIC  APPENDECTOMY    . MASTECTOMY COMPLETE / SIMPLE W/ SENTINEL NODE BIOPSY Bilateral   . MASTECTOMY W/ SENTINEL NODE BIOPSY Bilateral 03/10/2013   Procedure: BILATERAL MASTECTOMY WITH RIGHT SENTINEL LYMPH NODE BIOPSY FOLLOWED BY RECONSTRUCTION;  Surgeon: Merrie Roof, MD;  Location: [place];  Service: General;  Laterality: Bilateral;  . ORIF ANKLE FRACTURE Left 03/19/2017   [place]  [place]  . PORT-A-CATH REMOVAL Left 12/01/2014   Procedure: REMOVAL PORT-A-CATH;  Surgeon: Autumn Messing III, MD;  Location: [place];  Service: General;  Laterality: Left;  . PORTACATH PLACEMENT Left 03/10/2013   Procedure: INSERTION PORT-A-CATH;  Surgeon: Merrie Roof, MD;  Location: [place];  Service: General;  Laterality: Left;  . REMOVAL OF TISSUE EXPANDER AND PLACEMENT OF IMPLANT Left 05/05/2013   Procedure: REMOVAL OF LEFT BREAST TISSUE EXPANDER / incision and drainage of ceroma;  Surgeon: Crissie Reese, MD;  Location: [place];  Service: Plastics;  Laterality: Left;  . SHOULDER SURGERY     right shoulder scope, open RCR, biceps tendonesis 12/17/15  . TISSUE EXPANDER PLACEMENT Left 12/01/2013   Procedure: TISSUE EXPANDER FOR DELAYED LEFT BREAST RECONSTRUCTION;  Surgeon: Crissie Reese, MD;  Location: [place];  Service: Plastics;  Laterality: Left;  . WRIST SURGERY  2001    Review of systems negative except as noted in HPI / PMHx or noted below:  Review of Systems  Constitutional: Negative.   HENT: Negative.   Eyes: Negative.   Respiratory: Negative.   Cardiovascular: Negative.   Gastrointestinal: Negative.   Genitourinary: Negative.   Musculoskeletal: Negative.   Skin: Negative.   Neurological: Negative.   Endo/Heme/Allergies: Negative.   Psychiatric/Behavioral: Negative.      Objective:   Vitals:   12/28/17 1122  BP: 122/72  Pulse: (!) 56  Resp: 16          Physical Exam  HENT:  Head: Normocephalic.  Right Ear: Tympanic membrane, external ear and ear canal normal.  Left Ear: Tympanic  membrane, external ear and ear canal normal.  Nose: Nose normal. No mucosal edema or rhinorrhea.  Mouth/Throat: Uvula is midline, oropharynx is clear and moist and mucous membranes are normal. No oropharyngeal exudate.  Eyes: Conjunctivae are normal.  Neck: Trachea normal. No tracheal tenderness present. No tracheal deviation present. No thyromegaly present.  Cardiovascular: Normal rate, regular rhythm, S1 normal, S2 normal and normal heart sounds.  No murmur heard. Pulmonary/Chest: Breath sounds normal. No stridor. No respiratory distress. [name] has no wheezes. [name] has no rales.  Musculoskeletal: [name] exhibits no edema.  Lymphadenopathy:       Head (right side): No tonsillar adenopathy present.       Head (left side): No tonsillar adenopathy present.    [name] has no cervical adenopathy.  Neurological: [name] is alert.  Skin: No rash noted. [name] is not diaphoretic. No erythema.  Nails show no clubbing.    Diagnostics: none  Assessment and Plan:   1. Allergic urticaria   2. Flushing reaction   3. Pruritic disorder     1.  Can reintroduce supplements  2.  Slowly taper off montelukast and loratadine individually  3.  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  [name] will contact me should [name] have significant problems in the face of this plan.  [name], MD Allergy / Immunology [place]

## 2017-12-29 ENCOUNTER — Other Ambulatory Visit: Payer: Self-pay | Admitting: Internal Medicine

## 2017-12-29 ENCOUNTER — Encounter: Payer: Self-pay | Admitting: Allergy and Immunology

## 2018-01-04 ENCOUNTER — Other Ambulatory Visit: Payer: Self-pay | Admitting: Internal Medicine

## 2018-01-11 DIAGNOSIS — Z9013 Acquired absence of bilateral breasts and nipples: Secondary | ICD-10-CM | POA: Diagnosis not present

## 2018-01-11 DIAGNOSIS — Z171 Estrogen receptor negative status [ER-]: Secondary | ICD-10-CM | POA: Diagnosis not present

## 2018-01-11 DIAGNOSIS — Z853 Personal history of malignant neoplasm of breast: Secondary | ICD-10-CM | POA: Diagnosis not present

## 2018-01-11 DIAGNOSIS — Z9221 Personal history of antineoplastic chemotherapy: Secondary | ICD-10-CM | POA: Diagnosis not present

## 2018-01-12 ENCOUNTER — Other Ambulatory Visit: Payer: Self-pay | Admitting: Internal Medicine

## 2018-01-13 ENCOUNTER — Other Ambulatory Visit: Payer: Self-pay | Admitting: Internal Medicine

## 2018-02-26 DIAGNOSIS — H52222 Regular astigmatism, left eye: Secondary | ICD-10-CM | POA: Diagnosis not present

## 2018-02-26 DIAGNOSIS — H25813 Combined forms of age-related cataract, bilateral: Secondary | ICD-10-CM | POA: Diagnosis not present

## 2018-02-26 DIAGNOSIS — E119 Type 2 diabetes mellitus without complications: Secondary | ICD-10-CM | POA: Diagnosis not present

## 2018-02-26 DIAGNOSIS — I10 Essential (primary) hypertension: Secondary | ICD-10-CM | POA: Diagnosis not present

## 2018-02-26 DIAGNOSIS — Z7984 Long term (current) use of oral hypoglycemic drugs: Secondary | ICD-10-CM | POA: Diagnosis not present

## 2018-02-26 DIAGNOSIS — H5203 Hypermetropia, bilateral: Secondary | ICD-10-CM | POA: Diagnosis not present

## 2018-02-26 DIAGNOSIS — H524 Presbyopia: Secondary | ICD-10-CM | POA: Diagnosis not present

## 2018-02-26 LAB — HM DIABETES EYE EXAM

## 2018-03-15 ENCOUNTER — Ambulatory Visit (INDEPENDENT_AMBULATORY_CARE_PROVIDER_SITE_OTHER): Payer: Medicare Other | Admitting: Podiatry

## 2018-03-15 ENCOUNTER — Encounter: Payer: Self-pay | Admitting: Podiatry

## 2018-03-15 VITALS — BP 124/70 | HR 61 | Resp 16 | Ht 64.0 in | Wt 235.0 lb

## 2018-03-15 DIAGNOSIS — M2041 Other hammer toe(s) (acquired), right foot: Secondary | ICD-10-CM | POA: Diagnosis not present

## 2018-03-15 DIAGNOSIS — Q828 Other specified congenital malformations of skin: Secondary | ICD-10-CM | POA: Diagnosis not present

## 2018-03-15 NOTE — Progress Notes (Signed)
   Subjective:    Patient ID: [name], female    DOB: 06/26/1950, 68 y.o.   MRN: [id number]  HPI    Review of Systems  All other systems reviewed and are negative.      Objective:   Physical Exam        Assessment & Plan:

## 2018-03-15 NOTE — Progress Notes (Signed)
  Subjective:  Patient ID: [name], female    DOB: Jul 08, 1950,  MRN: [id number]  Chief Complaint  Patient presents with  . Skin Problem    R sub met 3 lesion x 1 mo; 5/10 tender to touch Tx: none -worse with walking, pressure, and barefooted    68 y.o. female presents with the above complaint. Also complains of callus to the fourth toe. Works on her feet as a Probation officer. History of breast cancer had chemo.  Review of Systems: Negative except as noted in the HPI. Denies N/V/F/Ch.  Past Medical History:  Diagnosis Date  . 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 Anxiety   . Arthritis   . 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 Cancer [facility])    right breast cancer  . Cough due to ACE inhibitor 04/28/2011  . Depression   . Ganglion cyst   . GERD (gastroesophageal reflux disease)    occ  . HYPERTENSION 11/22/2007   echo card 2004 nl lv function   . NECK PAIN 11/22/2007  . Other specified disorder of skin 12/25/2008  . PONV (postoperative nausea and vomiting)   . TOBACCO USE, QUIT 12/25/2008  . Urticaria     Current Outpatient Medications:  .  ALPRAZolam (XANAX) 0.25 MG tablet, Take 1 tablet (0.25 mg total) by mouth 3 (three) times daily as needed for anxiety., Disp: 30 tablet, Rfl: 0 .  metFORMIN (GLUCOPHAGE-XR) 500 MG 24 hr tablet, TAKE 2 TABLETS DAILY, Disp: 180 tablet, Rfl: 1 .  metoprolol tartrate (LOPRESSOR) 50 MG tablet, TAKE 1/2 TABLET (25 MG TOTAL) BY MOUTH 2 (TWO) TIMES DAILY., Disp: 90 tablet, Rfl: 1 .  PROZAC 20 MG capsule, TAKE ONE CAPSULE DAILY, Disp: 90 capsule, Rfl: 0 .  triamterene-hydrochlorothiazide (MAXZIDE-25) 37.5-25 MG tablet, Take 1 tablet by mouth daily. **DUE FOR OV FOR FURTHER REFILLS., Disp: 90 tablet, Rfl: 0  Social History   Tobacco Use  Smoking Status Former Smoker  . Packs/day: 1.50  . Years: 15.00  . Pack years: 22.50  . Types: Cigarettes  . Last attempt to quit: 03/04/1987  . Years since quitting: 31.0    Smokeless Tobacco Never Used    Allergies  Allergen Reactions  . Ace Inhibitors Cough  . Cephalexin Hives  . Cephalosporins Hives    Possible allergy -- hives   . Codeine Itching and Nausea Only  . Tramadol Hives   Objective:   Vitals:   03/15/18 1044  BP: 124/70  Pulse: 61  Resp: 16   Body mass index is 40.34 kg/m. Constitutional Well developed. Well nourished.  Vascular Dorsalis pedis pulses palpable bilaterally. Posterior tibial pulses palpable bilaterally. Capillary refill normal to all digits.  No cyanosis or clubbing noted. Pedal hair growth normal.  Neurologic Normal speech. Oriented to person, place, and time. Epicritic sensation to light touch grossly present bilaterally.  Dermatologic Nails well groomed and normal in appearance. No open wounds. Punctate keratosis subet 3 right Lateral 4th toe PIPJ callus  Orthopedic: Hammertoe left 4th toe    Radiographs: None Assessment:   1. Hammer toe of right foot   2. Porokeratosis    Plan:  Patient was evaluated and treated and all questions answered.  Hammertoe Right with Corn -Lesion debrided due to pain -Discussed possible surgical intervention, pt declines.  Porokeratosis -Debrided and destroyed with salinocaine. -Educated on washing the area in 6 hours -Advised of probable recurrence.  No follow-ups on file.

## 2018-03-17 ENCOUNTER — Encounter: Payer: Self-pay | Admitting: Internal Medicine

## 2018-04-04 ENCOUNTER — Other Ambulatory Visit: Payer: Self-pay | Admitting: Internal Medicine

## 2018-04-07 DIAGNOSIS — L0390 Cellulitis, unspecified: Secondary | ICD-10-CM | POA: Diagnosis not present

## 2018-04-07 DIAGNOSIS — L02212 Cutaneous abscess of back [any part, except buttock]: Secondary | ICD-10-CM | POA: Diagnosis not present

## 2018-04-07 DIAGNOSIS — L720 Epidermal cyst: Secondary | ICD-10-CM | POA: Diagnosis not present

## 2018-04-10 ENCOUNTER — Other Ambulatory Visit: Payer: Self-pay | Admitting: Internal Medicine

## 2018-04-28 ENCOUNTER — Other Ambulatory Visit: Payer: Self-pay | Admitting: Internal Medicine

## 2018-04-28 DIAGNOSIS — M7741 Metatarsalgia, right foot: Secondary | ICD-10-CM | POA: Diagnosis not present

## 2018-04-28 DIAGNOSIS — E119 Type 2 diabetes mellitus without complications: Secondary | ICD-10-CM | POA: Diagnosis not present

## 2018-04-28 DIAGNOSIS — L84 Corns and callosities: Secondary | ICD-10-CM | POA: Diagnosis not present

## 2018-04-29 NOTE — Telephone Encounter (Signed)
Last filled:01/15/18 Last OV:06/10/17 Upcoming appt:none scheduled

## 2018-04-29 NOTE — Telephone Encounter (Signed)
Have her make yearly appt  April  And then revfill med  For 90 days

## 2018-05-17 DIAGNOSIS — L02212 Cutaneous abscess of back [any part, except buttock]: Secondary | ICD-10-CM | POA: Diagnosis not present

## 2018-05-18 DIAGNOSIS — G5601 Carpal tunnel syndrome, right upper limb: Secondary | ICD-10-CM | POA: Diagnosis not present

## 2018-05-26 ENCOUNTER — Telehealth: Payer: Self-pay | Admitting: Internal Medicine

## 2018-05-26 NOTE — Telephone Encounter (Signed)
Pt would like to see if she could obtain a glucose meter she stated that she never had one in the past and would like to have one so that she can check her sugars.  She state that it isn't really pressing but would like to see if Dr. Regis Bill would give her one.    Pt would like to have a call to discuss it.

## 2018-05-26 NOTE — Telephone Encounter (Signed)
No need for e visit  To get machine   Can send in  Glucometer and strips  For daily checking   have her check with her insurance to see which is  Preferred so strips are covered ,   Have her record her  Readings  For future review.   Dx  Dm type 2

## 2018-05-27 NOTE — Telephone Encounter (Signed)
Lvm for pt to call back to ask her to see  What her insurance will cover

## 2018-05-29 ENCOUNTER — Telehealth: Payer: Self-pay | Admitting: Internal Medicine

## 2018-05-29 NOTE — Telephone Encounter (Signed)
Pt called and l/m saying that she is returning a phone call that some left her a voicemail.

## 2018-05-31 ENCOUNTER — Other Ambulatory Visit: Payer: Self-pay

## 2018-05-31 MED ORDER — BLOOD GLUCOSE MONITOR KIT: PACK | 0 refills | Status: AC

## 2018-05-31 MED ORDER — BLOOD GLUCOSE MONITOR KIT: PACK | 0 refills | Status: DC

## 2018-05-31 NOTE — Telephone Encounter (Signed)
Called pt back documented in other phone note from 05/26/2018

## 2018-05-31 NOTE — Telephone Encounter (Signed)
Patient called back to say that per Medicare there is no specific monitor that they will pay for just any continuous Glucometer that is FDA approved. Please send Rx to  [id number] [address], [place] [phone] (Phone) [phone] (Fax)

## 2018-05-31 NOTE — Telephone Encounter (Signed)
Spoke with pt she will call medicare and figure out which glucometer they cover.

## 2018-06-02 ENCOUNTER — Encounter: Payer: Medicare Other | Admitting: Internal Medicine

## 2018-06-07 NOTE — Telephone Encounter (Signed)
Pt states that this hasn't been called in yet and she would like to know when it will be called in.  States this is very important.

## 2018-06-07 NOTE — Telephone Encounter (Signed)
lvm for pharmacy and gave them the cpde E11.9

## 2018-06-07 NOTE — Telephone Encounter (Signed)
Faxed over prescription to pharmacy

## 2018-06-07 NOTE — Telephone Encounter (Signed)
Copied from [place] [phone]. Topic: Quick Communication - Rx Refill/Question >> Jun 07, 2018 11:14 AM Scherrie Gerlach wrote: Medication: blood glucose meter kit and supplies KIT  Pharmacy sates they need C -10 code and not IC -9. [id number] [phone](Phone) [phone](Fax)

## 2018-06-10 ENCOUNTER — Telehealth: Payer: Self-pay | Admitting: Internal Medicine

## 2018-06-10 MED ORDER — GLUCOSE BLOOD VI STRP: ORAL_STRIP | 12 refills | Status: AC

## 2018-06-10 NOTE — Telephone Encounter (Signed)
Copied from [place] [phone]. Topic: Quick Communication - Rx Refill/Question >> Jun 10, 2018  9:49 AM Virl Axe D wrote: Medication: Accuchek Aviva Plus test strips / Pt stated the pharmacy needs rx for test strips. Insurance will cover 3x per day. Please contact pt once done/ CB#[phone]  Has the patient contacted their pharmacy? Yes.   (Agent: If no, request that the patient contact the pharmacy for the refill.) (Agent: If yes, when and what did the pharmacy advise?)  Preferred Pharmacy (with phone number or street name): [id number] [address], [place] [phone] (Phone) [phone] (Fax)  Agent: Please be advised that RX refills may take up to 3 business days. We ask that you follow-up with your pharmacy.

## 2018-06-10 NOTE — Telephone Encounter (Signed)
prescrition was sent

## 2018-06-29 ENCOUNTER — Other Ambulatory Visit: Payer: Self-pay | Admitting: Internal Medicine

## 2018-07-07 DIAGNOSIS — G5601 Carpal tunnel syndrome, right upper limb: Secondary | ICD-10-CM | POA: Diagnosis not present

## 2018-07-10 ENCOUNTER — Other Ambulatory Visit: Payer: Self-pay | Admitting: Internal Medicine

## 2018-07-13 NOTE — Telephone Encounter (Signed)
She has appt  July 1  Can  Refill x 1   But needs to get labs updated  Sooner    Please arrange for her to get   Bmp hg a1c lfts  Lipid panel  Urine microalbumin creatinine ratio  Dx   Hyperglycemia,  HT  HLD  Med management in the  Next few weeks  And keep appt in July .

## 2018-07-14 NOTE — Telephone Encounter (Signed)
Please place orders and get her lab appt

## 2018-07-22 ENCOUNTER — Other Ambulatory Visit: Payer: Self-pay | Admitting: Internal Medicine

## 2018-08-31 NOTE — Progress Notes (Signed)
Chief Complaint  Patient presents with  . Annual Exam    wants to talk about medications.     HPI: [name] 68 y.o. comes in today for Preventive Medicare examyearly visit med  .Since last visit.  DM  Checking a bit high   150 160  Cookies at night  A bit more than usual only on 500 metformin pe rday BP  : ok  130 120   Breast cancer   Yearly  surgeron and   oncologist .  Mood  ok GI    Stopped the metformin at night .  And helped the   Gi sx so only taking one metformin per day  Rash a nd suns sensitivity .   Evaluation tried stopping the diuretic and bp olk off since April and seems ok  To follow  Working in her salon 3 d per week    Health Maintenance  Topic Date Due  . DEXA SCAN  10/17/2015  . FOOT EXAM  03/31/2017  . HEMOGLOBIN A1C  12/10/2017  . URINE MICROALBUMIN  05/05/2018  . MAMMOGRAM  11/18/2026 (Originally 01/15/2015)  . INFLUENZA VACCINE  10/02/2018  . COLONOSCOPY  12/05/2018  . OPHTHALMOLOGY EXAM  02/27/2019  . TETANUS/TDAP  12/21/2022  . Hepatitis C Screening  Completed  . 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 GI /GU: No adominal pain, vomiting, change in bowel habits. No blood in the stool. No significant GU symptoms. SKIN/HEME: ,no acute skin rashes suspicious lesions or bleeding. No lymphadenopathy, nodules, masses.  NEURO/ PSYCH:  No neurologic signs such as weakness numbness. No depression anxiety. IMM/ Allergy: No unusual infections.  Allergy .   REST of 12 system review negative except as per HPI   Past  Medical History:  Diagnosis Date  . ADJ DISORDER WITH MIXED ANXIETY \T_0 DEPRESSED MOOD 11/22/2007  . Anxiety   . Arthritis   . 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 Cancer [facility])    right breast cancer  . Cough due to ACE inhibitor 04/28/2011  . Depression   . Ganglion cyst   . GERD (gastroesophageal reflux disease)    occ  . HYPERTENSION 11/22/2007   echo card 2004 nl lv function   . NECK PAIN 11/22/2007  . Other specified disorder of skin 12/25/2008  . PONV (postoperative nausea and vomiting)   . TOBACCO USE, QUIT 12/25/2008  . Urticaria     Family History  Problem Relation Age of Onset  . Alzheimer's disease Mother   . Hypertension Mother   . Anxiety disorder Mother   . Allergic rhinitis Mother   . Diabetes Father   . Alcoholism Father   . Cancer Brother        Cancer at base of tongue  . Diabetes Other        1st degree relative  . Bladder Cancer Brother     Social History   Socioeconomic History  . Marital status: Divorced    Spouse name: Not  on file  . Number of children: Not on file  . Years of education: Not on file  . Highest education level: Not on file  Occupational History  . Occupation: Theme park manager  Social Needs  . Financial resource strain: Not on file  . Food insecurity    Worry: Not on file    Inability: Not on file  . Transportation needs    Medical: Not on file    Non-medical: Not on file  Tobacco Use  . Smoking status: Former Smoker    Packs/day: 1.50    Years: 15.00    Pack years: 22.50    Types: Cigarettes    Quit date: 03/04/1987    Years since quitting: 31.5  . Smokeless tobacco: Never Used  Substance and Sexual Activity  . Alcohol use: No  . Drug use: No  . Sexual activity: Not on file  Lifestyle  . Physical activity    Days per week: Not on file    Minutes per session: Not on file  . Stress: Not on file  Relationships  . Social Herbalist on phone: Not on file    Gets together: Not on file    Attends religious  service: Not on file    Active member of club or organization: Not on file    Attends meetings of clubs or organizations: Not on file    Relationship status: Not on file  Other Topics Concern  . 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    Former tobacco  No tad     Divorced.       Brother temp , son back in for temporarily.  People      Dog     Outpatient Encounter Medications as of 09/01/2018  Medication Sig  . metFORMIN (GLUCOPHAGE-XR) 500 MG 24 hr tablet TAKE 2 TABLETS DAILY (Patient taking differently: every morning. Take 1 in morning)  . metoprolol tartrate (LOPRESSOR) 50 MG tablet TAKE 1/2 TABLET (25 MG TOTAL) BY MOUTH 2 (TWO) TIMES DAILY.  Marland Kitchen ALPRAZolam (XANAX) 0.25 MG tablet Take 1 tablet (0.25 mg total) by mouth 3 (three) times daily as needed for anxiety.  . blood glucose meter kit and supplies KIT Dispense based on patient and insurance preference. Use up to four times daily as directed. (FOR ICD-9 250.00, 250.01).  Marland Kitchen FLUoxetine (PROZAC) 20 MG capsule TAKE 1 CAPSULE BY MOUTH EVERY DAY  . glucose blood test strip Use as instructed up to 3 times a day  . triamterene-hydrochlorothiazide (MAXZIDE-25) 37.5-25 MG tablet TAKE 1 TABLET BY MOUTH DAILY. **DUE FOR OV FOR FURTHER REFILLS. (Patient not taking: Reported on 09/01/2018)  . [DISCONTINUED] ALPRAZolam (XANAX) 0.25 MG tablet Take 1 tablet (0.25 mg total) by mouth 3 (three) times daily as needed for anxiety. (Patient not taking: Reported on 09/01/2018)   No facility-administered encounter medications on file as of 09/01/2018.     EXAM:  BP 132/70 (BP Location: Right Arm, Patient Position: Sitting, Cuff Size: Large)   Pulse 71   Temp 98.1 F (36.7 C) (Oral)   Ht 5' 4" (1.626 m)   Wt 237 lb 3.2 oz (107.6 kg)   SpO2 98%   BMI 40.72 kg/m   Body mass index is 40.72 kg/m.  Physical Exam: Vital signs reviewed VZC:HYIF is a  well-developed well-nourished alert cooperative   who  appears stated age in no acute distress.  HEENT: normocephalic atraumatic , Eyes: PERRL EOM's full, conjunctiva clear, Nares: paten,t no deformity discharge or tenderness., Ears: no deformity EAC's clear TMs with normal landmarks. Mouth: deferred NECK: supple without masses, thyromegaly or bruits. CHEST/PULM:  Clear to auscultation and percussion breath sounds equal no wheeze , rales or rhonchi. No chest wall deformities or tenderness. Breast reconstructed no masse axilla clear  CV: PMI is nondisplaced, S1 S2 no gallops, murmurs, rubs. Peripheral pulses are full without delay.No JVD .  ABDOMEN: Bowel sounds normal nontender  No guard or rebound, no hepato splenomegal no CVA tenderness.   Extremtities:  No clubbing cyanosis or edema, no acute joint swelling or redness no focal atrophy NEURO:  Oriented x3, cranial nerves 3-12 appear to be intact, no obvious focal weakness,gait within normal limits no abnormal reflexes or asymmetrical SKIN: No acute rashes normal turgor, color, no bruising or petechiae. PSYCH: Oriented, good eye contact, no obvious depression anxiety, cognition and judgment appear normal. LN: no cervical axillary adenopathy No noted deficits in memory, attention, and speech.  Diabetic Foot Exam - Simple   Simple Foot Form Diabetic Foot exam was performed with the following findings: Yes 09/01/2018 10:37 AM  Visual Inspection No deformities, no ulcerations, no other skin breakdown bilaterally: Yes Sensation Testing Intact to touch and monofilament testing bilaterally: Yes Pulse Check Posterior Tibialis and Dorsalis pulse intact bilaterally: Yes Comments     Lab Results  Component Value Date   WBC 5.7 05/04/2017   HGB 14.4 05/04/2017   HCT 41.6 05/04/2017   PLT 182.0 05/04/2017   GLUCOSE 109 (H) 06/10/2017   CHOL 203 (H) 03/09/2017   TRIG 209.0 (H) 03/09/2017   HDL 49.00 03/09/2017   LDLDIRECT 137.0 03/09/2017    LDLCALC 133 (H) 03/31/2016   ALT 29 03/09/2017   AST 26 03/09/2017   NA 141 06/10/2017   K 3.7 06/10/2017   CL 101 06/10/2017   CREATININE 0.85 06/10/2017   BUN 22 06/10/2017   CO2 31 06/10/2017   TSH 3.83 05/04/2017   HGBA1C 6.7 (H) 06/10/2017   MICROALBUR <0.7 05/04/2017    ASSESSMENT AND PLAN:  Discussed the following assessment and plan:   ICD-10-CM   1. Medication management  Y37.096 Basic metabolic panel    Hemoglobin A1c    Lipid panel    Microalbumin / creatinine urine ratio    Hepatic function panel    CBC with Differential/Platelet  2. Essential hypertension  K38 Basic metabolic panel    Hemoglobin A1c    Lipid panel    Microalbumin / creatinine urine ratio    Hepatic function panel    CBC with Differential/Platelet  3. Personal history of breast cancer  V81.8 Basic metabolic panel    Hemoglobin A1c    Lipid panel    Microalbumin / creatinine urine ratio    Hepatic function panel    CBC with Differential/Platelet  4. Diabetes mellitus without complication (HCC)  M03.7 Basic metabolic panel    Hemoglobin A1c    Lipid panel    Microalbumin / creatinine urine ratio    Hepatic function panel    CBC with Differential/Platelet     Full labs  Not fasting   Took shake this am forgot to fast  But ok  Ok to stay off  Diuretic for now if bp at goal  Disc statin  Willing if no se   Will see when labs back and then make plan  And fu  Lab  Etc Ok to refill alprazolam  If travels still  Has some left over from  Prev rx in 2018 Patient Care Team: Panosh, Standley Brooking, MD as PCP - General Hinton Rao Chinita Pester, MD as Consulting Physician (Oncology)  Patient Instructions  Glad you are doing well.  Attend to healthy diet  And will let you know   Lab results  If all ok then rov 6 months to check  Hg a1c bg maybe lipids  Usually  We should be on a statin medication and  Think about this  To avoid heart attack and stroke   The 10-year ASCVD risk score Mikey Bussing DC Brooke Bonito., et al.,  2013) is: 18.8%   Values used to calculate the score:     Age: 19 years     Sex: Female     Is Non-Hispanic African American: No     Diabetic: Yes     Tobacco smoker: No     Systolic Blood Pressure: 976 mmHg     Is BP treated: Yes     HDL Cholesterol: 49 mg/dL     Total Cholesterol: 203 mg/dL    Standley Brooking. Panosh M.D.

## 2018-09-01 ENCOUNTER — Other Ambulatory Visit: Payer: Self-pay | Admitting: Internal Medicine

## 2018-09-01 ENCOUNTER — Other Ambulatory Visit: Payer: Self-pay

## 2018-09-01 ENCOUNTER — Ambulatory Visit (INDEPENDENT_AMBULATORY_CARE_PROVIDER_SITE_OTHER): Payer: Medicare Other | Admitting: Internal Medicine

## 2018-09-01 ENCOUNTER — Encounter: Payer: Self-pay | Admitting: Internal Medicine

## 2018-09-01 VITALS — BP 132/70 | HR 71 | Temp 98.1°F | Ht 64.0 in | Wt 237.2 lb

## 2018-09-01 DIAGNOSIS — Z853 Personal history of malignant neoplasm of breast: Secondary | ICD-10-CM | POA: Diagnosis not present

## 2018-09-01 DIAGNOSIS — Z79899 Other long term (current) drug therapy: Secondary | ICD-10-CM

## 2018-09-01 DIAGNOSIS — I10 Essential (primary) hypertension: Secondary | ICD-10-CM

## 2018-09-01 DIAGNOSIS — E119 Type 2 diabetes mellitus without complications: Secondary | ICD-10-CM

## 2018-09-01 LAB — CBC WITH DIFFERENTIAL/PLATELET
Basophils Absolute: 0 10*3/uL (ref 0.0–0.1)
Basophils Relative: 0.5 % (ref 0.0–3.0)
Eosinophils Absolute: 0.3 10*3/uL (ref 0.0–0.7)
Eosinophils Relative: 5.4 % — ABNORMAL HIGH (ref 0.0–5.0)
HCT: 43.1 % (ref 36.0–46.0)
Hemoglobin: 14.4 g/dL (ref 12.0–15.0)
Lymphocytes Relative: 24.3 % (ref 12.0–46.0)
Lymphs Abs: 1.2 10*3/uL (ref 0.7–4.0)
MCHC: 33.3 g/dL (ref 30.0–36.0)
MCV: 88.2 fl (ref 78.0–100.0)
Monocytes Absolute: 0.6 10*3/uL (ref 0.1–1.0)
Monocytes Relative: 11.7 % (ref 3.0–12.0)
Neutro Abs: 3 10*3/uL (ref 1.4–7.7)
Neutrophils Relative %: 58.1 % (ref 43.0–77.0)
Platelets: 164 10*3/uL (ref 150.0–400.0)
RBC: 4.89 Mil/uL (ref 3.87–5.11)
RDW: 13.3 % (ref 11.5–15.5)
WBC: 5.1 10*3/uL (ref 4.0–10.5)

## 2018-09-01 LAB — MICROALBUMIN / CREATININE URINE RATIO
Creatinine,U: 108.5 mg/dL
Microalb Creat Ratio: 0.6 mg/g (ref 0.0–30.0)
Microalb, Ur: <0.7 mg/dL (ref 0.0–1.9)

## 2018-09-01 LAB — HEMOGLOBIN A1C: Hgb A1c MFr Bld: 7.1 % — ABNORMAL HIGH (ref 4.6–6.5)

## 2018-09-01 LAB — BASIC METABOLIC PANEL
BUN: 19 mg/dL (ref 6–23)
CO2: 31 mEq/L (ref 19–32)
Calcium: 9.5 mg/dL (ref 8.4–10.5)
Chloride: 102 mEq/L (ref 96–112)
Creatinine, Ser: 0.81 mg/dL (ref 0.40–1.20)
GFR: 70.34 mL/min (ref >60.00)
Glucose, Bld: 140 mg/dL — ABNORMAL HIGH (ref 70–99)
Potassium: 4.7 mEq/L (ref 3.5–5.1)
Sodium: 139 mEq/L (ref 135–145)

## 2018-09-01 LAB — LIPID PANEL
Cholesterol: 218 mg/dL — ABNORMAL HIGH (ref 0–200)
HDL: 53.5 mg/dL (ref >39.00)
LDL Cholesterol: 137 mg/dL — ABNORMAL HIGH (ref 0–99)
NonHDL: 164.62
Total CHOL/HDL Ratio: 4
Triglycerides: 138 mg/dL (ref 0.0–149.0)
VLDL: 27.6 mg/dL (ref 0.0–40.0)

## 2018-09-01 LAB — HEPATIC FUNCTION PANEL
ALT: 25 U/L (ref 0–35)
AST: 25 U/L (ref 0–37)
Albumin: 4.3 g/dL (ref 3.5–5.2)
Alkaline Phosphatase: 71 U/L (ref 39–117)
Bilirubin, Direct: 0.2 mg/dL (ref 0.0–0.3)
Total Bilirubin: 1 mg/dL (ref 0.2–1.2)
Total Protein: 6.7 g/dL (ref 6.0–8.3)

## 2018-09-01 MED ORDER — ALPRAZOLAM 0.25 MG PO TABS: 0.2500 mg | ORAL_TABLET | Freq: Three times a day (TID) | ORAL | 0 refills | Status: AC | PRN

## 2018-09-01 NOTE — Patient Instructions (Addendum)
Glad you are doing well.  Attend to healthy diet  And will let you know   Lab results  If all ok then rov 6 months to check  Hg a1c bg maybe lipids  Usually  We should be on a statin medication and  Think about this  To avoid heart attack and stroke   The 10-year ASCVD risk score Mikey Bussing DC Brooke Bonito., et al., 2013) is: 18.8%   Values used to calculate the score:     Age: 68 years     Sex: Female     Is Non-Hispanic African American: No     Diabetic: Yes     Tobacco smoker: No     Systolic Blood Pressure: 496 mmHg     Is BP treated: Yes     HDL Cholesterol: 49 mg/dL     Total Cholesterol: 203 mg/dL

## 2018-09-10 ENCOUNTER — Other Ambulatory Visit: Payer: Self-pay

## 2018-09-10 DIAGNOSIS — E119 Type 2 diabetes mellitus without complications: Secondary | ICD-10-CM

## 2018-09-10 DIAGNOSIS — E785 Hyperlipidemia, unspecified: Secondary | ICD-10-CM

## 2018-09-10 MED ORDER — LORATADINE 10 MG PO TABS: 10.0000 mg | ORAL_TABLET | Freq: Every day | ORAL | 0 refills | Status: DC

## 2018-09-10 MED ORDER — ROSUVASTATIN CALCIUM 10 MG PO TABS: 10.0000 mg | ORAL_TABLET | Freq: Every day | ORAL | 5 refills | Status: DC

## 2018-09-30 ENCOUNTER — Other Ambulatory Visit: Payer: Self-pay | Admitting: Internal Medicine

## 2018-09-30 ENCOUNTER — Other Ambulatory Visit: Payer: Self-pay

## 2018-10-05 ENCOUNTER — Other Ambulatory Visit: Payer: Self-pay | Admitting: Allergy and Immunology

## 2018-10-19 ENCOUNTER — Other Ambulatory Visit: Payer: Self-pay | Admitting: Internal Medicine

## 2018-10-23 ENCOUNTER — Other Ambulatory Visit: Payer: Self-pay | Admitting: Internal Medicine

## 2018-10-23 ENCOUNTER — Other Ambulatory Visit: Payer: Self-pay | Admitting: Allergy and Immunology

## 2018-11-01 ENCOUNTER — Other Ambulatory Visit: Payer: Self-pay | Admitting: Allergy and Immunology

## 2018-11-05 ENCOUNTER — Other Ambulatory Visit: Payer: Self-pay | Admitting: Internal Medicine

## 2018-11-24 DIAGNOSIS — C50911 Malignant neoplasm of unspecified site of right female breast: Secondary | ICD-10-CM | POA: Diagnosis not present

## 2018-12-07 DIAGNOSIS — Z23 Encounter for immunization: Secondary | ICD-10-CM | POA: Diagnosis not present

## 2018-12-13 ENCOUNTER — Other Ambulatory Visit: Payer: Self-pay

## 2018-12-13 ENCOUNTER — Telehealth (INDEPENDENT_AMBULATORY_CARE_PROVIDER_SITE_OTHER): Payer: Medicare Other | Admitting: Family Medicine

## 2018-12-13 DIAGNOSIS — M79605 Pain in left leg: Secondary | ICD-10-CM | POA: Diagnosis not present

## 2018-12-13 DIAGNOSIS — W108XXD Fall (on) (from) other stairs and steps, subsequent encounter: Secondary | ICD-10-CM

## 2018-12-13 DIAGNOSIS — S0990XD Unspecified injury of head, subsequent encounter: Secondary | ICD-10-CM

## 2018-12-13 NOTE — Progress Notes (Signed)
Virtual Visit via Video Note  I connected with [name] on 12/13/18 at  2:30 PM EDT by a video enabled telemedicine application 2/2 [id number] pandemic and verified that I am speaking with the correct person using two identifiers.  Location patient: home Location provider:work or home office Persons participating in the virtual visit: patient, provider  I discussed the limitations of evaluation and management by telemedicine and the availability of in person appointments. The patient expressed understanding and agreed to proceed.   HPI: Pt is a 68 yo female with pmh sig for HTN, varicose veins, GERD, DM II, h/o tobacco use,  HLD, h/o anxiety and depression, h/o R beast cancer seen by Dr. Regis Jackson.  Pt seen for acute concern.  Pt fell last Monday after tripping on a plant while going down her front steps.  Pt was not holding onto the rail.  She landed hitting the concrete with her L forehead and L leg.  No LOC.  Pt notes soreness in posterior L leg and ecchymosis.  She worked all last wk and felt fine.  Pt went to UC today, but was advised to f/u with ED or PCP to get a CT scan 2/2 "racoon eyes".  Pt lives in [place].  Pt denies and facial pain, memory loss, dizziness, HAs, sensitivity to light or sound, loose teeth or tooth pain, epistaxis, facial numbness or tingling, hearing difficulties.  Pt does take any blood thinners.   ROS: See pertinent positives and negatives per HPI.  Past Medical History:  Diagnosis Date  . 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 Anxiety   . Arthritis   . 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 Cancer [facility])    right breast cancer  . Cough due to ACE inhibitor 04/28/2011  . Depression   . Ganglion cyst   . GERD (gastroesophageal reflux disease)    occ  . HYPERTENSION 11/22/2007   echo card 2004 nl lv function   . NECK PAIN 11/22/2007  . Other specified disorder of skin 12/25/2008  . PONV (postoperative nausea and vomiting)   .  TOBACCO USE, QUIT 12/25/2008  . Urticaria     Past Surgical History:  Procedure Laterality Date  . ABDOMINAL HYSTERECTOMY  1989  . APPENDECTOMY    . BREAST RECONSTRUCTION Left 12/01/2013   dr Harlow Mares  . BREAST RECONSTRUCTION Left 12/01/2013   Procedure:  BREAST RECONSTRUCTION;  Surgeon: Crissie Reese, MD;  Location: [place];  Service: Plastics;  Laterality: Left;  . BREAST RECONSTRUCTION WITH PLACEMENT OF TISSUE EXPANDER AND FLEX HD (ACELLULAR HYDRATED DERMIS) Bilateral 03/10/2013   Procedure: PLACEMENT OF BILATERAL TISSUE EXPANDER WITH POSSIBLE FLEX HD (ACELLULAR HYDRATED DERMIS) FOR BREAST RECONSTRUCTION;  Surgeon: Crissie Reese, MD;  Location: [place];  Service: Plastics;  Laterality: Bilateral;  . CHOLECYSTECTOMY  2/15  . COLONOSCOPY    . LAPAROSCOPIC APPENDECTOMY    . MASTECTOMY COMPLETE / SIMPLE W/ SENTINEL NODE BIOPSY Bilateral   . MASTECTOMY W/ SENTINEL NODE BIOPSY Bilateral 03/10/2013   Procedure: BILATERAL MASTECTOMY WITH RIGHT SENTINEL LYMPH NODE BIOPSY FOLLOWED BY RECONSTRUCTION;  Surgeon: Merrie Roof, MD;  Location: [place];  Service: General;  Laterality: Bilateral;  . ORIF ANKLE FRACTURE Left 03/19/2017   [facility]  [place]  . PORT-A-CATH REMOVAL Left 12/01/2014   Procedure: REMOVAL PORT-A-CATH;  Surgeon: Autumn Messing III, MD;  Location: [place];  Service: General;  Laterality: Left;  . PORTACATH PLACEMENT Left 03/10/2013   Procedure: INSERTION PORT-A-CATH;  Surgeon:  Merrie Roof, MD;  Location: [place];  Service: General;  Laterality: Left;  . REMOVAL OF TISSUE EXPANDER AND PLACEMENT OF IMPLANT Left 05/05/2013   Procedure: REMOVAL OF LEFT BREAST TISSUE EXPANDER / incision and drainage of ceroma;  Surgeon: Crissie Reese, MD;  Location: [place];  Service: Plastics;  Laterality: Left;  . SHOULDER SURGERY     right shoulder scope, open RCR, biceps tendonesis 12/17/15  . TISSUE EXPANDER PLACEMENT Left 12/01/2013   Procedure: TISSUE EXPANDER FOR DELAYED LEFT BREAST RECONSTRUCTION;   Surgeon: Crissie Reese, MD;  Location: [place];  Service: Plastics;  Laterality: Left;  . WRIST SURGERY  2001    Family History  Problem Relation Age of Onset  . Alzheimer's disease Mother   . Hypertension Mother   . Anxiety disorder Mother   . Allergic rhinitis Mother   . Diabetes Father   . Alcoholism Father   . Cancer Brother        Cancer at base of tongue  . Diabetes Other        1st degree relative  . Bladder Cancer Brother      Current Outpatient Medications:  .  ALPRAZolam (XANAX) 0.25 MG tablet, Take 1 tablet (0.25 mg total) by mouth 3 (three) times daily as needed for anxiety., Disp: 24 tablet, Rfl: 0 .  blood glucose meter kit and supplies KIT, Dispense based on patient and insurance preference. Use up to four times daily as directed. (FOR ICD-9 250.00, 250.01)., Disp: 1 each, Rfl: 0 .  FLUoxetine (PROZAC) 20 MG capsule, TAKE 1 CAPSULE BY MOUTH EVERY DAY, Disp: 90 capsule, Rfl: 0 .  glucose blood test strip, Use as instructed up to 3 times a day, Disp: 100 each, Rfl: 12 .  loratadine (CLARITIN) 10 MG tablet, TAKE 1 TABLET BY MOUTH EVERY DAY, Disp: 90 tablet, Rfl: 1 .  metFORMIN (GLUCOPHAGE-XR) 500 MG 24 hr tablet, TAKE 2 TABLETS BY MOUTH EVERY DAY, Disp: 180 tablet, Rfl: 1 .  metoprolol tartrate (LOPRESSOR) 50 MG tablet, TAKE 1/2 TABLET (25 MG TOTAL) BY MOUTH 2 (TWO) TIMES DAILY., Disp: 90 tablet, Rfl: 1 .  rosuvastatin (CRESTOR) 10 MG tablet, Take 1 tablet (10 mg total) by mouth daily. Take 1 pill 3 times a week for a couple weeks then increase to daily if tolerated, Disp: 30 tablet, Rfl: 5  EXAM:  VITALS per patient if applicable: RR between 11-57 bpm  GENERAL: alert, oriented, appears well and in no acute distress  HEENT: b/l racoon eyes, EOMI intact, conjunctiva clear, no obvious abnormalities on inspection of external nose and ears   NECK: normal movements of the head and neck  LUNGS: on inspection no signs of respiratory distress, breathing rate appears normal,  no obvious gross SOB, gasping or wheezing  CV: no obvious cyanosis  SKIN:  L forehead with visible raised area without erythema or drainage  MS: moves all visible extremities without noticeable abnormality  PSYCH/NEURO: pleasant and cooperative, no obvious depression or anxiety, speech and thought processing grossly intact  ASSESSMENT AND PLAN:  Discussed the following assessment and plan:  Traumatic injury of head, subsequent encounter -supportive care.  -will obtain CT to r/o basilar skull fx. -pt given precautions.   - Plan: CT Maxillofacial WO CM  Fall (on) (from) other stairs and steps, subsequent encounter  - Plan: CT Maxillofacial WO CM  Left leg pain -discussed supportive care -ice, heat, massage, elevation, tylenol prn  Given precautions for worsening symptoms.  F/u with pcp in  the next wk.   I discussed the assessment and treatment plan with the patient. The patient was provided an opportunity to ask questions and all were answered. The patient agreed with the plan and demonstrated an understanding of the instructions.   The patient was advised to call back or seek an in-person evaluation if the symptoms worsen or if the condition fails to improve as anticipated.  Billie Ruddy, MD

## 2018-12-14 DIAGNOSIS — S0990XD Unspecified injury of head, subsequent encounter: Secondary | ICD-10-CM | POA: Diagnosis not present

## 2018-12-14 DIAGNOSIS — S0511XA Contusion of eyeball and orbital tissues, right eye, initial encounter: Secondary | ICD-10-CM | POA: Diagnosis not present

## 2018-12-14 DIAGNOSIS — S0512XA Contusion of eyeball and orbital tissues, left eye, initial encounter: Secondary | ICD-10-CM | POA: Diagnosis not present

## 2018-12-15 ENCOUNTER — Telehealth: Payer: Self-pay

## 2018-12-15 ENCOUNTER — Other Ambulatory Visit: Payer: Medicare Other

## 2018-12-15 DIAGNOSIS — L03019 Cellulitis of unspecified finger: Secondary | ICD-10-CM | POA: Diagnosis not present

## 2018-12-15 NOTE — Telephone Encounter (Signed)
Copied from [place] [id number]. Topic: General - Call Back - No Documentation >> Dec 14, 2018  5:23 PM Erick Blinks wrote: [facility]: [id number] [place], [place] over a STAT form. Wants call back to confirm if office has received this. Please advise

## 2018-12-16 NOTE — Telephone Encounter (Signed)
Did not receive a form. Called back and they are going refax a stat report

## 2018-12-16 NOTE — Telephone Encounter (Signed)
Received results waiting for Dr.Panosh to view out of office until tomorrow. Placed on desk

## 2018-12-16 NOTE — Telephone Encounter (Signed)
Pt called to find out if office received any results for her CT / please advise

## 2018-12-17 NOTE — Telephone Encounter (Signed)
Sent pt mychart message

## 2018-12-17 NOTE — Telephone Encounter (Signed)
Dr Volanda Napoleon ordered the Ct scan     It was normal  And no fractures .

## 2018-12-20 DIAGNOSIS — G5601 Carpal tunnel syndrome, right upper limb: Secondary | ICD-10-CM | POA: Diagnosis not present

## 2018-12-20 DIAGNOSIS — M79641 Pain in right hand: Secondary | ICD-10-CM | POA: Diagnosis not present

## 2018-12-22 ENCOUNTER — Encounter: Payer: Self-pay | Admitting: Gastroenterology

## 2018-12-27 DIAGNOSIS — G5603 Carpal tunnel syndrome, bilateral upper limbs: Secondary | ICD-10-CM | POA: Diagnosis not present

## 2018-12-31 DIAGNOSIS — Z20828 Contact with and (suspected) exposure to other viral communicable diseases: Secondary | ICD-10-CM | POA: Diagnosis not present

## 2018-12-31 DIAGNOSIS — R519 Headache, unspecified: Secondary | ICD-10-CM | POA: Diagnosis not present

## 2018-12-31 DIAGNOSIS — J029 Acute pharyngitis, unspecified: Secondary | ICD-10-CM | POA: Diagnosis not present

## 2019-01-07 DIAGNOSIS — I10 Essential (primary) hypertension: Secondary | ICD-10-CM | POA: Diagnosis not present

## 2019-01-07 DIAGNOSIS — Z87891 Personal history of nicotine dependence: Secondary | ICD-10-CM | POA: Diagnosis not present

## 2019-01-07 DIAGNOSIS — E785 Hyperlipidemia, unspecified: Secondary | ICD-10-CM | POA: Diagnosis not present

## 2019-01-07 DIAGNOSIS — J302 Other seasonal allergic rhinitis: Secondary | ICD-10-CM | POA: Diagnosis not present

## 2019-01-07 DIAGNOSIS — G5601 Carpal tunnel syndrome, right upper limb: Secondary | ICD-10-CM | POA: Diagnosis not present

## 2019-01-07 DIAGNOSIS — E119 Type 2 diabetes mellitus without complications: Secondary | ICD-10-CM | POA: Diagnosis not present

## 2019-01-07 DIAGNOSIS — M1990 Unspecified osteoarthritis, unspecified site: Secondary | ICD-10-CM | POA: Diagnosis not present

## 2019-01-26 ENCOUNTER — Other Ambulatory Visit: Payer: Self-pay

## 2019-01-26 DIAGNOSIS — Z853 Personal history of malignant neoplasm of breast: Secondary | ICD-10-CM | POA: Diagnosis not present

## 2019-03-11 ENCOUNTER — Other Ambulatory Visit: Payer: Self-pay | Admitting: Internal Medicine

## 2019-03-15 ENCOUNTER — Encounter: Payer: Self-pay | Admitting: Gastroenterology

## 2019-03-31 ENCOUNTER — Other Ambulatory Visit: Payer: Self-pay | Admitting: Internal Medicine

## 2019-04-01 ENCOUNTER — Other Ambulatory Visit: Payer: Self-pay | Admitting: Internal Medicine

## 2019-04-12 ENCOUNTER — Encounter: Payer: Medicare Other | Admitting: Gastroenterology

## 2019-07-12 ENCOUNTER — Telehealth: Payer: Self-pay | Admitting: Internal Medicine

## 2019-07-12 NOTE — Chronic Care Management (AMB) (Signed)
  Chronic Care Management   Outreach Note  07/12/2019 Name: [name] MRN: [id number] DOB: 05-31-1950  Referred by: Burnis Medin, MD Reason for referral : Chronic Care Management   An unsuccessful telephone outreach was attempted today. The patient was referred to the pharmacist for assistance with care management and care coordination.   Follow Up Plan:   [place]

## 2019-07-26 ENCOUNTER — Other Ambulatory Visit: Payer: Self-pay | Admitting: Internal Medicine

## 2019-07-27 ENCOUNTER — Telehealth: Payer: Self-pay | Admitting: Internal Medicine

## 2019-07-27 NOTE — Progress Notes (Signed)
  Chronic Care Management   Outreach Note  07/27/2019 Name: [name] MRN: [id number] DOB: 1950/10/29  Referred by: Burnis Medin, MD Reason for referral : No chief complaint on file.   A second unsuccessful telephone outreach was attempted today. The patient was referred to pharmacist for assistance with care management and care coordination.  This note is not being shared with the patient for the following reason: To respect privacy (The patient or proxy has requested that the information not be shared).   Follow Up Plan:   [place]

## 2019-08-08 ENCOUNTER — Other Ambulatory Visit: Payer: Self-pay | Admitting: Internal Medicine

## 2019-10-11 ENCOUNTER — Other Ambulatory Visit: Payer: Self-pay | Admitting: Internal Medicine

## 2019-11-07 ENCOUNTER — Other Ambulatory Visit: Payer: Self-pay | Admitting: Internal Medicine

## 2019-12-11 ENCOUNTER — Other Ambulatory Visit: Payer: Self-pay | Admitting: Internal Medicine

## 2019-12-29 ENCOUNTER — Other Ambulatory Visit: Payer: Self-pay | Admitting: Internal Medicine

## 2020-01-23 ENCOUNTER — Inpatient Hospital Stay: Payer: Medicare Other | Attending: Oncology | Admitting: Oncology

## 2020-01-23 DIAGNOSIS — C50211 Malignant neoplasm of upper-inner quadrant of right female breast: Secondary | ICD-10-CM

## 2020-03-26 ENCOUNTER — Other Ambulatory Visit: Payer: Self-pay | Admitting: Internal Medicine

## 2020-04-09 ENCOUNTER — Telehealth: Payer: Self-pay | Admitting: Internal Medicine

## 2020-04-09 NOTE — Telephone Encounter (Signed)
Pt called and stated that she is no longer a pt her at [place] she is see a provider in [place], [place]

## 2020-04-09 NOTE — Telephone Encounter (Signed)
Left message for patient to call back and schedule Medicare Annual Wellness Visit (AWV) either virtually or in office.  Did not leave detailed message   Last AWV no information  please schedule at anytime with [facility] Nurse Health Advisor 1 or 2   This should be a 45 minute visit. Patient also needs appointment with pcp last appointment 12/13/2018

## 2020-07-25 ENCOUNTER — Other Ambulatory Visit: Payer: Self-pay | Admitting: Internal Medicine

## 2020-08-15 ENCOUNTER — Other Ambulatory Visit: Payer: Self-pay | Admitting: Internal Medicine

## 2020-08-30 ENCOUNTER — Other Ambulatory Visit: Payer: Self-pay | Admitting: *Deleted

## 2020-08-30 DIAGNOSIS — R200 Anesthesia of skin: Secondary | ICD-10-CM

## 2020-09-05 ENCOUNTER — Other Ambulatory Visit: Payer: Self-pay

## 2020-09-05 ENCOUNTER — Ambulatory Visit (HOSPITAL_COMMUNITY)
Admission: RE | Admit: 2020-09-05 | Discharge: 2020-09-05 | Disposition: A | Discharge status: Home / Self Care | Payer: Medicare Other | Source: Ambulatory Visit | Attending: Vascular Surgery | Admitting: Vascular Surgery

## 2020-09-05 ENCOUNTER — Encounter: Payer: Self-pay | Admitting: Vascular Surgery

## 2020-09-05 ENCOUNTER — Ambulatory Visit: Payer: Medicare Other | Admitting: Vascular Surgery

## 2020-09-05 VITALS — BP 138/68 | HR 60 | Temp 97.7°F | Resp 16 | Ht 64.0 in | Wt 224.0 lb

## 2020-09-05 DIAGNOSIS — R202 Paresthesia of skin: Secondary | ICD-10-CM | POA: Diagnosis not present

## 2020-09-05 DIAGNOSIS — R200 Anesthesia of skin: Secondary | ICD-10-CM | POA: Diagnosis not present

## 2020-09-05 DIAGNOSIS — I83811 Varicose veins of right lower extremities with pain: Secondary | ICD-10-CM

## 2020-09-05 NOTE — Progress Notes (Signed)
Referring Physician: Dr. Marco Collie  Patient name: [name] MRN: [id number] DOB: 13-Feb-1951 Sex: female  REASON FOR CONSULT: Symptomatic varicose veins right leg with bleeding  HPI: [name] is a 69 y.o. female, who about 2 weeks ago had bleeding from a varicosity just above her Achilles tendon on the right leg.  This stopped with pressure but there was significant bleeding.  Patient has never had any bleeding episodes before.  She did have a prior vein procedure done on her left leg which sounds like a laser ablation.  She thinks she may have had 1 done on the right leg as well.  She has worn some compression stockings in the past but has not really worn any of these in the last few years.  She has some heaviness and aching in her legs which improves with elevation.  She works standing all day.  She has never had a history of DVT.  She has no family history of varicose veins.  Other medical problems include diabetes which she has had for several years.  He is a former smoker and quit several years ago.  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 11/22/2007   Anxiety    Arthritis    ARTHROPATHY NOS, HAND 11/30/2006   Cancer ([place])    right breast cancer   Cough due to ACE inhibitor 04/28/2011   Depression    Ganglion cyst    GERD (gastroesophageal reflux disease)    occ   HYPERTENSION 11/22/2007   echo card 2004 nl lv function    NECK PAIN 11/22/2007   Other specified disorder of skin 12/25/2008   PONV (postoperative nausea and vomiting)    TOBACCO USE, QUIT 12/25/2008   Urticaria    Past Surgical History:  Procedure Laterality Date   ABDOMINAL HYSTERECTOMY  1989   APPENDECTOMY     BREAST RECONSTRUCTION Left 12/01/2013   dr Harlow Mares   BREAST RECONSTRUCTION Left 12/01/2013   Procedure:  BREAST RECONSTRUCTION;  Surgeon: Crissie Reese, MD;  Location: [place];  Service: Plastics;  Laterality: Left;   BREAST  RECONSTRUCTION WITH PLACEMENT OF TISSUE EXPANDER AND FLEX HD (ACELLULAR HYDRATED DERMIS) Bilateral 03/10/2013   Procedure: PLACEMENT OF BILATERAL TISSUE EXPANDER WITH POSSIBLE FLEX HD (ACELLULAR HYDRATED DERMIS) FOR BREAST RECONSTRUCTION;  Surgeon: Crissie Reese, MD;  Location: [place];  Service: Plastics;  Laterality: Bilateral;   CHOLECYSTECTOMY  2/15   COLONOSCOPY     LAPAROSCOPIC APPENDECTOMY     MASTECTOMY COMPLETE / SIMPLE W/ SENTINEL NODE BIOPSY Bilateral    MASTECTOMY W/ SENTINEL NODE BIOPSY Bilateral 03/10/2013   Procedure: BILATERAL MASTECTOMY WITH RIGHT SENTINEL LYMPH NODE BIOPSY FOLLOWED BY RECONSTRUCTION;  Surgeon: Merrie Roof, MD;  Location: [place];  Service: General;  Laterality: Bilateral;   ORIF ANKLE FRACTURE Left 03/19/2017   [facility]     PORT-A-CATH REMOVAL Left 12/01/2014   Procedure: REMOVAL PORT-A-CATH;  Surgeon: Autumn Messing III, MD;  Location: [place];  Service: General;  Laterality: Left;   PORTACATH PLACEMENT Left 03/10/2013   Procedure: INSERTION PORT-A-CATH;  Surgeon: Merrie Roof, MD;  Location: [place];  Service: General;  Laterality: Left;   REMOVAL OF TISSUE EXPANDER AND PLACEMENT OF IMPLANT Left 05/05/2013   Procedure: REMOVAL OF LEFT BREAST TISSUE EXPANDER / incision and drainage of ceroma;  Surgeon: Crissie Reese, MD;  Location: [place];  Service: Plastics;  Laterality: Left;   SHOULDER  SURGERY     right shoulder scope, open RCR, biceps tendonesis 12/17/15   TISSUE EXPANDER PLACEMENT Left 12/01/2013   Procedure: TISSUE EXPANDER FOR DELAYED LEFT BREAST RECONSTRUCTION;  Surgeon: Crissie Reese, MD;  Location: [place];  Service: Plastics;  Laterality: Left;   WRIST SURGERY  2001    Family History  Problem Relation Age of Onset   Alzheimer's disease Mother    Hypertension Mother    Anxiety disorder Mother    Allergic rhinitis Mother    Diabetes Father    Alcoholism Father    Cancer Brother        Cancer at base of tongue   Diabetes Other         1st degree relative   Bladder Cancer Brother     SOCIAL HISTORY: Social History   Socioeconomic History   Marital status: Divorced    Spouse name: Not on file   Number of children: Not on file   Years of education: Not on file   Highest education level: Not on file  Occupational History   Occupation: Hairdresser  Tobacco Use   Smoking status: Former    Packs/day: 1.50    Years: 15.00    Pack years: 22.50    Types: Cigarettes    Quit date: 03/04/1987    Years since quitting: 33.5   Smokeless tobacco: Never  Substance and Sexual Activity   Alcohol use: No   Drug use: No   Sexual activity: Not on file  Other Topics Concern   Not on file  Social History Narrative   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    Former tobacco  No tad     Divorced.       Brother temp , son back in for temporarily.  People      Dog    Social Determinants of Health   Financial Resource Strain: Not on file  Food Insecurity: Not on file  Transportation Needs: Not on file  Physical Activity: Not on file  Stress: Not on file  Social Connections: Not on file  Intimate Partner Violence: Not on file    Allergies  Allergen Reactions   Ace Inhibitors Cough   Cephalexin Hives   Cephalosporins Hives    Possible allergy -- hives    Codeine Itching and Nausea Only   Tramadol Hives    Current Outpatient Medications  Medication Sig Dispense Refill   ALPRAZolam (XANAX) 0.25 MG tablet Take 1 tablet (0.25 mg total) by mouth 3 (three) times daily as needed for anxiety. 24 tablet 0   blood glucose meter kit and supplies KIT Dispense based on patient and insurance preference. Use up to four times daily as directed. (FOR ICD-9 250.00, 250.01). 1 each 0   FLUoxetine (PROZAC) 20 MG capsule TAKE 1 CAP DAILY. PLEASE SCHEDULE FOLLOW UP APPOINTMENT FOR REFILLS. [id number] 30 capsule 0   glucose blood test strip Use as instructed up to 3 times a day  100 each 12   loratadine (CLARITIN) 10 MG tablet TAKE 1 TABLET BY MOUTH EVERY DAY 90 tablet 1   metFORMIN (GLUCOPHAGE-XR) 500 MG 24 hr tablet TAKE 2 TABLETS BY MOUTH EVERY DAY 180 tablet 1   metoprolol tartrate (LOPRESSOR) 50 MG tablet TAKE 1/2 TABLET BY MOUTH TWICE A DAY 30 tablet 0   rosuvastatin (CRESTOR) 10 MG tablet TAKE 1 TABLET BY MOUTH 3 TIMES A WEEK FOR  A COUPLE WEEKS THEN INCREASE TO DAILY IF TOLERATED 90 tablet 1   No current facility-administered medications for this visit.    ROS:   General:  No weight loss, Fever, chills  HEENT: No recent headaches, no nasal bleeding, no visual changes, no sore throat  Neurologic: No dizziness, blackouts, seizures. No recent symptoms of stroke or mini- stroke. No recent episodes of slurred speech, or temporary blindness.  Cardiac: No recent episodes of chest pain/pressure, no shortness of breath at rest.  No shortness of breath with exertion.  Denies history of atrial fibrillation or irregular heartbeat  Vascular: No history of rest pain in feet.  No history of claudication.  No history of non-healing ulcer, No history of DVT   Pulmonary: No home oxygen, no productive cough, no hemoptysis,  No asthma or wheezing  Musculoskeletal:  [ ]  Arthritis, [ ]  Low back pain,  [ ]  Joint pain  Hematologic:No history of hypercoagulable state.  No history of easy bleeding.  No history of anemia  Gastrointestinal: No hematochezia or melena,  No gastroesophageal reflux, no trouble swallowing  Urinary: [ ]  chronic Kidney disease, [ ]  on HD - [ ]  MWF or [ ]  TTHS, [ ]  Burning with urination, [ ]  Frequent urination, [ ]  Difficulty urinating;   Skin: No rashes  Psychological: No history of anxiety,  No history of depression   Physical Examination Vitals:   09/05/20 1104  BP: 138/68  Pulse: 60  Resp: 16  Temp: 97.7 F (36.5 C)  TempSrc: Temporal  SpO2: 97%  Weight: 224 lb (101.6 kg)  Height: 5' 4"  (1.626 m)     General:  Alert and oriented,  no acute distress HEENT: Normal Neck: No JVD Cardiac: Regular Rate and Rhythm Skin: No rash, she is reticular type varicosities around the ankle and foot bilaterally.  A few larger bulging varicose veins in the right distal medial thigh and medial calf.  Scattered reticular type varicosities left leg. Extremity Pulses:  2+ dorsalis pedis pulses bilaterally Musculoskeletal: No deformity or edema  Neurologic: Upper and lower extremity motor 5/5 and symmetric  DATA:  Patient had a venous duplex exam for reflux which showed the left greater saphenous vein has been shut down.  There is diffuse reflux in the right greater saphenous vein with a 5 to 7 mm vein diameter.  There was no deep vein reflux.  ASSESSMENT: Symptomatic varicose veins with bleeding right leg.  Patient was counseled on how to elevate her leg and hold pressure if she has recurrent bleeding episodes.  She was given a prescription today for thigh-high compression stockings for symptomatic relief.   PLAN: We will follow-up in 3 months time for consideration of laser ablation of her right greater saphenous vein and possible stab avulsions.  She will call us sooner if she has an additional bleeding episode.   Ruta Hinds, MD [facility] Office: [phone]

## 2020-09-08 ENCOUNTER — Other Ambulatory Visit: Payer: Self-pay | Admitting: Internal Medicine

## 2020-12-05 ENCOUNTER — Other Ambulatory Visit: Payer: Self-pay

## 2020-12-05 ENCOUNTER — Ambulatory Visit: Payer: Medicare Other | Admitting: Vascular Surgery

## 2020-12-05 ENCOUNTER — Encounter: Payer: Self-pay | Admitting: Vascular Surgery

## 2020-12-05 VITALS — BP 159/73 | HR 72 | Temp 97.7°F | Resp 16 | Ht 64.0 in | Wt 230.1 lb

## 2020-12-05 DIAGNOSIS — I872 Venous insufficiency (chronic) (peripheral): Secondary | ICD-10-CM

## 2020-12-05 NOTE — Progress Notes (Signed)
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  MEDICAL ISSUES:   CHRONIC VENOUS INSUFFICIENCY: This patient has CEAP C4c venous disease.  She had a significant bleeding episode from the telangiectasias in her posterior right leg.  She she has superficial venous reflux in the right thigh involving a posterior accessory saphenous vein in the proximal right great saphenous vein however these are fairly short segments.  For this reason I am not sure that addressing the short segment of reflux in the thigh would significantly venous hypertension distally.  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  In addition we have discussed importance of intermittent leg elevation and the proper positioning for this.  I have encouraged her to continue to wear compression stockings.  A knee-high compression stocking with a gradient of 15 to 20 mmHg may be more practical.  I have encouraged her to avoid prolonged sitting and standing.  We discussed importance of exercise specifically walking and water aerobics.  We also discussed the importance of maintaining a healthy weight as abdominal obesity especially increases lower extremity venous pressure.  If her varicosities and venous insufficiency progresses then certainly she may be a candidate for laser ablation of the great saphenous vein in the future.   HPI:   [name] is a pleasant 70 y.o. female who was seen by Dr. Ruta Hinds on 09/05/2020 with varicose veins of the right leg.  About 2 weeks prior to this visit she had bleeding from a varicosity just above her Achilles tendon on the right leg.  She had significant bleeding.  She has had previous laser ablation of the left lower extremity reportedly.  Of note her job requires her to stand for long periods of time.  She has no previous  history of DVT.  On exam she had palpable dorsalis pedis pulses.  She has some varicose veins in the distal right medial thigh and medial calf.  She also had some scattered reticular veins.  Her noninvasive study showed diffuse reflux in the right great saphenous vein with diameters from 5 to 7 mm.  There was no deep venous reflux.  On my history the patient stands at work all day but has minimal symptoms in her legs.  She has some mild aching and heaviness.  She does elevate her legs which helps her symptoms.  She has been wearing her thigh-high compression stockings.  She has had no further bleeding episodes.  She has no previous history of DVT.  She is had previous laser ablation of the left great saphenous vein.  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 11/22/2007   Anxiety    Arthritis    ARTHROPATHY NOS, HAND 11/30/2006   Cancer ([place])    right breast cancer   Cough due to ACE inhibitor 04/28/2011   Depression    Ganglion cyst    GERD (gastroesophageal reflux disease)    occ   HYPERTENSION 11/22/2007   echo card 2004 nl lv function    NECK PAIN 11/22/2007   Other specified disorder of skin 12/25/2008   PONV (postoperative nausea and vomiting)    TOBACCO USE, QUIT 12/25/2008   Urticaria     Family History  Problem Relation Age of Onset   Alzheimer's disease Mother  Hypertension Mother    Anxiety disorder Mother    Allergic rhinitis Mother    Diabetes Father    Alcoholism Father    Cancer Brother        Cancer at base of tongue   Diabetes Other        1st degree relative   Bladder Cancer Brother     SOCIAL HISTORY: Social History   Tobacco Use   Smoking status: Former    Packs/day: 1.50    Years: 15.00    Pack years: 22.50    Types: Cigarettes    Quit date: 03/04/1987    Years since quitting: 33.7   Smokeless tobacco: Never  Substance Use Topics   Alcohol use: No    Allergies  Allergen Reactions   Ace  Inhibitors Cough   Cephalexin Hives   Cephalosporins Hives    Possible allergy -- hives    Codeine Itching and Nausea Only   Tramadol Hives    Current Outpatient Medications  Medication Sig Dispense Refill   ALPRAZolam (XANAX) 0.25 MG tablet Take 1 tablet (0.25 mg total) by mouth 3 (three) times daily as needed for anxiety. 24 tablet 0   blood glucose meter kit and supplies KIT Dispense based on patient and insurance preference. Use up to four times daily as directed. (FOR ICD-9 250.00, 250.01). 1 each 0   FLUoxetine (PROZAC) 20 MG capsule TAKE 1 CAP DAILY. PLEASE SCHEDULE FOLLOW UP APPOINTMENT FOR REFILLS. [phone] 30 capsule 0   glucose blood test strip Use as instructed up to 3 times a day 100 each 12   loratadine (CLARITIN) 10 MG tablet TAKE 1 TABLET BY MOUTH EVERY DAY 90 tablet 1   metFORMIN (GLUCOPHAGE-XR) 500 MG 24 hr tablet TAKE 2 TABLETS BY MOUTH EVERY DAY 180 tablet 1   metoprolol tartrate (LOPRESSOR) 50 MG tablet TAKE 1/2 TABLET BY MOUTH TWICE A DAY 30 tablet 0   rosuvastatin (CRESTOR) 10 MG tablet TAKE 1 TABLET BY MOUTH 3 TIMES A WEEK FOR A COUPLE WEEKS THEN INCREASE TO DAILY IF TOLERATED (Patient not taking: No sig reported) 90 tablet 1   No current facility-administered medications for this visit.    REVIEW OF SYSTEMS:  [X]  denotes positive finding, [ ]  denotes negative finding Cardiac  Comments:  Chest pain or chest pressure:    Shortness of breath upon exertion:    Short of breath when lying flat:    Irregular heart rhythm:        Vascular    Pain in calf, thigh, or hip brought on by ambulation:    Pain in feet at night that wakes you up from your sleep:     Blood clot in your veins:    Leg swelling:         Pulmonary    Oxygen at home:    Productive cough:     Wheezing:         Neurologic    Sudden weakness in arms or legs:     Sudden numbness in arms or legs:     Sudden onset of difficulty speaking or slurred speech:    Temporary loss of vision in  one eye:     Problems with dizziness:         Gastrointestinal    Blood in stool:     Vomited blood:         Genitourinary    Burning when urinating:     Blood in urine:  Psychiatric    Major depression:         Hematologic    Bleeding problems:    Problems with blood clotting too easily:        Skin    Rashes or ulcers:        Constitutional    Fever or chills:     PHYSICAL EXAM:   Vitals:   12/05/20 1339  BP: (!) 159/73  Pulse: 72  Resp: 16  Temp: 97.7 F (36.5 C)  SpO2: 96%  Weight: 230 lb 1.6 oz (104.4 kg)  Height: 5' 4"  (1.626 m)    GENERAL: The patient is a well-nourished female, in no acute distress. The vital signs are documented above. CARDIAC: There is a regular rate and rhythm.  VASCULAR: She has a palpable right posterior tibial pulse and left dorsalis pedis pulse. She has corona phlebectatica bilaterally but especially on the right as documented in the photograph below.   I did look at the right great saphenous vein myself with the Sono[employer].  The vein was dilated in the proximal thigh but below that the vein was fairly small.  There was a posterior sensory saphenous vein that was somewhat dilated but fairly short.  PULMONARY: There is good air exchange bilaterally without wheezing or rales. ABDOMEN: Soft and non-tender with normal pitched bowel sounds.  MUSCULOSKELETAL: There are no major deformities or cyanosis.  NEUROLOGIC: No focal weakness or paresthesias are detected. SKIN: There are no ulcers or rashes noted. PSYCHIATRIC: The patient has a normal affect.  DATA:    VENOUS DUPLEX: I have reviewed her venous duplex scan from 09/05/2020.  She had no evidence of DVT of the right leg.  She had no deep venous reflux on the right.  She had superficial venous reflux on the right from the saphenofemoral junction to the knee.  Diameters of the vein in the right thigh ranged from 5 to 7 mm.    Deitra Mayo [facility] [phone]

## 2021-01-14 ENCOUNTER — Ambulatory Visit: Payer: Medicare Other

## 2021-01-14 ENCOUNTER — Other Ambulatory Visit: Payer: Self-pay

## 2021-01-14 DIAGNOSIS — I83891 Varicose veins of right lower extremities with other complications: Secondary | ICD-10-CM

## 2021-01-14 DIAGNOSIS — I83899 Varicose veins of unspecified lower extremities with other complications: Secondary | ICD-10-CM

## 2021-01-14 NOTE — Progress Notes (Signed)
Treated pt's RLE spider and reticular veins with Asclera 1% administered with a 27g butterfly.  Patient received a total of 6cc. Pt tolerated very well. Easy access. Pt had a spot in back of R ankle that has bled in the past. This area treated and will hopefully improve bulging of spider/reticular veins. Pt was given post treatment care instructions both verbally and on handout. Will follow prn.  Photos: Yes.    Compression stockings applied: Yes.

## 2021-01-14 NOTE — Progress Notes (Signed)
[facility]  [address] [place],    [address] [phone]  Clinic Day:  01/21/2021  Referring physician: Nicoletta Dress, MD  This document serves as a record of services personally performed by Hosie Poisson, MD. It was created on their behalf by [facility], a trained medical scribe. The creation of this record is based on the scribe's personal observations and the provider's statements to them.  ASSESSMENT & PLAN:   Stage IA HER 2 positive, hormone receptor negative, right breast cancer diagnosed in November 2014, 8 years ago, who has no evidence of disease.  She was treated with surgery, partial chemotherapy and a full year of trastuzumab.    I do recommend that she undergo repeat bone density, so we will schedule this for her. Otherwise, as she is doing well, we will see her back in 1 year for reexamination.  She understands and agrees with this plan of care.  I provided 15 minutes of face-to-face time during this this encounter and > 50% was spent counseling as documented under my assessment and plan.    Derwood Kaplan, MD [facility] [address] [place] [address] Dept: [phone] Dept Fax: [phone]    CHIEF COMPLAINT:  CC: History of stage IA HER2 receptor positive right breast cancer  Current Treatment:  Surveillance   HISTORY OF PRESENT ILLNESS:  [name] is a 70 y.o. female with a history of stage IA (T1c N0 M0) HER 2 Neu receptor positive right breast cancer diagnosed in November of 2014.  She had multifocal disease on MRI scan, so opted for bilateral mastectomies with reconstruction, which was done in January 2015.  Pathology revealed multifocal, grade 3, invasive ductal carcinoma of the right breast with the largest lesion measuring 1.7 cm with negative nodes.  Estrogen and progesterone receptors were negative and HER 2  Neu positive.  Ki 67 was 57%.  Adjuvant chemotherapy with docetaxel, carboplatin and trastuzumab was recommended.  She received 2 cycles, but had severe toxicities and was hospitalized after each cycle.  We switched her to weekly paclitaxel with trastuzumab, but she still had significant toxicities with severe skin rash.  We then substituted Abraxane for the paclitaxel, and she did not tolerate this regimen either, so chemotherapy was discontinued.  She did complete a full year of trastuzumab.  Her last echocardiogram in January of 2016 remained normal.    INTERVAL HISTORY:  I have reviewed her chart and materials related to her cancer extensively and collaborated history with the patient. Summary of oncologic history is as follows: Oncology History  Breast cancer, right breast ([place])  12/27/2012 Cancer Staging   Staging form: Breast, AJCC 7th Edition - Clinical stage from 12/27/2012: Stage IA (T1c, N0, M0) - Signed by Derwood Kaplan, MD on 01/22/2020 Prognostic indicators: 4 foci, up to 1.7 cm, pos. LVI    12/27/2012 Pathology Results   Breast, right, needle core biopsy:  -  Invasive ductal carcinoma HER2: Amplification  Ratio of HER2: cep 17 signals: 5.11  Average HER2 copy #/cell: 6.9 ER: Negative 0% PR: Negative 0% Ki67: 57%    01/04/2013 Breast MRI   1. A 1.6 x 1.5 x 1.5 cm irregular enhancing mass in the upper inner  right breast, corresponding with the biopsy-proven invasive ductal  carcinoma.   2. Large area of non mass enhancement surrounding the biopsied  invasive ductal carcinoma in the upper inner right  breast. In  aggregate, the mass and surrounding non mass enhancement measure 5 x  5 x 2.6 cm.   3. Nonmass enhancement in the anterior upper slightly outer right  breast. The anterior margin of this area of enhancement and the  posterior margin of the non mass enhancement surrounding the  biopsied carcinoma spans 7.1 cm.   4. Two additional masses in the inner  right breast, one at the level  of the nipple measuring up to 6 mm and one in the inferior breast  measuring up to 6 mm.    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   Breast, right,needle core biopsy, inner central:  -  Invasive ductal carcinoma  -  Ductal carcinoma in situ HER2: Positive ER: Negative 0% PR: Negative 0% Ki67: 13%    02/01/2013 Initial Diagnosis   Breast cancer, right breast ([place])   03/10/2013 Pathology Results   Lymph node, sentinel, biopsy, right axilla #1:  There is no evidence of carcinoma in 1 of 1 lymph node (0/1) Lymph node, sentinel, biopsy, right axilla #2:  There is no evidence of carcinoma in 1 of 1 lymph node (0/1) Breast, simple mastectomy, left:  Fibrocystic changes with calcifications   One benign lymph node (0/1)  There is no evidence of malignancy Breast, simple mastectomy, right:  Invasive ductal carcinoma, grade 3, multiple foci, the largest spans 1.7 cm  Ductal carcinoma in situ, high grade  Lymphovascular invasion is identified  The surgical resection margins are negative for carcinoma Skin, additional margins, left:  Benign skin  There is no evidence of malignancy Skin, additional margins, right:  Benign skin  There is no evidence of malignancy    04/2013 - 06/2013 Chemotherapy   Docetaxel, carboplatin and trastuzumab was recommended.  She received 2 cycles, but had severe toxicities and was hospitalized after each cycle.  We switched her to weekly paclitaxel with trastuzumab, but she still had significant toxicities with severe skin rash.  We then substituted Abraxane for the paclitaxel, and she did not tolerate this regimen either, so chemotherapy was discontinued.     04/2013 - 04/2014 Chemotherapy   She did complete a full year of trastuzumab     [name] is here for annual follow up and states that she has been doing well and denies complaints. She  continues to work part time. She undergoes routine lab work with Dr. Delena Bali. She has not had a bone density scan since 2014, but that was normal. Her  appetite is good, and she has lost 1 1/2 pounds since her last visit.  She denies fever, chills or other signs of infection.  She denies nausea, vomiting, bowel issues, or abdominal pain.  She denies sore throat, cough, dyspnea, or chest pain.  HISTORY:   Allergies:  Allergies  Allergen Reactions   Ace Inhibitors Cough   Cephalexin Hives   Cephalosporins Hives    Possible allergy -- hives    Codeine Itching and Nausea Only   Tramadol Hives    Current Medications: Current Outpatient Medications  Medication Sig Dispense Refill   FLUoxetine (PROZAC) 20 MG capsule Take by mouth.     triamterene-hydrochlorothiazide (MAXZIDE-25) 37.5-25 MG tablet      ALPRAZolam (XANAX) 0.25 MG tablet Take 1 tablet (0.25 mg total) by mouth 3 (three) times daily as needed for anxiety. 24 tablet 0   B Complex Vitamins (VITAMIN B COMPLEX) TABS Take  1 tablet by mouth daily.     blood glucose meter kit and supplies KIT Dispense based on patient and insurance preference. Use up to four times daily as directed. (FOR ICD-9 250.00, 250.01). 1 each 0   glucose blood test strip Use as instructed up to 3 times a day 100 each 12   loratadine (CLARITIN) 10 MG tablet TAKE 1 TABLET BY MOUTH EVERY DAY 90 tablet 1   losartan (COZAAR) 25 MG tablet Take 25 mg by mouth daily.     metoprolol tartrate (LOPRESSOR) 50 MG tablet TAKE 1/2 TABLET BY MOUTH TWICE A DAY 30 tablet 0   OZEMPIC, 0.25 OR 0.5 MG/DOSE, 2 MG/1.5ML SOPN Apply topically.     rosuvastatin (CRESTOR) 10 MG tablet TAKE 1 TABLET BY MOUTH 3 TIMES A WEEK FOR A COUPLE WEEKS THEN INCREASE TO DAILY IF TOLERATED (Patient not taking: Reported on 09/05/2020) 90 tablet 1   No current facility-administered medications for this visit.    REVIEW OF SYSTEMS:  Review of Systems  Constitutional: Negative.  Negative for appetite  change, chills, fatigue, fever and unexpected weight change.  HENT:  Negative.    Eyes: Negative.   Respiratory: Negative.  Negative for chest tightness, cough, hemoptysis, shortness of breath and wheezing.   Cardiovascular: Negative.  Negative for chest pain, leg swelling and palpitations.  Gastrointestinal: Negative.  Negative for abdominal distention, abdominal pain, blood in stool, constipation, diarrhea, nausea and vomiting.  Endocrine: Negative.   Genitourinary: Negative.  Negative for difficulty urinating, dysuria, frequency and hematuria.   Musculoskeletal: Negative.  Negative for arthralgias, back pain, flank pain, gait problem and myalgias.  Skin: Negative.   Neurological: Negative.  Negative for dizziness, extremity weakness, gait problem, headaches, light-headedness, numbness, seizures and speech difficulty.  Hematological: Negative.   Psychiatric/Behavioral: Negative.  Negative for depression and sleep disturbance. The patient is not nervous/anxious.      VITALS:  Blood pressure (!) 170/85, pulse 68, temperature 98.1 F (36.7 C), temperature source Oral, resp. rate 18, height _0  (1.626 m), weight 228 lb 11.2 oz (103.7 kg), SpO2 96 %.  Wt Readings from Last 3 Encounters:  01/21/21 228 lb 11.2 oz (103.7 kg)  12/05/20 230 lb 1.6 oz (104.4 kg)  09/05/20 224 lb (101.6 kg)    Body mass index is 39.26 kg/m.  Performance status (ECOG): 0 - Asymptomatic  PHYSICAL EXAM:  Physical Exam Constitutional:      General: She is not in acute distress.    Appearance: Normal appearance. She is normal weight.  HENT:     Head: Normocephalic and atraumatic.  Eyes:     General: No scleral icterus.    Extraocular Movements: Extraocular movements intact.     Conjunctiva/sclera: Conjunctivae normal.     Pupils: Pupils are equal, round, and reactive to light.  Cardiovascular:     Rate and Rhythm: Normal rate and regular rhythm.     Pulses: Normal pulses.     Heart sounds: Normal heart  sounds. No murmur heard.   No friction rub. No gallop.  Pulmonary:     Effort: Pulmonary effort is normal. No respiratory distress.     Breath sounds: Normal breath sounds.  Chest:     Comments: Bilateral reconstructions are negative. Abdominal:     General: Bowel sounds are normal. There is no distension.     Palpations: Abdomen is soft. There is no hepatomegaly, splenomegaly or mass.     Tenderness: There is no abdominal tenderness.  Musculoskeletal:  General: Normal range of motion.     Cervical back: Normal range of motion and neck supple.     Right lower leg: No edema.     Left lower leg: No edema.  Lymphadenopathy:     Cervical: No cervical adenopathy.  Skin:    General: Skin is warm and dry.  Neurological:     General: No focal deficit present.     Mental Status: She is alert and oriented to person, place, and time. Mental status is at baseline.  Psychiatric:        Mood and Affect: Mood normal.        Behavior: Behavior normal.        Thought Content: Thought content normal.        Judgment: Judgment normal.     LABS:   CBC Latest Ref Rng & Units 09/01/2018 05/04/2017 03/09/2017  WBC 4.0 - 10.5 K/uL 5.1 5.7 6.4  Hemoglobin 12.0 - 15.0 g/dL 14.4 14.4 14.4  Hematocrit 36.0 - 46.0 % 43.1 41.6 42.9  Platelets 150.0 - 400.0 K/uL 164.0 182.0 174.0   CMP Latest Ref Rng & Units 09/01/2018 06/10/2017 05/04/2017  Glucose 70 - 99 mg/dL 140(H) 109(H) 127(H)  BUN 6 - 23 mg/dL _0 Creatinine 0.40 - 1.20 mg/dL 0.81 0.85 0.90  Sodium 135 - 145 mEq/L 139 141 141  Potassium 3.5 - 5.1 mEq/L 4.7 3.7 3.4(L)  Chloride 96 - 112 mEq/L 102 101 100  CO2 19 - 32 mEq/L _1 Calcium 8.4 - 10.5 mg/dL 9.5 9.6 9.8  Total Protein 6.0 - 8.3 g/dL 6.7 - -  Total Bilirubin 0.2 - 1.2 mg/dL 1.0 - -  Alkaline Phos 39 - 117 U/L 71 - -  AST 0 - 37 U/L 25 - -  ALT 0 - 35 U/L 25 - -     STUDIES:  No results found.    I, Rita Ohara, am acting as scribe for Derwood Kaplan, MD  I  have reviewed this report as typed by the medical scribe, and it is complete and accurate.

## 2021-01-21 ENCOUNTER — Inpatient Hospital Stay: Payer: Medicare Other | Attending: Oncology | Admitting: Oncology

## 2021-01-21 ENCOUNTER — Other Ambulatory Visit: Payer: Self-pay

## 2021-01-21 ENCOUNTER — Telehealth: Payer: Self-pay | Admitting: Oncology

## 2021-01-21 ENCOUNTER — Encounter: Payer: Self-pay | Admitting: Oncology

## 2021-01-21 ENCOUNTER — Other Ambulatory Visit: Payer: Self-pay | Admitting: Oncology

## 2021-01-21 VITALS — BP 170/85 | HR 68 | Temp 98.1°F | Resp 18 | Ht 64.0 in | Wt 228.7 lb

## 2021-01-21 DIAGNOSIS — C50211 Malignant neoplasm of upper-inner quadrant of right female breast: Secondary | ICD-10-CM

## 2021-01-21 DIAGNOSIS — Z171 Estrogen receptor negative status [ER-]: Secondary | ICD-10-CM | POA: Diagnosis not present

## 2021-01-21 NOTE — Telephone Encounter (Signed)
Per 11/21 los next appt scheduled and given to patient 

## 2021-04-26 ENCOUNTER — Encounter: Payer: Self-pay | Admitting: Oncology

## 2021-05-06 ENCOUNTER — Telehealth: Payer: Self-pay

## 2021-05-06 NOTE — Telephone Encounter (Signed)
Pt given an appt for 05/09/2021 @ 1630. ?

## 2021-05-06 NOTE — Telephone Encounter (Signed)
-----   Message from Derwood Kaplan, MD sent at 04/25/2021 11:00 AM EST ----- ?Regarding: call ?Tell her bone density does show some worsening and now has osteopenia of the hip and forearm. ?I would rec calcium with vitamin D bid but can be constipating. Her risk of a hip fx in the next 10 years is 5%, but any fx within the next 10 years is 25%.  Let's send copy to her PCP to see if they rec same ? ?

## 2021-05-06 NOTE — Telephone Encounter (Signed)
Patient notified

## 2021-05-08 NOTE — Progress Notes (Signed)
[place]  [address] [place],  [place]  [address] [phone]  Clinic Day:  05/09/2021  Referring physician: Nicoletta Dress, MD  This document serves as a record of services personally performed by Hosie Poisson, MD. It was created on their behalf by [facility] E, a trained medical scribe. The creation of this record is based on the scribe's personal observations and the provider's statements to them.  ASSESSMENT & PLAN:   Stage IA HER 2 positive, hormone receptor negative, right breast cancer diagnosed in November 2014, 8 years ago, who has no evidence of disease.  She was treated with surgery, partial chemotherapy and a full year of trastuzumab.   Osteopenia. At this time, she is only on a multivitamin, and so I advised that she start oral calcium and vitamin D supplements.    New onset left breast pain. She describes this as a burning sensation. She did do some heavy lifting about 1 month ago, but this has persisted. We will obtain CT chest for further evaluation.  Aching of the lower left ribcage with some point tenderness on palpation.   With her new onset left breast pain and high risk for recurrence, I will schedule her for CT chest for completeness. I will plan to contact her with the results, and if there is anything suspicious, I will bring her back for further discussion. If all is well, she will keep her routine follow up as scheduled. She understands and agrees with this plan of care.  I provided 15 minutes of face-to-face time during this this encounter and > 50% was spent counseling as documented under my assessment and plan.    Derwood Kaplan, MD [place] [address] [place] [address] Dept: [phone] Dept Fax: [phone]    CHIEF COMPLAINT:  CC: History of stage IA HER2 receptor positive right breast cancer  Current Treatment:   Surveillance   HISTORY OF PRESENT ILLNESS:  [name] is a 71 y.o. female with a history of stage IA (T1c N0 M0) HER 2 Neu receptor positive right breast cancer diagnosed in November of 2014.  She had multifocal disease on MRI scan, so opted for bilateral mastectomies with reconstruction, which was done in January 2015.  Pathology revealed multifocal, grade 3, invasive ductal carcinoma of the right breast with the largest lesion measuring 1.7 cm with negative nodes.  Estrogen and progesterone receptors were negative and HER 2 Neu positive.  Ki 67 was 57%.  Adjuvant chemotherapy with docetaxel, carboplatin and trastuzumab was recommended.  She received 2 cycles, but had severe toxicities and was hospitalized after each cycle.  We switched her to weekly paclitaxel with trastuzumab, but she still had significant toxicities with severe skin rash.  We then substituted Abraxane for the paclitaxel, and she did not tolerate this regimen either, so chemotherapy was discontinued.  She did complete a full year of trastuzumab.  Her last echocardiogram in January of 2016 remained normal.    INTERVAL HISTORY:  I have reviewed her chart and materials related to her cancer extensively and collaborated history with the patient. Summary of oncologic history is as follows: Oncology History  Breast cancer, right breast ([place])  12/27/2012 Cancer Staging   Staging form: Breast, AJCC 7th Edition - Clinical stage from 12/27/2012: Stage IA (T1c, N0, M0) - Signed by Derwood Kaplan, MD on 01/22/2020 Prognostic indicators: 4 foci, up to 1.7 cm, pos. LVI  12/27/2012 Pathology Results   Breast, right, needle core biopsy:  -  Invasive ductal carcinoma HER2: Amplification  Ratio of HER2: cep 17 signals: 5.11  Average HER2 copy #/cell: 6.9 ER: Negative 0% PR: Negative 0% Ki67: 57%    01/04/2013 Breast MRI   1. A 1.6 x 1.5 x 1.5 cm irregular enhancing mass in the upper inner  right breast, corresponding  with the biopsy-proven invasive ductal  carcinoma.   2. Large area of non mass enhancement surrounding the biopsied  invasive ductal carcinoma in the upper inner right breast. In  aggregate, the mass and surrounding non mass enhancement measure 5 x  5 x 2.6 cm.   3. Nonmass enhancement in the anterior upper slightly outer right  breast. The anterior margin of this area of enhancement and the  posterior margin of the non mass enhancement surrounding the  biopsied carcinoma spans 7.1 cm.   4. Two additional masses in the inner right breast, one at the level  of the nipple measuring up to 6 mm and one in the inferior breast  measuring up to 6 mm.    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   Breast, right,needle core biopsy, inner central:  -  Invasive ductal carcinoma  -  Ductal carcinoma in situ HER2: Positive ER: Negative 0% PR: Negative 0% Ki67: 13%    02/01/2013 Initial Diagnosis   Breast cancer, right breast ([place])   03/10/2013 Pathology Results   Lymph node, sentinel, biopsy, right axilla #1:  There is no evidence of carcinoma in 1 of 1 lymph node (0/1) Lymph node, sentinel, biopsy, right axilla #2:  There is no evidence of carcinoma in 1 of 1 lymph node (0/1) Breast, simple mastectomy, left:  Fibrocystic changes with calcifications   One benign lymph node (0/1)  There is no evidence of malignancy Breast, simple mastectomy, right:  Invasive ductal carcinoma, grade 3, multiple foci, the largest spans 1.7 cm  Ductal carcinoma in situ, high grade  Lymphovascular invasion is identified  The surgical resection margins are negative for carcinoma Skin, additional margins, left:  Benign skin  There is no evidence of malignancy Skin, additional margins, right:  Benign skin  There is no evidence of malignancy    04/2013 - 06/2013 Chemotherapy   Docetaxel, carboplatin and trastuzumab was  recommended.  She received 2 cycles, but had severe toxicities and was hospitalized after each cycle.  We switched her to weekly paclitaxel with trastuzumab, but she still had significant toxicities with severe skin rash.  We then substituted Abraxane for the paclitaxel, and she did not tolerate this regimen either, so chemotherapy was discontinued.     04/2013 - 04/2014 Chemotherapy   She did complete a full year of trastuzumab     [name] was added on to the schedule today due to new onset left breast pain. This began about 1 month ago and she describes a burning/aching sensation underneath the breast. She did do some heavy lifting around that time. She originally had pain around the left ribcage radiating to the back. X-ray imaging of the spine was negative other than degenerative changes. She was prescribed a muscle relaxer to use as needed with improvement. However, the left breast pain has persisted. She rates her pain as a 5/10 today. Bone density from February revealed osteopenia of the left femur with a T-score of -1.9, previously -0.8. Dual femur total mean  measures -0.5. Left forearm measures -1.4. Her  appetite is good, and her weight is stable since her last visit.  She denies fever, chills or other signs of infection.  She denies nausea, vomiting, bowel issues, or abdominal pain.  She denies sore throat, cough, dyspnea, or chest pain.  HISTORY:   Allergies:  Allergies  Allergen Reactions   Ace Inhibitors Cough   Cephalexin Hives   Cephalosporins Hives    Possible allergy -- hives    Codeine Itching and Nausea Only   Tramadol Hives    Current Medications: Current Outpatient Medications  Medication Sig Dispense Refill   ALPRAZolam (XANAX) 0.25 MG tablet Take 1 tablet (0.25 mg total) by mouth 3 (three) times daily as needed for anxiety. 24 tablet 0   B Complex Vitamins (VITAMIN B COMPLEX) TABS Take 1 tablet by mouth daily.     blood glucose meter kit and supplies KIT Dispense  based on patient and insurance preference. Use up to four times daily as directed. (FOR ICD-9 250.00, 250.01). 1 each 0   FLUoxetine (PROZAC) 20 MG capsule Take by mouth.     glucose blood test strip Use as instructed up to 3 times a day 100 each 12   loratadine (CLARITIN) 10 MG tablet TAKE 1 TABLET BY MOUTH EVERY DAY 90 tablet 1   losartan (COZAAR) 25 MG tablet Take 25 mg by mouth daily.     metoprolol tartrate (LOPRESSOR) 50 MG tablet TAKE 1/2 TABLET BY MOUTH TWICE A DAY 30 tablet 0   OZEMPIC, 0.25 OR 0.5 MG/DOSE, 2 MG/1.5ML SOPN Inject into the skin.     rosuvastatin (CRESTOR) 10 MG tablet TAKE 1 TABLET BY MOUTH 3 TIMES A WEEK FOR A COUPLE WEEKS THEN INCREASE TO DAILY IF TOLERATED (Patient taking differently: Take 5 mg by mouth daily.) 90 tablet 1   triamterene-hydrochlorothiazide (MAXZIDE-25) 37.5-25 MG tablet      No current facility-administered medications for this visit.    REVIEW OF SYSTEMS:  Review of Systems  Constitutional: Negative.  Negative for appetite change, chills, fatigue, fever and unexpected weight change.  HENT:  Negative.    Eyes: Negative.   Respiratory: Negative.  Negative for chest tightness, cough, hemoptysis, shortness of breath and wheezing.   Cardiovascular: Negative.  Negative for chest pain, leg swelling and palpitations.  Gastrointestinal: Negative.  Negative for abdominal distention, abdominal pain, blood in stool, constipation, diarrhea, nausea and vomiting.  Endocrine: Negative.   Genitourinary: Negative.  Negative for difficulty urinating, dysuria, frequency and hematuria.   Musculoskeletal: Negative.  Negative for arthralgias, back pain, flank pain, gait problem and myalgias.       Pain underneath the left breast.   Skin: Negative.   Neurological: Negative.  Negative for dizziness, extremity weakness, gait problem, headaches, light-headedness, numbness, seizures and speech difficulty.  Hematological: Negative.   Psychiatric/Behavioral: Negative.   Negative for depression and sleep disturbance. The patient is not nervous/anxious.      VITALS:  Blood pressure (!) 156/78, pulse 79, temperature 97.7 F (36.5 C), temperature source Oral, resp. rate 17, height 5' 4"  (1.626 m), weight 228 lb 11.2 oz (103.7 kg), SpO2 97 %.  Wt Readings from Last 3 Encounters:  05/09/21 228 lb 11.2 oz (103.7 kg)  01/21/21 228 lb 11.2 oz (103.7 kg)  12/05/20 230 lb 1.6 oz (104.4 kg)    Body mass index is 39.26 kg/m.  Performance status (ECOG): 1 - Symptomatic but completely ambulatory  PHYSICAL EXAM:  Physical Exam Constitutional:  General: She is not in acute distress.    Appearance: Normal appearance. She is normal weight.  HENT:     Head: Normocephalic and atraumatic.  Eyes:     General: No scleral icterus.    Extraocular Movements: Extraocular movements intact.     Conjunctiva/sclera: Conjunctivae normal.     Pupils: Pupils are equal, round, and reactive to light.  Cardiovascular:     Rate and Rhythm: Normal rate and regular rhythm.     Pulses: Normal pulses.     Heart sounds: Normal heart sounds. No murmur heard.   No friction rub. No gallop.  Pulmonary:     Effort: Pulmonary effort is normal. No respiratory distress.     Breath sounds: Normal breath sounds.  Chest:     Comments: Bilateral reconstructions are negative. Point tenderness along the left lateral lower ribcage. No other bone tenderness.  Abdominal:     General: Bowel sounds are normal. There is no distension.     Palpations: Abdomen is soft. There is no hepatomegaly, splenomegaly or mass.     Tenderness: There is no abdominal tenderness.  Musculoskeletal:        General: Normal range of motion.     Cervical back: Normal range of motion and neck supple.     Right lower leg: No edema.     Left lower leg: No edema.  Lymphadenopathy:     Cervical: No cervical adenopathy.  Skin:    General: Skin is warm and dry.  Neurological:     General: No focal deficit present.      Mental Status: She is alert and oriented to person, place, and time. Mental status is at baseline.  Psychiatric:        Mood and Affect: Mood normal.        Behavior: Behavior normal.        Thought Content: Thought content normal.        Judgment: Judgment normal.    LABS:   CBC Latest Ref Rng & Units 09/01/2018 05/04/2017 03/09/2017  WBC 4.0 - 10.5 K/uL 5.1 5.7 6.4  Hemoglobin 12.0 - 15.0 g/dL 14.4 14.4 14.4  Hematocrit 36.0 - 46.0 % 43.1 41.6 42.9  Platelets 150.0 - 400.0 K/uL 164.0 182.0 174.0   CMP Latest Ref Rng & Units 09/01/2018 06/10/2017 05/04/2017  Glucose 70 - 99 mg/dL 140(H) 109(H) 127(H)  BUN 6 - 23 mg/dL 19 22 21   Creatinine 0.40 - 1.20 mg/dL 0.81 0.85 0.90  Sodium 135 - 145 mEq/L 139 141 141  Potassium 3.5 - 5.1 mEq/L 4.7 3.7 3.4(L)  Chloride 96 - 112 mEq/L 102 101 100  CO2 19 - 32 mEq/L 31 31 31   Calcium 8.4 - 10.5 mg/dL 9.5 9.6 9.8  Total Protein 6.0 - 8.3 g/dL 6.7 - -  Total Bilirubin 0.2 - 1.2 mg/dL 1.0 - -  Alkaline Phos 39 - 117 U/L 71 - -  AST 0 - 37 U/L 25 - -  ALT 0 - 35 U/L 25 - -     STUDIES:  No results found.    EXAM: 04/24/2021 DUAL X-RAY ABSORPTIOMETRY (DXA) FOR BONE MINERAL DENSITY   IMPRESSION:  [name] completed a BMD test on 04/24/2021 using the  [employer] (analysis version: 13.60) manufactured by [employer]. The following summarizes the results of our evaluation.  Technologist: [facility]  PATIENT BIOGRAPHICAL:  Name: [name], [name]  Patient ID: [id number] [place] Birth Date: 1950/11/26 Height: 64.0 in.  Gender: Female Exam  Date: 04/24/2021 Weight: 229.0 lbs.  Indications: Glucocorticoids (Chronic), History of Fracture (Adult)  Fractures: Treatments:   ASSESSMENT:  The BMD measured at Femur Neck Right is 0.772 g/cm2 with a T-score  of -1.9. This patient is considered osteopenic according to [facility]) criteria.  The scan quality is good.  Lumbar spine was not utilized due to degenerative  changes.  Site Region Measured Measured WHO Young Adult BMD  Date Age Classification T-score   DualFemur Neck Right 04/24/2021 70.5 Osteopenia -1.9 0.772 g/cm2  DualFemur Neck Right 01/26/2013 62.2 Normal -0.8 0.929 g/cm2   DualFemur Total Mean 04/24/2021 70.5 Normal -0.5 0.943 g/cm2  DualFemur Total Mean 01/26/2013 62.2 Normal 0.5 1.071 g/cm2   Right Forearm Radius 33% 04/24/2021 70.5 Osteopenia -1.4 0.758 g/cm2    I, Rita Ohara, am acting as scribe for Derwood Kaplan, MD  I have reviewed this report as typed by the medical scribe, and it is complete and accurate.

## 2021-05-09 ENCOUNTER — Inpatient Hospital Stay: Payer: Medicare Other | Attending: Oncology | Admitting: Oncology

## 2021-05-09 ENCOUNTER — Encounter: Payer: Self-pay | Admitting: Oncology

## 2021-05-09 ENCOUNTER — Other Ambulatory Visit: Payer: Self-pay | Admitting: Oncology

## 2021-05-09 ENCOUNTER — Other Ambulatory Visit: Payer: Self-pay

## 2021-05-09 VITALS — BP 156/78 | HR 79 | Temp 97.7°F | Resp 17 | Ht 64.0 in | Wt 228.7 lb

## 2021-05-09 DIAGNOSIS — Z780 Asymptomatic menopausal state: Secondary | ICD-10-CM

## 2021-05-09 DIAGNOSIS — C50211 Malignant neoplasm of upper-inner quadrant of right female breast: Secondary | ICD-10-CM

## 2021-05-09 DIAGNOSIS — M8580 Other specified disorders of bone density and structure, unspecified site: Secondary | ICD-10-CM | POA: Diagnosis not present

## 2021-05-09 DIAGNOSIS — Z171 Estrogen receptor negative status [ER-]: Secondary | ICD-10-CM

## 2021-05-13 ENCOUNTER — Encounter: Payer: Self-pay | Admitting: Oncology

## 2021-05-13 DIAGNOSIS — M8580 Other specified disorders of bone density and structure, unspecified site: Secondary | ICD-10-CM | POA: Insufficient documentation

## 2021-05-13 DIAGNOSIS — Z780 Asymptomatic menopausal state: Secondary | ICD-10-CM | POA: Insufficient documentation

## 2021-05-15 LAB — HEPATIC FUNCTION PANEL
ALT: 27 U/L (ref 7–35)
AST: 40 — AB (ref 13–35)
Alkaline Phosphatase: 72 (ref 25–125)
Bilirubin, Total: 1.2

## 2021-05-15 LAB — CBC: RBC: 5.26 — AB (ref 3.87–5.11)

## 2021-05-15 LAB — CBC AND DIFFERENTIAL
HCT: 46 (ref 36–46)
Hemoglobin: 14.8 (ref 12.0–16.0)
Neutrophils Absolute: 3.19
Platelets: 135 10*3/uL — AB (ref 150–400)
WBC: 4.9

## 2021-05-15 LAB — BASIC METABOLIC PANEL
BUN: 20 (ref 4–21)
CO2: 28 — AB (ref 13–22)
Chloride: 105 (ref 99–108)
Creatinine: 0.8 (ref 0.5–1.1)
Glucose: 127
Potassium: 3.8 mEq/L (ref 3.5–5.1)
Sodium: 138 (ref 137–147)

## 2021-05-15 LAB — COMPREHENSIVE METABOLIC PANEL
Albumin: 4.1 (ref 3.5–5.0)
Calcium: 9 (ref 8.7–10.7)

## 2021-05-22 ENCOUNTER — Telehealth: Payer: Self-pay

## 2021-05-22 NOTE — Telephone Encounter (Addendum)
05/22/21@ 1034- Pt notified of Dr Remi Deter respoinse below. She verbalized understanding. ? ?FW: call ?Received: Today ?Belva Chimes, LPN  Dairl Ponder, RN ?  ?   ?Previous Messages ?  ?----- Message -----  ?From: Derwood Kaplan, MD  ?Sent: 05/16/2021   5:18 PM EDT  ?To: Belva Chimes, LPN  ?Subject: call                                          ? ?Let her know CT is clear, she has a small hiatal hernia.  The labs look great  ? ? RE: CT scan results ?Received: Today ?Derwood Kaplan, MD  Dairl Ponder, RN ?I am sorry, I had the report in my hand and thought I sent message to someone to call her.  It is all normal.   ?  ?   ? ? ?Pt called this morning req results of CT scan from last week. I sent Dr Hinton Rao the message @ 0933-awc ?

## 2022-01-21 ENCOUNTER — Ambulatory Visit: Payer: Medicare Other | Admitting: Oncology

## 2022-01-29 ENCOUNTER — Encounter: Payer: Self-pay | Admitting: Oncology

## 2022-01-29 ENCOUNTER — Inpatient Hospital Stay: Payer: Medicare Other | Attending: Oncology | Admitting: Oncology

## 2022-01-29 VITALS — BP 165/74 | HR 76 | Temp 97.5°F | Resp 18 | Ht 64.0 in | Wt 230.7 lb

## 2022-01-29 DIAGNOSIS — Z853 Personal history of malignant neoplasm of breast: Secondary | ICD-10-CM

## 2022-02-16 NOTE — Progress Notes (Signed)
[facility]  [address] [place],  [place]  [address] [phone]  Clinic Day:  01/29/22  Referring physician: Nicoletta Dress, MD  ASSESSMENT & PLAN:   Stage IA HER 2 positive, hormone receptor negative, right breast cancer diagnosed in November 2014, 8 years ago, who has no evidence of disease.  She was treated with surgery, partial chemotherapy and a full year of trastuzumab.   Osteopenia. At this time, she is only on a multivitamin, and so I have advised her to start oral calcium and vitamin D supplements. Last DEXA was in February of 2023.  Last time, she came in March of this year for an extra appointment for left breast pain and we evaluated it thoroughly, including a CT of the chest and all was negative. This has since resolved. She is doing well and so I will see her back in 1 year for re-examination. She understands and agrees with this plan of care.  I provided 15 minutes of face-to-face time during this this encounter and > 50% was spent counseling as documented under my assessment and plan.    Derwood Kaplan, MD [facility] [address] [place] [address] Dept: [phone] Dept Fax: [phone]    CHIEF COMPLAINT:  CC: History of stage IA HER2 receptor positive right breast cancer  Current Treatment:  Surveillance   HISTORY OF PRESENT ILLNESS:  [name] is a 71 y.o. female with a history of stage IA (T1c N0 M0) HER 2 Neu receptor positive right breast cancer diagnosed in November of 2014.  She had multifocal disease on MRI scan, so opted for bilateral mastectomies with reconstruction, which was done in January 2015.  Pathology revealed multifocal, grade 3, invasive ductal carcinoma of the right breast with the largest lesion measuring 1.7 cm with negative nodes.  Estrogen and progesterone receptors were negative and HER 2 Neu positive.  Ki  67 was 57%.  Adjuvant chemotherapy with docetaxel, carboplatin and trastuzumab was recommended.  She received 2 cycles, but had severe toxicities and was hospitalized after each cycle.  We switched her to weekly paclitaxel with trastuzumab, but she still had significant toxicities with severe skin rash.  We then substituted Abraxane for the paclitaxel, and she did not tolerate this regimen either, so chemotherapy was discontinued.  She did complete a full year of trastuzumab.  Her last echocardiogram in January of 2016 remained normal.    INTERVAL HISTORY:  I have reviewed her chart and materials related to her cancer extensively and collaborated history with the patient. Summary of oncologic history is as follows: Oncology History  Breast cancer, right breast ([facility])  12/27/2012 Cancer Staging   Staging form: Breast, AJCC 7th Edition - Clinical stage from 12/27/2012: Stage IA (T1c, N0, M0) - Signed by Derwood Kaplan, MD on 01/22/2020 Prognostic indicators: 4 foci, up to 1.7 cm, pos. LVI   12/27/2012 Pathology Results   Breast, right, needle core biopsy:  -  Invasive ductal carcinoma HER2: Amplification  Ratio of HER2: cep 17 signals: 5.11  Average HER2 copy #/cell: 6.9 ER: Negative 0% PR: Negative 0% Ki67: 57%    01/04/2013 Breast MRI   1. A 1.6 x 1.5 x 1.5 cm irregular enhancing mass in the upper inner  right breast, corresponding with the biopsy-proven invasive ductal  carcinoma.   2. Large area of non mass enhancement surrounding the biopsied  invasive ductal carcinoma in the  upper inner right breast. In  aggregate, the mass and surrounding non mass enhancement measure 5 x  5 x 2.6 cm.   3. Nonmass enhancement in the anterior upper slightly outer right  breast. The anterior margin of this area of enhancement and the  posterior margin of the non mass enhancement surrounding the  biopsied carcinoma spans 7.1 cm.   4. Two additional masses in the inner right breast, one at  the level  of the nipple measuring up to 6 mm and one in the inferior breast  measuring up to 6 mm.    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   Breast, right,needle core biopsy, inner central:  -  Invasive ductal carcinoma  -  Ductal carcinoma in situ HER2: Positive ER: Negative 0% PR: Negative 0% Ki67: 13%    02/01/2013 Initial Diagnosis   Breast cancer, right breast ([facility])   03/10/2013 Pathology Results   Lymph node, sentinel, biopsy, right axilla #1:  There is no evidence of carcinoma in 1 of 1 lymph node (0/1) Lymph node, sentinel, biopsy, right axilla #2:  There is no evidence of carcinoma in 1 of 1 lymph node (0/1) Breast, simple mastectomy, left:  Fibrocystic changes with calcifications   One benign lymph node (0/1)  There is no evidence of malignancy Breast, simple mastectomy, right:  Invasive ductal carcinoma, grade 3, multiple foci, the largest spans 1.7 cm  Ductal carcinoma in situ, high grade  Lymphovascular invasion is identified  The surgical resection margins are negative for carcinoma Skin, additional margins, left:  Benign skin  There is no evidence of malignancy Skin, additional margins, right:  Benign skin  There is no evidence of malignancy    04/2013 - 06/2013 Chemotherapy   Docetaxel, carboplatin and trastuzumab was recommended.  She received 2 cycles, but had severe toxicities and was hospitalized after each cycle.  We switched her to weekly paclitaxel with trastuzumab, but she still had significant toxicities with severe skin rash.  We then substituted Abraxane for the paclitaxel, and she did not tolerate this regimen either, so chemotherapy was discontinued.     04/2013 - 04/2014 Chemotherapy   She did complete a full year of trastuzumab     Alaze was added on to the schedule back in March due to new onset left breast pain. She did do some heavy lifting around that  time, and I suspect this was muscle sprain. I did a thorough evaluation including a CT of the chest, and all was negative.  X-ray imaging of the spine was negative other than degenerative changes. Bone density from February revealed osteopenia of the left femur with a T-score of -1.9, previously -0.8. Dual femur total mean measures -0.5. Left forearm measures -1.4.The pain has since resolved.  Her  appetite is good, and her weight is increased 2 pounds since her last visit.  She denies fever, chills or other signs of infection.  She denies nausea, vomiting, bowel issues, or abdominal pain.  She denies sore throat, cough, dyspnea, or chest pain.  HISTORY:   Allergies:  Allergies  Allergen Reactions   Ace Inhibitors Cough   Cephalexin Hives   Cephalosporins Hives    Possible allergy -- hives    Codeine Itching and Nausea Only   Tramadol Hives    Current Medications: Current Outpatient Medications  Medication Sig Dispense Refill   ALPRAZolam (XANAX) 0.25 MG tablet Take 1  tablet (0.25 mg total) by mouth 3 (three) times daily as needed for anxiety. 24 tablet 0   B Complex Vitamins (VITAMIN B COMPLEX) TABS Take 1 tablet by mouth daily.     blood glucose meter kit and supplies KIT Dispense based on patient and insurance preference. Use up to four times daily as directed. (FOR ICD-9 250.00, 250.01). 1 each 0   FLUoxetine (PROZAC) 20 MG capsule Take by mouth.     glucose blood test strip Use as instructed up to 3 times a day 100 each 12   loratadine (CLARITIN) 10 MG tablet TAKE 1 TABLET BY MOUTH EVERY DAY 90 tablet 1   losartan (COZAAR) 25 MG tablet Take 25 mg by mouth daily.     metoprolol tartrate (LOPRESSOR) 50 MG tablet TAKE 1/2 TABLET BY MOUTH TWICE A DAY 30 tablet 0   OZEMPIC, 0.25 OR 0.5 MG/DOSE, 2 MG/1.5ML SOPN Inject into the skin.     rosuvastatin (CRESTOR) 10 MG tablet TAKE 1 TABLET BY MOUTH 3 TIMES A WEEK FOR A COUPLE WEEKS THEN INCREASE TO DAILY IF TOLERATED (Patient taking  differently: Take 5 mg by mouth daily.) 90 tablet 1   triamterene-hydrochlorothiazide (MAXZIDE-25) 37.5-25 MG tablet      No current facility-administered medications for this visit.    REVIEW OF SYSTEMS:  Review of Systems  Constitutional: Negative.  Negative for appetite change, chills, fatigue, fever and unexpected weight change.  HENT:  Negative.    Eyes: Negative.   Respiratory: Negative.  Negative for chest tightness, cough, hemoptysis, shortness of breath and wheezing.   Cardiovascular: Negative.  Negative for chest pain, leg swelling and palpitations.  Gastrointestinal: Negative.  Negative for abdominal distention, abdominal pain, blood in stool, constipation, diarrhea, nausea and vomiting.  Endocrine: Negative.   Genitourinary: Negative.  Negative for difficulty urinating, dysuria, frequency and hematuria.   Musculoskeletal: Negative.  Negative for arthralgias, back pain, flank pain, gait problem and myalgias.  Skin: Negative.   Neurological: Negative.  Negative for dizziness, extremity weakness, gait problem, headaches, light-headedness, numbness, seizures and speech difficulty.  Hematological: Negative.   Psychiatric/Behavioral: Negative.  Negative for depression and sleep disturbance. The patient is not nervous/anxious.       VITALS:  Blood pressure (!) 165/74, pulse 76, temperature (!) 97.5 F (36.4 C), temperature source Oral, resp. rate 18, height _0  (1.626 m), weight 230 lb 11.2 oz (104.6 kg), SpO2 94 %.  Wt Readings from Last 3 Encounters:  01/29/22 230 lb 11.2 oz (104.6 kg)  05/09/21 228 lb 11.2 oz (103.7 kg)  01/21/21 228 lb 11.2 oz (103.7 kg)    Body mass index is 39.6 kg/m.  Performance status (ECOG): 1 - Symptomatic but completely ambulatory  PHYSICAL EXAM:  Physical Exam Constitutional:      General: She is not in acute distress.    Appearance: Normal appearance. She is normal weight.  HENT:     Head: Normocephalic and atraumatic.  Eyes:      General: No scleral icterus.    Extraocular Movements: Extraocular movements intact.     Conjunctiva/sclera: Conjunctivae normal.     Pupils: Pupils are equal, round, and reactive to light.  Cardiovascular:     Rate and Rhythm: Normal rate and regular rhythm.     Pulses: Normal pulses.     Heart sounds: Normal heart sounds. No murmur heard.    No friction rub. No gallop.  Pulmonary:     Effort: Pulmonary effort is normal. No respiratory distress.  Breath sounds: Normal breath sounds.  Chest:     Comments: Bilateral reconstructions are negative. Point tenderness along the left lateral lower ribcage. No other bone tenderness.  Abdominal:     General: Bowel sounds are normal. There is no distension.     Palpations: Abdomen is soft. There is no hepatomegaly, splenomegaly or mass.     Tenderness: There is no abdominal tenderness.  Musculoskeletal:        General: Normal range of motion.     Cervical back: Normal range of motion and neck supple.     Right lower leg: No edema.     Left lower leg: No edema.  Lymphadenopathy:     Cervical: No cervical adenopathy.  Skin:    General: Skin is warm and dry.  Neurological:     General: No focal deficit present.     Mental Status: She is alert and oriented to person, place, and time. Mental status is at baseline.  Psychiatric:        Mood and Affect: Mood normal.        Behavior: Behavior normal.        Thought Content: Thought content normal.        Judgment: Judgment normal.    LABS:      Latest Ref Rng & Units 05/15/2021   12:00 AM 09/01/2018   10:35 AM 05/04/2017   11:16 AM  CBC  WBC  4.9     5.1  5.7   Hemoglobin 12.0 - 16.0 14.8     14.4  14.4   Hematocrit 36 - 46 46     43.1  41.6   Platelets 150 - 400 K/uL 135     164.0  182.0      This result is from an external source.      Latest Ref Rng & Units 05/15/2021   12:00 AM 09/01/2018   10:35 AM 06/10/2017    1:05 PM  CMP  Glucose 70 - 99 mg/dL  140  109   BUN 4 - _0 Creatinine 0.5 - 1.1 0.8     0.81  0.85   Sodium 137 - 147 138     139  141   Potassium 3.5 - 5.1 mEq/L 3.8     4.7  3.7   Chloride 99 - 108 105     102  101   CO2 13 - _1 Calcium 8.7 - 10.7 9.0     9.5  9.6   Total Protein 6.0 - 8.3 g/dL  6.7    Total Bilirubin 0.2 - 1.2 mg/dL  1.0    Alkaline Phos 25 - 125 72     71    AST 13 - 35 40     25    ALT 7 - 35 U/L 27     25       This result is from an external source.     STUDIES:  No results found.    EXAM: 04/24/2021 DUAL X-RAY ABSORPTIOMETRY (DXA) FOR BONE MINERAL DENSITY   IMPRESSION:  Britani Seguin completed a BMD test on 04/24/2021 using the  [place] (analysis version: 13.60) manufactured by [employer]. The following summarizes the results of our evaluation.  Technologist: [facility]  PATIENT BIOGRAPHICAL:  Name: CONNOR, FOXWORTHY  Patient ID: [id number] [place] Birth Date: 05-May-1950 Height: 64.0 in.  Gender: Female Exam Date: 04/24/2021 Weight: 229.0 lbs.  Indications: Glucocorticoids (Chronic), History of Fracture (Adult)  Fractures: Treatments:   ASSESSMENT:  The BMD measured at Femur Neck Right is 0.772 g/cm2 with a T-score  of -1.9. This patient is considered osteopenic according to [facility]) criteria.  The scan quality is good.  Lumbar spine was not utilized due to degenerative changes.  Site Region Measured Measured WHO Young Adult BMD  Date Age Classification T-score   DualFemur Neck Right 04/24/2021 70.5 Osteopenia -1.9 0.772 g/cm2  DualFemur Neck Right 01/26/2013 62.2 Normal -0.8 0.929 g/cm2   DualFemur Total Mean 04/24/2021 70.5 Normal -0.5 0.943 g/cm2  DualFemur Total Mean 01/26/2013 62.2 Normal 0.5 1.071 g/cm2   Right Forearm Radius 33% 04/24/2021 70.5 Osteopenia -1.4 0.758 g/cm2    I, Rita Ohara, am acting as scribe for Derwood Kaplan, MD  I have reviewed this report as typed by the medical scribe, and it is complete and  accurate.

## 2022-03-31 DIAGNOSIS — E1159 Type 2 diabetes mellitus with other circulatory complications: Secondary | ICD-10-CM | POA: Diagnosis not present

## 2022-03-31 DIAGNOSIS — I152 Hypertension secondary to endocrine disorders: Secondary | ICD-10-CM | POA: Diagnosis not present

## 2022-04-24 DIAGNOSIS — L509 Urticaria, unspecified: Secondary | ICD-10-CM | POA: Diagnosis not present

## 2022-06-07 DIAGNOSIS — R509 Fever, unspecified: Secondary | ICD-10-CM | POA: Diagnosis not present

## 2022-06-07 DIAGNOSIS — H6690 Otitis media, unspecified, unspecified ear: Secondary | ICD-10-CM | POA: Diagnosis not present

## 2022-06-07 DIAGNOSIS — J0100 Acute maxillary sinusitis, unspecified: Secondary | ICD-10-CM | POA: Diagnosis not present

## 2022-07-30 DIAGNOSIS — B372 Candidiasis of skin and nail: Secondary | ICD-10-CM | POA: Diagnosis not present

## 2022-08-06 DIAGNOSIS — E119 Type 2 diabetes mellitus without complications: Secondary | ICD-10-CM | POA: Diagnosis not present

## 2022-08-06 DIAGNOSIS — L299 Pruritus, unspecified: Secondary | ICD-10-CM | POA: Diagnosis not present

## 2022-08-27 DIAGNOSIS — L570 Actinic keratosis: Secondary | ICD-10-CM | POA: Diagnosis not present

## 2022-08-27 DIAGNOSIS — L718 Other rosacea: Secondary | ICD-10-CM | POA: Diagnosis not present

## 2022-10-03 DIAGNOSIS — L300 Nummular dermatitis: Secondary | ICD-10-CM | POA: Diagnosis not present

## 2022-10-03 DIAGNOSIS — L299 Pruritus, unspecified: Secondary | ICD-10-CM | POA: Diagnosis not present

## 2022-11-24 DIAGNOSIS — Z9012 Acquired absence of left breast and nipple: Secondary | ICD-10-CM | POA: Diagnosis not present

## 2022-11-24 DIAGNOSIS — C50911 Malignant neoplasm of unspecified site of right female breast: Secondary | ICD-10-CM | POA: Diagnosis not present

## 2022-11-24 DIAGNOSIS — Z7984 Long term (current) use of oral hypoglycemic drugs: Secondary | ICD-10-CM | POA: Diagnosis not present

## 2022-11-24 DIAGNOSIS — H35373 Puckering of macula, bilateral: Secondary | ICD-10-CM | POA: Diagnosis not present

## 2022-11-24 DIAGNOSIS — H52223 Regular astigmatism, bilateral: Secondary | ICD-10-CM | POA: Diagnosis not present

## 2022-11-24 DIAGNOSIS — Z961 Presence of intraocular lens: Secondary | ICD-10-CM | POA: Diagnosis not present

## 2022-11-24 DIAGNOSIS — E119 Type 2 diabetes mellitus without complications: Secondary | ICD-10-CM | POA: Diagnosis not present

## 2022-11-24 DIAGNOSIS — H524 Presbyopia: Secondary | ICD-10-CM | POA: Diagnosis not present

## 2022-11-24 DIAGNOSIS — H5213 Myopia, bilateral: Secondary | ICD-10-CM | POA: Diagnosis not present

## 2022-11-24 DIAGNOSIS — H26493 Other secondary cataract, bilateral: Secondary | ICD-10-CM | POA: Diagnosis not present

## 2022-11-26 DIAGNOSIS — E1159 Type 2 diabetes mellitus with other circulatory complications: Secondary | ICD-10-CM | POA: Diagnosis not present

## 2022-11-26 DIAGNOSIS — E1169 Type 2 diabetes mellitus with other specified complication: Secondary | ICD-10-CM | POA: Diagnosis not present

## 2022-11-26 DIAGNOSIS — E785 Hyperlipidemia, unspecified: Secondary | ICD-10-CM | POA: Diagnosis not present

## 2022-11-26 DIAGNOSIS — I152 Hypertension secondary to endocrine disorders: Secondary | ICD-10-CM | POA: Diagnosis not present

## 2022-11-26 DIAGNOSIS — Z6841 Body Mass Index (BMI) 40.0 and over, adult: Secondary | ICD-10-CM | POA: Diagnosis not present

## 2022-11-26 DIAGNOSIS — G729 Myopathy, unspecified: Secondary | ICD-10-CM | POA: Diagnosis not present

## 2022-11-26 DIAGNOSIS — F418 Other specified anxiety disorders: Secondary | ICD-10-CM | POA: Diagnosis not present

## 2022-12-01 DIAGNOSIS — L300 Nummular dermatitis: Secondary | ICD-10-CM | POA: Diagnosis not present

## 2022-12-01 DIAGNOSIS — D485 Neoplasm of uncertain behavior of skin: Secondary | ICD-10-CM | POA: Diagnosis not present

## 2022-12-04 DIAGNOSIS — L03115 Cellulitis of right lower limb: Secondary | ICD-10-CM | POA: Diagnosis not present

## 2022-12-15 DIAGNOSIS — C44722 Squamous cell carcinoma of skin of right lower limb, including hip: Secondary | ICD-10-CM | POA: Diagnosis not present

## 2023-01-04 DIAGNOSIS — L97913 Non-pressure chronic ulcer of unspecified part of right lower leg with necrosis of muscle: Secondary | ICD-10-CM | POA: Diagnosis not present

## 2023-01-26 DIAGNOSIS — Z0000 Encounter for general adult medical examination without abnormal findings: Secondary | ICD-10-CM | POA: Diagnosis not present

## 2023-01-26 DIAGNOSIS — Z9181 History of falling: Secondary | ICD-10-CM | POA: Diagnosis not present

## 2023-02-04 ENCOUNTER — Ambulatory Visit: Payer: Medicare Other | Admitting: Oncology

## 2023-03-02 DIAGNOSIS — E1159 Type 2 diabetes mellitus with other circulatory complications: Secondary | ICD-10-CM | POA: Diagnosis not present

## 2023-03-02 DIAGNOSIS — Z23 Encounter for immunization: Secondary | ICD-10-CM | POA: Diagnosis not present

## 2023-03-02 DIAGNOSIS — I152 Hypertension secondary to endocrine disorders: Secondary | ICD-10-CM | POA: Diagnosis not present

## 2023-03-02 DIAGNOSIS — F418 Other specified anxiety disorders: Secondary | ICD-10-CM | POA: Diagnosis not present

## 2023-03-02 DIAGNOSIS — E1169 Type 2 diabetes mellitus with other specified complication: Secondary | ICD-10-CM | POA: Diagnosis not present

## 2023-03-02 DIAGNOSIS — T466X5A Adverse effect of antihyperlipidemic and antiarteriosclerotic drugs, initial encounter: Secondary | ICD-10-CM | POA: Diagnosis not present

## 2023-03-02 DIAGNOSIS — G720 Drug-induced myopathy: Secondary | ICD-10-CM | POA: Diagnosis not present

## 2023-03-02 DIAGNOSIS — E785 Hyperlipidemia, unspecified: Secondary | ICD-10-CM | POA: Diagnosis not present

## 2023-03-11 ENCOUNTER — Encounter: Payer: Self-pay | Admitting: Oncology

## 2023-03-11 ENCOUNTER — Other Ambulatory Visit: Payer: Self-pay | Admitting: Oncology

## 2023-03-11 ENCOUNTER — Inpatient Hospital Stay: Payer: HMO | Attending: Oncology | Admitting: Oncology

## 2023-03-11 VITALS — BP 127/62 | HR 76 | Temp 97.6°F | Resp 16 | Ht 64.0 in | Wt 235.5 lb

## 2023-03-11 DIAGNOSIS — M8580 Other specified disorders of bone density and structure, unspecified site: Secondary | ICD-10-CM

## 2023-03-11 DIAGNOSIS — Z9221 Personal history of antineoplastic chemotherapy: Secondary | ICD-10-CM | POA: Diagnosis not present

## 2023-03-11 DIAGNOSIS — Z85828 Personal history of other malignant neoplasm of skin: Secondary | ICD-10-CM | POA: Diagnosis not present

## 2023-03-11 DIAGNOSIS — Z171 Estrogen receptor negative status [ER-]: Secondary | ICD-10-CM

## 2023-03-11 DIAGNOSIS — Z853 Personal history of malignant neoplasm of breast: Secondary | ICD-10-CM | POA: Diagnosis not present

## 2023-03-11 DIAGNOSIS — Z9013 Acquired absence of bilateral breasts and nipples: Secondary | ICD-10-CM | POA: Diagnosis not present

## 2023-03-11 DIAGNOSIS — C50211 Malignant neoplasm of upper-inner quadrant of right female breast: Secondary | ICD-10-CM | POA: Diagnosis not present

## 2023-03-11 NOTE — Progress Notes (Signed)
 [facility]  [address] [place],  [place]  [address] [phone]  Clinic Day: 03/11/2023  Referring physician: Keren Vicenta BRAVO, MD  ASSESSMENT & PLAN:  Assessment: Stage IA HER 2 positive, hormone receptor negative, right breast cancer diagnosed in November 2014, 10 years ago, who has no evidence of disease.  She was treated with surgery, partial chemotherapy and a full year of trastuzumab.   Osteopenia. At this time, she is only on a multivitamin, and I had advised her to start oral calcium  and vitamin D supplements and she is compliant. Last DEXA was in February of 2023 so she will be due this year.  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  Her last bone density scan was done on 04/24/2021 that revealed osteopenia. I will schedule a repeat bone density scan for the end of February. I advised she take a oral calcium  and vitamin D supplement and she is compliant with that. Her PCP does routine labs and she had blood work done last week. I will see her in 1 year for reevaluation. She understands and agrees with this plan of care.  I provided 13 minutes of face-to-face time during this this encounter and > 50% was spent counseling as documented under my assessment and plan.   Heidi VEAR Cornish, MD [facility] [address] [place] [address] Dept: [phone] Dept Fax: [phone]   No orders of the defined types were placed in this encounter.  CHIEF COMPLAINT:  CC: History of stage IA HER2 receptor positive right breast cancer  Current Treatment:  Surveillance  HISTORY OF PRESENT ILLNESS:  [name] is a 73 y.o. female with a history of stage IA (T1c N0 M0) HER 2 Neu receptor positive right breast cancer diagnosed in November of 2014.  She had multifocal  disease on MRI scan, so opted for bilateral mastectomies with reconstruction, which was done in January 2015.  Pathology revealed multifocal, grade 3, invasive ductal carcinoma of the right breast with the largest lesion measuring 1.7 cm with negative nodes.  Estrogen and progesterone receptors were negative and HER 2 Neu positive.  Ki 67 was 57%.  Adjuvant chemotherapy with docetaxel, carboplatin and trastuzumab was recommended.  She received 2 cycles, but had severe toxicities and was hospitalized after each cycle.  We switched her to weekly paclitaxel with trastuzumab, but she still had significant toxicities with severe skin rash.  We then substituted Abraxane for the paclitaxel, and she did not tolerate this regimen either, so chemotherapy was discontinued.  She did complete a full year of trastuzumab.  Her last echocardiogram in January of 2016 remained normal.    I have reviewed her chart and materials related to her cancer extensively and collaborated history with the patient. Summary of oncologic history is as follows: Oncology History  Breast cancer, right breast (HCC)  12/27/2012 Cancer Staging   Staging form: Breast, AJCC 7th Edition - Clinical stage from 12/27/2012: Stage IA (T1c, N0, M0) - Signed by Jackson Heidi VEAR, MD on 01/22/2020 Prognostic indicators: 4 foci, up to 1.7 cm, pos. LVI   12/27/2012 Pathology Results   Breast, right, needle core biopsy:  -  Invasive ductal carcinoma HER2: Amplification  Ratio of HER2: cep 17 signals: 5.11  Average HER2 copy #/cell: 6.9 ER: Negative 0% PR: Negative 0% Ki67:  57%    01/04/2013 Breast MRI   1. A 1.6 x 1.5 x 1.5 cm irregular enhancing mass in the upper inner  right breast, corresponding with the biopsy-proven invasive ductal  carcinoma.   2. Large area of non mass enhancement surrounding the biopsied  invasive ductal carcinoma in the upper inner right breast. In  aggregate, the mass and surrounding non mass enhancement measure  5 x  5 x 2.6 cm.   3. Nonmass enhancement in the anterior upper slightly outer right  breast. The anterior margin of this area of enhancement and the  posterior margin of the non mass enhancement surrounding the  biopsied carcinoma spans 7.1 cm.   4. Two additional masses in the inner right breast, one at the level  of the nipple measuring up to 6 mm and one in the inferior breast  measuring up to 6 mm.    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   Breast, right,needle core biopsy, inner central:  -  Invasive ductal carcinoma  -  Ductal carcinoma in situ HER2: Positive ER: Negative 0% PR: Negative 0% Ki67: 13%    02/01/2013 Initial Diagnosis   Breast cancer, right breast (HCC)   03/10/2013 Pathology Results   Lymph node, sentinel, biopsy, right axilla #1:  There is no evidence of carcinoma in 1 of 1 lymph node (0/1) Lymph node, sentinel, biopsy, right axilla #2:  There is no evidence of carcinoma in 1 of 1 lymph node (0/1) Breast, simple mastectomy, left:  Fibrocystic changes with calcifications   One benign lymph node (0/1)  There is no evidence of malignancy Breast, simple mastectomy, right:  Invasive ductal carcinoma, grade 3, multiple foci, the largest spans 1.7 cm  Ductal carcinoma in situ, high grade  Lymphovascular invasion is identified  The surgical resection margins are negative for carcinoma Skin, additional margins, left:  Benign skin  There is no evidence of malignancy Skin, additional margins, right:  Benign skin  There is no evidence of malignancy    04/2013 - 06/2013 Chemotherapy   Docetaxel, carboplatin and trastuzumab was recommended.  She received 2 cycles, but had severe toxicities and was hospitalized after each cycle.  We switched her to weekly paclitaxel with trastuzumab, but she still had significant toxicities with severe skin rash.  We then substituted Abraxane for  the paclitaxel, and she did not tolerate this regimen either, so chemotherapy was discontinued.     04/2013 - 04/2014 Chemotherapy   She did complete a full year of trastuzumab    INTERVAL HISTORY:  Charnay is here for clinical assessment of her history of stage IA HER2 receptor positive right breast cancer. Patient states that she feels well and has no complaints of pain. 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  Her last bone density scan was done on 04/24/2021 that revealed osteopenia. I will schedule a repeat bone density scan for her at the end of February, and we will call her with the results. I advised she take a oral calcium  and vitamin D supplement and she is compliant with that. Her PCP does routine labs and she had blood work done last week. I will see her in 1 year for reevaluation. She denies signs of infection such as sore throat, sinus drainage, cough, or urinary symptoms.  She denies  fevers or recurrent chills. She denies pain. She denies nausea, vomiting, chest pain, dyspnea or cough. Her appetite is too good and her weight has increased 5 pounds over last year .  HISTORY:   Allergies:  Allergies  Allergen Reactions   Ace Inhibitors Cough   Cephalexin  Hives   Cephalosporins Hives    Possible allergy -- hives    Codeine Itching, Nausea Only and Other (See Comments)   Tramadol Hives    Current Medications: Current Outpatient Medications  Medication Sig Dispense Refill   ezetimibe (ZETIA) 10 MG tablet Take 10 mg by mouth daily.     losartan (COZAAR) 50 MG tablet Take 50 mg by mouth daily.     metFORMIN  (GLUCOPHAGE -XR) 500 MG 24 hr tablet Take 500 mg by mouth daily with breakfast.     Multiple Vitamin (MULTI-VITAMIN) tablet Take 1 tablet by mouth daily.     OZEMPIC, 1 MG/DOSE, 4 MG/3ML SOPN Inject 1 mg into the skin once a week.     pantoprazole (PROTONIX) 40 MG tablet Take 40  mg by mouth daily.     ALPRAZolam  (XANAX ) 0.25 MG tablet Take 1 tablet (0.25 mg total) by mouth 3 (three) times daily as needed for anxiety. 24 tablet 0   B Complex Vitamins (VITAMIN B COMPLEX) TABS Take 1 tablet by mouth daily.     blood glucose meter kit and supplies KIT Dispense based on patient and insurance preference. Use up to four times daily as directed. (FOR ICD-9 250.00, 250.01). 1 each 0   FLUoxetine  (PROZAC ) 20 MG capsule Take by mouth.     glucose blood test strip Use as instructed up to 3 times a day 100 each 12   loratadine  (CLARITIN ) 10 MG tablet TAKE 1 TABLET BY MOUTH EVERY DAY 90 tablet 1   metoprolol  tartrate (LOPRESSOR ) 50 MG tablet TAKE 1/2 TABLET BY MOUTH TWICE A DAY 30 tablet 0   triamterene -hydrochlorothiazide  (MAXZIDE -25) 37.5-25 MG tablet      No current facility-administered medications for this visit.    REVIEW OF SYSTEMS:  Review of Systems  Constitutional: Negative.  Negative for appetite change, chills, diaphoresis, fatigue, fever and unexpected weight change.  HENT:  Negative.  Negative for hearing loss, lump/mass, mouth sores, nosebleeds, sore throat, tinnitus, trouble swallowing and voice change.   Eyes:  Negative for eye problems and icterus.  Respiratory: Negative.  Negative for chest tightness, cough, hemoptysis, shortness of breath and wheezing.   Cardiovascular: Negative.  Negative for chest pain, leg swelling and palpitations.  Gastrointestinal: Negative.  Negative for abdominal distention, abdominal pain, blood in stool, constipation, diarrhea, nausea, rectal pain and vomiting.  Endocrine: Negative.   Genitourinary: Negative.  Negative for bladder incontinence, difficulty urinating, dyspareunia, dysuria, frequency, hematuria, menstrual problem, nocturia, pelvic pain, vaginal bleeding and vaginal discharge.   Musculoskeletal: Negative.  Negative for arthralgias, back pain, flank pain, gait problem, myalgias, neck pain and neck stiffness.  Skin:  Negative.  Negative for itching, rash and wound.  Neurological: Negative.  Negative for dizziness, extremity weakness, gait problem, headaches, light-headedness, numbness, seizures and speech difficulty.  Hematological: Negative.  Negative for adenopathy. Does not bruise/bleed easily.  Psychiatric/Behavioral: Negative.  Negative for confusion, decreased concentration, depression, sleep disturbance and suicidal ideas. The patient is not nervous/anxious.       VITALS:  Blood pressure 127/62, pulse 76, temperature 97.6 F (36.4 C), temperature source Oral, resp. rate 16, height 5' 4 (1.626 m), weight 235 lb 8 oz (106.8 kg), SpO2 95%.  Wt  Readings from Last 3 Encounters:  03/11/23 235 lb 8 oz (106.8 kg)  01/29/22 230 lb 11.2 oz (104.6 kg)  05/09/21 228 lb 11.2 oz (103.7 kg)    Body mass index is 40.42 kg/m.  Performance status (ECOG): 1 - Symptomatic but completely ambulatory  PHYSICAL EXAM:  Physical Exam Vitals and nursing note reviewed.  Constitutional:      General: She is not in acute distress.    Appearance: Normal appearance. She is normal weight. She is not ill-appearing, toxic-appearing or diaphoretic.  HENT:     Head: Normocephalic and atraumatic.     Right Ear: Tympanic membrane, ear canal and external ear normal. There is no impacted cerumen.     Left Ear: Tympanic membrane, ear canal and external ear normal. There is no impacted cerumen.     Nose: Nose normal. No congestion or rhinorrhea.     Mouth/Throat:     Mouth: Mucous membranes are moist.     Pharynx: Oropharynx is clear. No oropharyngeal exudate or posterior oropharyngeal erythema.  Eyes:     General: No scleral icterus.       Right eye: No discharge.        Left eye: No discharge.     Extraocular Movements: Extraocular movements intact.     Conjunctiva/sclera: Conjunctivae normal.     Pupils: Pupils are equal, round, and reactive to light.  Neck:     Vascular: No carotid bruit.  Cardiovascular:     Rate  and Rhythm: Normal rate and regular rhythm.     Pulses: Normal pulses.     Heart sounds: Normal heart sounds. No murmur heard.    No friction rub. No gallop.  Pulmonary:     Effort: Pulmonary effort is normal. No respiratory distress.     Breath sounds: Normal breath sounds. No stridor. No wheezing, rhonchi or rales.  Chest:     Chest wall: No tenderness.     Comments: Bilateral reconstructions are negative.  Abdominal:     General: Bowel sounds are normal. There is no distension.     Palpations: Abdomen is soft. There is no hepatomegaly, splenomegaly or mass.     Tenderness: There is no abdominal tenderness. There is no right CVA tenderness, left CVA tenderness, guarding or rebound.     Hernia: No hernia is present.  Musculoskeletal:        General: No swelling, tenderness, deformity or signs of injury. Normal range of motion.     Cervical back: Normal range of motion and neck supple. No rigidity or tenderness.     Right lower leg: No edema.     Left lower leg: No edema.  Lymphadenopathy:     Cervical: No cervical adenopathy.     Right cervical: No superficial, deep or posterior cervical adenopathy.    Left cervical: No superficial, deep or posterior cervical adenopathy.     Upper Body:     Right upper body: No supraclavicular, axillary or pectoral adenopathy.     Left upper body: No supraclavicular, axillary or pectoral adenopathy.  Skin:    General: Skin is warm and dry.     Coloration: Skin is not jaundiced or pale.     Findings: No bruising, erythema, lesion or rash.  Neurological:     General: No focal deficit present.     Mental Status: She is alert and oriented to person, place, and time. Mental status is at baseline.     Cranial Nerves: No cranial nerve deficit.  Sensory: No sensory deficit.     Motor: No weakness.     Coordination: Coordination normal.     Gait: Gait normal.     Deep Tendon Reflexes: Reflexes normal.  Psychiatric:        Mood and Affect: Mood  normal.        Behavior: Behavior normal.        Thought Content: Thought content normal.        Judgment: Judgment normal.     LABS:      Latest Ref Rng & Units 05/15/2021   12:00 AM 09/01/2018   10:35 AM 05/04/2017   11:16 AM  CBC  WBC  4.9     5.1  5.7   Hemoglobin 12.0 - 16.0 14.8     14.4  14.4   Hematocrit 36 - 46 46     43.1  41.6   Platelets 150 - 400 K/uL 135     164.0  182.0      This result is from an external source.      Latest Ref Rng & Units 05/15/2021   12:00 AM 09/01/2018   10:35 AM 06/10/2017    1:05 PM  CMP  Glucose 70 - 99 mg/dL  859  890   BUN 4 - 21 20     19  22    Creatinine 0.5 - 1.1 0.8     0.81  0.85   Sodium 137 - 147 138     139  141   Potassium 3.5 - 5.1 mEq/L 3.8     4.7  3.7   Chloride 99 - 108 105     102  101   CO2 13 - 22 28     31  31    Calcium  8.7 - 10.7 9.0     9.5  9.6   Total Protein 6.0 - 8.3 g/dL  6.7    Total Bilirubin 0.2 - 1.2 mg/dL  1.0    Alkaline Phos 25 - 125 72     71    AST 13 - 35 40     25    ALT 7 - 35 U/L 27     25       This result is from an external source.     STUDIES:  No results found.      I,Jasmine M Lassiter,acting as a scribe for Heidi VEAR Cornish, MD.,have documented all relevant documentation on the behalf of HIDAYA DANIEL, MD,as directed by  Heidi VEAR Cornish, MD while in the presence of Heidi VEAR Cornish, MD.  I have reviewed this report as typed by the medical scribe, and it is complete and accurate.

## 2023-05-04 ENCOUNTER — Ambulatory Visit (INDEPENDENT_AMBULATORY_CARE_PROVIDER_SITE_OTHER)
Admission: RE | Admit: 2023-05-04 | Discharge: 2023-05-04 | Disposition: A | Discharge status: Home / Self Care | Payer: HMO | Source: Ambulatory Visit | Attending: Oncology | Admitting: Oncology

## 2023-05-04 DIAGNOSIS — Z780 Asymptomatic menopausal state: Secondary | ICD-10-CM

## 2023-05-04 DIAGNOSIS — M85852 Other specified disorders of bone density and structure, left thigh: Secondary | ICD-10-CM | POA: Diagnosis not present

## 2023-05-04 DIAGNOSIS — M8580 Other specified disorders of bone density and structure, unspecified site: Secondary | ICD-10-CM

## 2023-05-13 ENCOUNTER — Telehealth: Payer: Self-pay

## 2023-05-13 NOTE — Telephone Encounter (Signed)
 Patient notified

## 2023-05-13 NOTE — Telephone Encounter (Signed)
-----   Message from Dellia Beckwith sent at 05/13/2023  1:10 PM EDT ----- Regarding: call Tell  her DEXA looks good, spine is normal and hip shows just barely into osteopenia, borderline thinning

## 2023-06-01 DIAGNOSIS — G720 Drug-induced myopathy: Secondary | ICD-10-CM | POA: Diagnosis not present

## 2023-06-01 DIAGNOSIS — T466X5A Adverse effect of antihyperlipidemic and antiarteriosclerotic drugs, initial encounter: Secondary | ICD-10-CM | POA: Diagnosis not present

## 2023-06-01 DIAGNOSIS — E1159 Type 2 diabetes mellitus with other circulatory complications: Secondary | ICD-10-CM | POA: Diagnosis not present

## 2023-06-01 DIAGNOSIS — E785 Hyperlipidemia, unspecified: Secondary | ICD-10-CM | POA: Diagnosis not present

## 2023-06-01 DIAGNOSIS — E1169 Type 2 diabetes mellitus with other specified complication: Secondary | ICD-10-CM | POA: Diagnosis not present

## 2023-06-01 DIAGNOSIS — F418 Other specified anxiety disorders: Secondary | ICD-10-CM | POA: Diagnosis not present

## 2023-06-01 DIAGNOSIS — I152 Hypertension secondary to endocrine disorders: Secondary | ICD-10-CM | POA: Diagnosis not present

## 2023-06-22 DIAGNOSIS — M7918 Myalgia, other site: Secondary | ICD-10-CM | POA: Diagnosis not present

## 2023-07-29 DIAGNOSIS — Z961 Presence of intraocular lens: Secondary | ICD-10-CM | POA: Diagnosis not present

## 2023-07-29 DIAGNOSIS — H5231 Anisometropia: Secondary | ICD-10-CM | POA: Diagnosis not present

## 2023-07-29 DIAGNOSIS — E119 Type 2 diabetes mellitus without complications: Secondary | ICD-10-CM | POA: Diagnosis not present

## 2023-07-29 DIAGNOSIS — H524 Presbyopia: Secondary | ICD-10-CM | POA: Diagnosis not present

## 2023-07-29 DIAGNOSIS — H35373 Puckering of macula, bilateral: Secondary | ICD-10-CM | POA: Diagnosis not present

## 2023-07-29 DIAGNOSIS — Z9849 Cataract extraction status, unspecified eye: Secondary | ICD-10-CM | POA: Diagnosis not present

## 2023-08-04 DIAGNOSIS — L209 Atopic dermatitis, unspecified: Secondary | ICD-10-CM | POA: Diagnosis not present

## 2023-08-10 DIAGNOSIS — C50911 Malignant neoplasm of unspecified site of right female breast: Secondary | ICD-10-CM | POA: Diagnosis not present

## 2023-08-10 DIAGNOSIS — Z9012 Acquired absence of left breast and nipple: Secondary | ICD-10-CM | POA: Diagnosis not present

## 2023-08-19 DIAGNOSIS — M7918 Myalgia, other site: Secondary | ICD-10-CM | POA: Diagnosis not present

## 2023-08-19 DIAGNOSIS — S76311A Strain of muscle, fascia and tendon of the posterior muscle group at thigh level, right thigh, initial encounter: Secondary | ICD-10-CM | POA: Diagnosis not present

## 2023-08-31 DIAGNOSIS — F418 Other specified anxiety disorders: Secondary | ICD-10-CM | POA: Diagnosis not present

## 2023-08-31 DIAGNOSIS — E785 Hyperlipidemia, unspecified: Secondary | ICD-10-CM | POA: Diagnosis not present

## 2023-08-31 DIAGNOSIS — I152 Hypertension secondary to endocrine disorders: Secondary | ICD-10-CM | POA: Diagnosis not present

## 2023-08-31 DIAGNOSIS — F4001 Agoraphobia with panic disorder: Secondary | ICD-10-CM | POA: Diagnosis not present

## 2023-08-31 DIAGNOSIS — T466X5A Adverse effect of antihyperlipidemic and antiarteriosclerotic drugs, initial encounter: Secondary | ICD-10-CM | POA: Diagnosis not present

## 2023-08-31 DIAGNOSIS — G720 Drug-induced myopathy: Secondary | ICD-10-CM | POA: Diagnosis not present

## 2023-08-31 DIAGNOSIS — E1159 Type 2 diabetes mellitus with other circulatory complications: Secondary | ICD-10-CM | POA: Diagnosis not present

## 2023-08-31 DIAGNOSIS — E1169 Type 2 diabetes mellitus with other specified complication: Secondary | ICD-10-CM | POA: Diagnosis not present

## 2023-09-01 DIAGNOSIS — Z9012 Acquired absence of left breast and nipple: Secondary | ICD-10-CM | POA: Diagnosis not present

## 2023-09-01 DIAGNOSIS — C50911 Malignant neoplasm of unspecified site of right female breast: Secondary | ICD-10-CM | POA: Diagnosis not present

## 2023-09-28 DIAGNOSIS — R2689 Other abnormalities of gait and mobility: Secondary | ICD-10-CM | POA: Diagnosis not present

## 2023-09-28 DIAGNOSIS — M25561 Pain in right knee: Secondary | ICD-10-CM | POA: Diagnosis not present

## 2023-09-28 DIAGNOSIS — M25461 Effusion, right knee: Secondary | ICD-10-CM | POA: Diagnosis not present

## 2023-10-05 DIAGNOSIS — M25561 Pain in right knee: Secondary | ICD-10-CM | POA: Diagnosis not present

## 2023-10-05 DIAGNOSIS — R2689 Other abnormalities of gait and mobility: Secondary | ICD-10-CM | POA: Diagnosis not present

## 2023-10-05 DIAGNOSIS — M25461 Effusion, right knee: Secondary | ICD-10-CM | POA: Diagnosis not present

## 2023-10-12 DIAGNOSIS — M25561 Pain in right knee: Secondary | ICD-10-CM | POA: Diagnosis not present

## 2023-10-12 DIAGNOSIS — R2689 Other abnormalities of gait and mobility: Secondary | ICD-10-CM | POA: Diagnosis not present

## 2023-10-12 DIAGNOSIS — M25461 Effusion, right knee: Secondary | ICD-10-CM | POA: Diagnosis not present

## 2023-10-26 DIAGNOSIS — M25561 Pain in right knee: Secondary | ICD-10-CM | POA: Diagnosis not present

## 2023-10-26 DIAGNOSIS — M25461 Effusion, right knee: Secondary | ICD-10-CM | POA: Diagnosis not present

## 2023-10-26 DIAGNOSIS — R2689 Other abnormalities of gait and mobility: Secondary | ICD-10-CM | POA: Diagnosis not present

## 2023-10-28 DIAGNOSIS — I83893 Varicose veins of bilateral lower extremities with other complications: Secondary | ICD-10-CM | POA: Diagnosis not present

## 2023-12-04 DIAGNOSIS — M79674 Pain in right toe(s): Secondary | ICD-10-CM | POA: Diagnosis not present

## 2023-12-04 DIAGNOSIS — L603 Nail dystrophy: Secondary | ICD-10-CM | POA: Diagnosis not present

## 2023-12-07 DIAGNOSIS — E785 Hyperlipidemia, unspecified: Secondary | ICD-10-CM | POA: Diagnosis not present

## 2023-12-07 DIAGNOSIS — I152 Hypertension secondary to endocrine disorders: Secondary | ICD-10-CM | POA: Diagnosis not present

## 2023-12-07 DIAGNOSIS — E1159 Type 2 diabetes mellitus with other circulatory complications: Secondary | ICD-10-CM | POA: Diagnosis not present

## 2023-12-07 DIAGNOSIS — G720 Drug-induced myopathy: Secondary | ICD-10-CM | POA: Diagnosis not present

## 2023-12-07 DIAGNOSIS — Z23 Encounter for immunization: Secondary | ICD-10-CM | POA: Diagnosis not present

## 2023-12-07 DIAGNOSIS — E1169 Type 2 diabetes mellitus with other specified complication: Secondary | ICD-10-CM | POA: Diagnosis not present

## 2023-12-07 DIAGNOSIS — F418 Other specified anxiety disorders: Secondary | ICD-10-CM | POA: Diagnosis not present

## 2023-12-07 DIAGNOSIS — T466X5A Adverse effect of antihyperlipidemic and antiarteriosclerotic drugs, initial encounter: Secondary | ICD-10-CM | POA: Diagnosis not present

## 2023-12-30 DIAGNOSIS — C44722 Squamous cell carcinoma of skin of right lower limb, including hip: Secondary | ICD-10-CM | POA: Diagnosis not present

## 2023-12-30 DIAGNOSIS — L821 Other seborrheic keratosis: Secondary | ICD-10-CM | POA: Diagnosis not present

## 2023-12-30 DIAGNOSIS — D485 Neoplasm of uncertain behavior of skin: Secondary | ICD-10-CM | POA: Diagnosis not present

## 2023-12-30 DIAGNOSIS — B351 Tinea unguium: Secondary | ICD-10-CM | POA: Diagnosis not present

## 2023-12-30 DIAGNOSIS — L603 Nail dystrophy: Secondary | ICD-10-CM | POA: Diagnosis not present

## 2024-01-08 ENCOUNTER — Other Ambulatory Visit: Payer: Self-pay

## 2024-01-08 DIAGNOSIS — I8393 Asymptomatic varicose veins of bilateral lower extremities: Secondary | ICD-10-CM

## 2024-01-11 DIAGNOSIS — M75101 Unspecified rotator cuff tear or rupture of right shoulder, not specified as traumatic: Secondary | ICD-10-CM | POA: Diagnosis not present

## 2024-01-14 DIAGNOSIS — M75101 Unspecified rotator cuff tear or rupture of right shoulder, not specified as traumatic: Secondary | ICD-10-CM | POA: Diagnosis not present

## 2024-01-15 DIAGNOSIS — M75101 Unspecified rotator cuff tear or rupture of right shoulder, not specified as traumatic: Secondary | ICD-10-CM | POA: Diagnosis not present

## 2024-01-19 DIAGNOSIS — M75101 Unspecified rotator cuff tear or rupture of right shoulder, not specified as traumatic: Secondary | ICD-10-CM | POA: Diagnosis not present

## 2024-02-16 ENCOUNTER — Encounter: Payer: Self-pay | Admitting: Physician Assistant

## 2024-02-16 ENCOUNTER — Ambulatory Visit

## 2024-02-16 ENCOUNTER — Ambulatory Visit (HOSPITAL_COMMUNITY): Admission: RE | Admit: 2024-02-16 | Discharge: 2024-02-16 | Attending: Vascular Surgery

## 2024-02-16 DIAGNOSIS — I83899 Varicose veins of unspecified lower extremities with other complications: Secondary | ICD-10-CM | POA: Diagnosis present

## 2024-02-16 DIAGNOSIS — I8393 Asymptomatic varicose veins of bilateral lower extremities: Secondary | ICD-10-CM | POA: Diagnosis present

## 2024-02-16 NOTE — Progress Notes (Signed)
 [facility]           OF [place]  History and Physical   [name] is a 73 y.o. female who presents with bleeding varicosities left ankle.    She states that she has superficial varicosities around the left ankle.  These have bleed 3 times over the past several months.  She states the last bleed was around the end of September.  She is not really able to elevate her leg and apply pressure.  Thankfully, her daughter was present and able to hold pressure.  She does not leave home without her kit with bleed stop and coban.  She states that when they do bleed, her home looks like a crime seen.  She does not have significant swelling.  She lives in [place] and got compression stockings at [facility].   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      She has hx of right breast cancer in 2014.    The pt is not on a statin for cholesterol management.  The pt is not on a daily aspirin.   Other AC:  none The pt is on ARB, diuretic for hypertension.   The pt is  on medication for diabetes.   Tobacco hx:  former  Pt does not have family hx of AAA.  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   Anxiety    Arthritis    ARTHROPATHY NOS, HAND 11/30/2006   Cancer (HCC)    right breast cancer   Cough due to ACE inhibitor 04/28/2011   Depression    Ganglion cyst    GERD (gastroesophageal reflux disease)    occ   HYPERTENSION 11/22/2007   echo card 2004 nl lv function    NECK PAIN 11/22/2007   Other specified disorder of skin 12/25/2008   PONV (postoperative nausea and vomiting)    TOBACCO USE, QUIT 12/25/2008   Urticaria     Past Surgical History:  Procedure Laterality Date   ABDOMINAL HYSTERECTOMY  1989   APPENDECTOMY     BREAST RECONSTRUCTION Left 12/01/2013   dr leora   BREAST RECONSTRUCTION Left 12/01/2013   Procedure:  BREAST  RECONSTRUCTION;  Surgeon: Alm Leora, MD;  Location: [facility] OR;  Service: Plastics;  Laterality: Left;   BREAST RECONSTRUCTION WITH PLACEMENT OF TISSUE EXPANDER AND FLEX HD (ACELLULAR HYDRATED DERMIS) Bilateral 03/10/2013   Procedure: PLACEMENT OF BILATERAL TISSUE EXPANDER WITH POSSIBLE FLEX HD (ACELLULAR HYDRATED DERMIS) FOR BREAST RECONSTRUCTION;  Surgeon: Alm Leora, MD;  Location: [facility] OR;  Service: Plastics;  Laterality: Bilateral;   CHOLECYSTECTOMY  2/15   COLONOSCOPY     LAPAROSCOPIC APPENDECTOMY     MASTECTOMY COMPLETE / SIMPLE W/ SENTINEL NODE BIOPSY Bilateral    MASTECTOMY W/ SENTINEL NODE BIOPSY Bilateral 03/10/2013   Procedure: BILATERAL MASTECTOMY WITH RIGHT SENTINEL LYMPH NODE BIOPSY FOLLOWED BY RECONSTRUCTION;  Surgeon: Deward GORMAN Curvin DOUGLAS, MD;  Location: [facility] OR;  Service: General;  Laterality: Bilateral;   ORIF ANKLE FRACTURE Left 03/19/2017   [place]  [place]   PORT-A-CATH REMOVAL Left 12/01/2014   Procedure: REMOVAL PORT-A-CATH;  Surgeon: Deward Curvin III, MD;  Location: [facility];  Service: General;  Laterality: Left;   PORTACATH PLACEMENT Left 03/10/2013   Procedure: INSERTION PORT-A-CATH;  Surgeon: Deward GORMAN Curvin DOUGLAS, MD;  Location: [facility] OR;  Service:  General;  Laterality: Left;   REMOVAL OF TISSUE EXPANDER AND PLACEMENT OF IMPLANT Left 05/05/2013   Procedure: REMOVAL OF LEFT BREAST TISSUE EXPANDER / incision and drainage of ceroma;  Surgeon: Alm Sick, MD;  Location: [facility] OR;  Service: Plastics;  Laterality: Left;   SHOULDER SURGERY     right shoulder scope, open RCR, biceps tendonesis 12/17/15   TISSUE EXPANDER PLACEMENT Left 12/01/2013   Procedure: TISSUE EXPANDER FOR DELAYED LEFT BREAST RECONSTRUCTION;  Surgeon: Alm Sick, MD;  Location: [facility] OR;  Service: Plastics;  Laterality: Left;   WRIST SURGERY  2001    Social History   Socioeconomic History   Marital status: Divorced    Spouse name: Not on file   Number of children: Not on file   Years of education: Not on file    Highest education level: Not on file  Occupational History   Occupation: Hairdresser  Tobacco Use   Smoking status: Former    Current packs/day: 0.00    Average packs/day: 1.5 packs/day for 15.0 years (22.5 ttl pk-yrs)    Types: Cigarettes    Start date: 03/03/1972    Quit date: 03/04/1987    Years since quitting: 36.9   Smokeless tobacco: Never  Substance and Sexual Activity   Alcohol use: No   Drug use: No   Sexual activity: Not on file  Other Topics Concern   Not on file  Social History Narrative   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    Former tobacco  No tad     Divorced.       Brother temp , son back in for temporarily.  People      Dog    Social Drivers of Health   Tobacco Use: Medium Risk (03/11/2023)   Patient History    Smoking Tobacco Use: Former    Smokeless Tobacco Use: Never    Passive Exposure: Not on [place] Strain: Not on file  Food Insecurity: Not on file  Transportation Needs: Not on file  Physical Activity: Not on file  Stress: Not on file  Social Connections: Not on file  Intimate Partner Violence: Not on file  Depression (PHQ2-9): Not on file  Alcohol Screen: Not on file  Housing: Not on file  Utilities: Not on file  Health Literacy: Not on file     Family History  Problem Relation Age of Onset   Alzheimer's disease Mother    Hypertension Mother    Anxiety disorder Mother    Allergic rhinitis Mother    Diabetes Father    Alcoholism Father    Cancer Brother        Cancer at base of tongue   Diabetes Other        1st degree relative   Bladder Cancer Brother     Current Outpatient Medications  Medication Sig Dispense Refill   ALPRAZolam  (XANAX ) 0.25 MG tablet Take 1 tablet (0.25 mg total) by mouth 3 (three) times daily as needed for anxiety. 24 tablet 0   B Complex Vitamins (VITAMIN B COMPLEX) TABS Take 1 tablet by mouth daily.     blood glucose meter kit  and supplies KIT Dispense based on patient and insurance preference. Use up to four times daily as directed. (FOR ICD-9 250.00, 250.01). 1 each 0   ezetimibe (ZETIA) 10 MG tablet Take 10 mg by mouth daily.  FLUoxetine  (PROZAC ) 20 MG capsule Take by mouth.     glucose blood test strip Use as instructed up to 3 times a day 100 each 12   loratadine  (CLARITIN ) 10 MG tablet TAKE 1 TABLET BY MOUTH EVERY DAY 90 tablet 1   losartan (COZAAR) 50 MG tablet Take 50 mg by mouth daily.     metFORMIN  (GLUCOPHAGE -XR) 500 MG 24 hr tablet Take 500 mg by mouth daily with breakfast.     metoprolol  tartrate (LOPRESSOR ) 50 MG tablet TAKE 1/2 TABLET BY MOUTH TWICE A DAY 30 tablet 0   Multiple Vitamin (MULTI-VITAMIN) tablet Take 1 tablet by mouth daily.     OZEMPIC, 1 MG/DOSE, 4 MG/3ML SOPN Inject 1 mg into the skin once a week.     pantoprazole (PROTONIX) 40 MG tablet Take 40 mg by mouth daily.     triamterene -hydrochlorothiazide  (MAXZIDE -25) 37.5-25 MG tablet      No current facility-administered medications for this visit.    Allergies[1]  REVIEW OF SYSTEMS:   [X]  denotes positive finding, [ ]  denotes negative finding Cardiac  Comments:  Chest pain or chest pressure:    Shortness of breath upon exertion:    Short of breath when lying flat:    Irregular heart rhythm:        Vascular    Pain in calf, thigh, or hip brought on by ambulation:    Pain in feet at night that wakes you up from your sleep:     Blood clot in your veins:    Bleeding varicose veins        Pulmonary    Oxygen at home:    Productive cough:     Wheezing:         Neurologic    Sudden weakness in arms or legs:     Sudden numbness in arms or legs:     Sudden onset of difficulty speaking or slurred speech:    Temporary loss of vision in one eye:     Problems with dizziness:         Gastrointestinal    Blood in stool:     Vomited blood:         Genitourinary    Burning when urinating:     Blood in urine:         Psychiatric    Major depression:         Hematologic    Bleeding problems:    Problems with blood clotting too easily:        Skin    Rashes or ulcers:        Constitutional    Fever or chills:      PHYSICAL EXAMINATION:    General:  WDWN in NAD; vital signs documented above Gait: Not observed HENT: WNL, normocephalic Pulmonary: normal non-labored breathing without wheezing Cardiac: regular HR; without carotid bruits Abdomen: soft, NT, aortic pulse is not palpable Skin: without rashes Vascular Exam/Pulses:  Right Left  Radial 2+ (normal) 2+ (normal)  DP 2+ (normal) 2+ (normal)   Extremities:    Neurologic: A&O X 3;  moving all extremities equally Psychiatric:  The pt has Normal affect.   Non-Invasive Vascular Imaging:   Venous duplex on 02/16/2024: +------------------+---------+------+-----------+------------+--------+  RIGHT            Reflux NoRefluxReflux TimeDiameter cmsComments  Yes                                   +------------------+---------+------+-----------+------------+--------+  CFV              no                                              +------------------+---------+------+-----------+------------+--------+  FV mid            no                                              +------------------+---------+------+-----------+------------+--------+  Popliteal        no                                              +------------------+---------+------+-----------+------------+--------+  GSV at SFJ                  yes    >500 ms      1.09              +------------------+---------+------+-----------+------------+--------+  GSV prox thigh              yes    >500 ms      0.85              +------------------+---------+------+-----------+------------+--------+  GSV mid thigh               yes    >500 ms      0.33               +------------------+---------+------+-----------+------------+--------+  GSV dist thigh              yes    >500 ms      0.40              +------------------+---------+------+-----------+------------+--------+  GSV at knee                 yes    >500 ms      0.35              +------------------+---------+------+-----------+------------+--------+  GSV prox calf               yes    >500 ms      0.31              +------------------+---------+------+-----------+------------+--------+  SSV post thigh extno                            0.33              +------------------+---------+------+-----------+------------+--------+  SSV knee          no                            0.27              +------------------+---------+------+-----------+------------+--------+   Summary:    Right:  - No evidence of deep vein thrombosis  seen in the right lower extremity,  from the common femoral through the popliteal veins.  - No evidence of superficial venous thrombosis in the right lower extremity.  - The deep venous system is competent.  - The great saphenous vein is grossly incompetent.  - The small saphenous vein is competent.  - There is a large cluster of varicose veins that arises from the GSV at the prox-mid calf, and drains into the GSV at the prox-mid thigh     KAYLINN DEDIC is a 73 y.o. female who presents with: bleeding varicose veins on the right medial ankle.      -pt has palpable DP pedal pulses bliaterally -in the right lower extremity, the pt does not have evidence of DVT.  Pt does have venous reflux in the GSV at the [facility] throughout the thigh.   -she has had 3 episodes of bleeding from the right medial ankle varicose veins.   -discussed with pt about wearing knee high 15-20 mmHg compression stockings and pt will be measured for these at [facility].    Vein RN evaluated pt and felt she would be a good candidate for sclerotherapy for her  bleeding veins at the right medial ankle.  -discussed the importance of leg elevation and how to elevate properly - pt is advised to elevate their legs and a diagram is given to them to demonstrate for pt to lay flat on their back with knees elevated and slightly bent with their feet higher than their knees, which puts their feet higher than their heart for 15 minutes per day.  If pt cannot lay flat, advised to lay as flat as possible.  -pt is advised to continue as much walking as possible and avoid sitting or standing for long periods of time.  -discussed importance of weight loss and exercise and that water  aerobics would also be beneficial.  -handout with recommendations given -pt will f/u for sclerotherapy in the near future.    Lucie Apt, [facility] [phone]  Clinic MD:  Gretta     [1]  Allergies Allergen Reactions   Ace Inhibitors Cough   Cephalexin  Hives   Cephalosporins Hives    Possible allergy -- hives    Codeine Itching, Nausea Only and Other (See Comments)   Tramadol Hives

## 2024-03-16 ENCOUNTER — Ambulatory Visit: Payer: HMO | Admitting: Oncology

## 2024-03-24 ENCOUNTER — Inpatient Hospital Stay: Payer: Self-pay | Attending: Oncology | Admitting: Oncology

## 2024-03-24 ENCOUNTER — Encounter: Payer: Self-pay | Admitting: Oncology

## 2024-03-24 ENCOUNTER — Telehealth: Payer: Self-pay | Admitting: Oncology

## 2024-03-24 VITALS — BP 115/53 | HR 72 | Temp 97.7°F | Resp 16 | Ht 64.0 in | Wt 222.6 lb

## 2024-03-24 DIAGNOSIS — Z171 Estrogen receptor negative status [ER-]: Secondary | ICD-10-CM | POA: Diagnosis not present

## 2024-03-24 DIAGNOSIS — C50211 Malignant neoplasm of upper-inner quadrant of right female breast: Secondary | ICD-10-CM | POA: Diagnosis not present

## 2024-03-24 NOTE — Progress Notes (Addendum)
 " [facility]  [address] [place],  [place]  [address] [phone]  Clinic Day: 03/24/2024  Referring physician: Keren Vicenta BRAVO, MD  ASSESSMENT & PLAN:  Assessment: Stage IA HER 2 positive, hormone receptor negative, right breast cancer diagnosed in November 2014, 10 years ago, who has no evidence of disease.  She was treated with surgery, partial chemotherapy and a full year of trastuzumab.   Osteopenia, minimal. She is taking calcium  and vitamin D. Her bone density scan on 05/04/2023 was normal with just minimal osteopenia of the left femur neck. She had a T-score of 0.2 for L1-L4, -1.2 for the left femur neck, and -0.3 for the left femur total mean.  Plan: Her bone density scan on 05/04/2023 was normal with just minimal osteopenia of the left femoral neck. She had a T-score of 0.2 for L1-L4, -1.2 for the left femur neck, and -0.3 for the left femur total mean. She completes routine labs with her PCP. I will see her back in 1 year for re-examination. I discussed the assessment and treatment plan with the patient and she was provided an opportunity to ask questions and all were answered. The patient agreed with the plan and demonstrated an understanding of the instructions.   I provided 14 minutes of face-to-face time during this this encounter and > 50% was spent counseling as documented under my assessment and plan.   [name], MD  [facility] DE[facility] OF [facility] [address] [place] [address] Dept: [phone] Dept Fax: [phone]   No orders of the defined types were placed in this encounter.  CHIEF COMPLAINT:  CC: History of stage IA HER2 receptor positive right breast cancer  Current Treatment:  Surveillance  HISTORY OF PRESENT ILLNESS:  [name] is a 74 y.o. female with a history of stage IA (T1c N0 M0) HER 2 Neu receptor positive right breast cancer diagnosed in  November of 2014.  She had multifocal disease on MRI scan, so opted for bilateral mastectomies with reconstruction, which was done in January 2015.  Pathology revealed multifocal, grade 3, invasive ductal carcinoma of the right breast with the largest lesion measuring 1.7 cm with negative nodes.  Estrogen and progesterone receptors were negative and HER 2 Neu positive.  Ki 67 was 57%.  Adjuvant chemotherapy with docetaxel, carboplatin and trastuzumab was recommended.  She received 2 cycles, but had severe toxicities and was hospitalized after each cycle.  We switched her to weekly paclitaxel with trastuzumab, but she still had significant toxicities with severe skin rash.  We then substituted Abraxane for the paclitaxel, and she did not tolerate this regimen either, so chemotherapy was discontinued.  She did complete a full year of trastuzumab.  Her last echocardiogram in January of 2016 remained normal.    I have reviewed her chart and materials related to her cancer extensively and collaborated history with the patient. Summary of oncologic history is as follows: Oncology History  Breast cancer, right breast (HCC)  12/27/2012 Cancer Staging   Staging form: Breast, AJCC 7th Edition - Clinical stage from 12/27/2012: Stage IA (T1c, N0, M0) - Signed by Cornish Wanda VEAR, MD on 01/22/2020 Prognostic indicators: 4 foci, up to 1.7 cm, pos. LVI   12/27/2012 Pathology Results   Breast, right, needle core biopsy:  -  Invasive ductal carcinoma HER2: Amplification  Ratio of HER2: cep 17 signals: 5.11  Average HER2 copy #/cell:  6.9 ER: Negative 0% PR: Negative 0% Ki67: 57%    01/04/2013 Breast MRI   1. A 1.6 x 1.5 x 1.5 cm irregular enhancing mass in the upper inner  right breast, corresponding with the biopsy-proven invasive ductal  carcinoma.   2. Large area of non mass enhancement surrounding the biopsied  invasive ductal carcinoma in the upper inner right breast. In  aggregate, the mass and  surrounding non mass enhancement measure 5 x  5 x 2.6 cm.   3. Nonmass enhancement in the anterior upper slightly outer right  breast. The anterior margin of this area of enhancement and the  posterior margin of the non mass enhancement surrounding the  biopsied carcinoma spans 7.1 cm.   4. Two additional masses in the inner right breast, one at the level  of the nipple measuring up to 6 mm and one in the inferior breast  measuring up to 6 mm.    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   Breast, right,needle core biopsy, inner central:  -  Invasive ductal carcinoma  -  Ductal carcinoma in situ HER2: Positive ER: Negative 0% PR: Negative 0% Ki67: 13%    02/01/2013 Initial Diagnosis   Breast cancer, right breast (HCC)   03/10/2013 Pathology Results   Lymph node, sentinel, biopsy, right axilla #1:  There is no evidence of carcinoma in 1 of 1 lymph node (0/1) Lymph node, sentinel, biopsy, right axilla #2:  There is no evidence of carcinoma in 1 of 1 lymph node (0/1) Breast, simple mastectomy, left:  Fibrocystic changes with calcifications   One benign lymph node (0/1)  There is no evidence of malignancy Breast, simple mastectomy, right:  Invasive ductal carcinoma, grade 3, multiple foci, the largest spans 1.7 cm  Ductal carcinoma in situ, high grade  Lymphovascular invasion is identified  The surgical resection margins are negative for carcinoma Skin, additional margins, left:  Benign skin  There is no evidence of malignancy Skin, additional margins, right:  Benign skin  There is no evidence of malignancy    04/2013 - 06/2013 Chemotherapy   Docetaxel, carboplatin and trastuzumab was recommended.  She received 2 cycles, but had severe toxicities and was hospitalized after each cycle.  We switched her to weekly paclitaxel with trastuzumab, but she still had significant toxicities with severe skin  rash.  We then substituted Abraxane for the paclitaxel, and she did not tolerate this regimen either, so chemotherapy was discontinued.     04/2013 - 04/2014 Chemotherapy   She did complete a full year of trastuzumab    INTERVAL HISTORY:  Alisea is here for clinical assessment of her history of stage IA HER2 receptor positive right breast cancer. Patient states that she feels well and has no complaints of pain. Her bone density scan on 05/04/2023 was normal with just minimal osteopenia of the left femoral neck. She had a T-score of 0.2 for L1-L4, -1.2 for the left femur neck, and -0.3 for the left femur total mean. She completes routine labs with her PCP. I will see her back in 1 year for re-examination.  She denies fever, chills, night sweats, or other signs of infection. She denies cardiorespiratory and gastrointestinal issues. She  denies pain. Her appetite is good and Her weight has decreased 13 pounds over last 1 year.  HISTORY:   Allergies:  Allergies  Allergen Reactions   Ace Inhibitors Cough  Cephalexin  Hives   Cephalosporins Hives    Possible allergy -- hives    Tramadol Hives   Codeine Itching, Nausea Only and Other (See Comments)    Current Medications: Current Outpatient Medications  Medication Sig Dispense Refill   metoprolol  succinate (TOPROL -XL) 50 MG 24 hr tablet Take 50 mg by mouth daily.     ALPRAZolam  (XANAX ) 0.25 MG tablet Take 1 tablet (0.25 mg total) by mouth 3 (three) times daily as needed for anxiety. 24 tablet 0   B Complex Vitamins (VITAMIN B COMPLEX) TABS Take 1 tablet by mouth daily.     blood glucose meter kit and supplies KIT Dispense based on patient and insurance preference. Use up to four times daily as directed. (FOR ICD-9 250.00, 250.01). 1 each 0   FLUoxetine  (PROZAC ) 20 MG capsule Take by mouth.     glucose blood test strip Use as instructed up to 3 times a day 100 each 12   loratadine  (CLARITIN ) 10 MG tablet TAKE 1 TABLET BY MOUTH EVERY DAY 90  tablet 1   losartan (COZAAR) 50 MG tablet Take 50 mg by mouth daily.     Multiple Vitamin (MULTI-VITAMIN) tablet Take 1 tablet by mouth daily.     OZEMPIC, 1 MG/DOSE, 4 MG/3ML SOPN Inject 1 mg into the skin once a week.     pantoprazole (PROTONIX) 40 MG tablet Take 40 mg by mouth daily.     No current facility-administered medications for this visit.    REVIEW OF SYSTEMS:  Review of Systems  Constitutional: Negative.  Negative for appetite change, chills, diaphoresis, fatigue, fever and unexpected weight change.  HENT:  Negative.  Negative for hearing loss, lump/mass, mouth sores, nosebleeds, sore throat, tinnitus, trouble swallowing and voice change.   Eyes:  Negative for eye problems and icterus.  Respiratory: Negative.  Negative for chest tightness, cough, hemoptysis, shortness of breath and wheezing.   Cardiovascular: Negative.  Negative for chest pain, leg swelling and palpitations.  Gastrointestinal: Negative.  Negative for abdominal distention, abdominal pain, blood in stool, constipation, diarrhea, nausea, rectal pain and vomiting.  Endocrine: Negative.   Genitourinary: Negative.  Negative for bladder incontinence, difficulty urinating, dyspareunia, dysuria, frequency, hematuria, menstrual problem, nocturia, pelvic pain, vaginal bleeding and vaginal discharge.   Musculoskeletal: Negative.  Negative for arthralgias, back pain, flank pain, gait problem, myalgias, neck pain and neck stiffness.  Skin: Negative.  Negative for itching, rash and wound.  Neurological: Negative.  Negative for dizziness, extremity weakness, gait problem, headaches, light-headedness, numbness, seizures and speech difficulty.  Hematological: Negative.  Negative for adenopathy. Does not bruise/bleed easily.  Psychiatric/Behavioral: Negative.  Negative for confusion, decreased concentration, depression, sleep disturbance and suicidal ideas. The patient is not nervous/anxious.     VITALS:  Blood pressure (!)  115/53, pulse 72, temperature 97.7 F (36.5 C), temperature source Oral, resp. rate 16, height 5' 4 (1.626 m), weight 222 lb 9.6 oz (101 kg), SpO2 100%.  Wt Readings from Last 3 Encounters:  03/24/24 222 lb 9.6 oz (101 kg)  03/11/23 235 lb 8 oz (106.8 kg)  01/29/22 230 lb 11.2 oz (104.6 kg)    Body mass index is 38.21 kg/m.  Performance status (ECOG): 0 - Asymptomatic  PHYSICAL EXAM:  Physical Exam Vitals and nursing note reviewed.  Constitutional:      General: She is not in acute distress.    Appearance: Normal appearance. She is normal weight. She is not ill-appearing, toxic-appearing or diaphoretic.  HENT:     Head: Normocephalic  and atraumatic.     Right Ear: Tympanic membrane, ear canal and external ear normal. There is no impacted cerumen.     Left Ear: Tympanic membrane, ear canal and external ear normal. There is no impacted cerumen.     Nose: Nose normal. No congestion or rhinorrhea.     Mouth/Throat:     Mouth: Mucous membranes are moist.     Pharynx: Oropharynx is clear. No oropharyngeal exudate or posterior oropharyngeal erythema.  Eyes:     General: No scleral icterus.       Right eye: No discharge.        Left eye: No discharge.     Extraocular Movements: Extraocular movements intact.     Conjunctiva/sclera: Conjunctivae normal.     Pupils: Pupils are equal, round, and reactive to light.  Neck:     Vascular: No carotid bruit.  Cardiovascular:     Rate and Rhythm: Normal rate and regular rhythm.     Pulses: Normal pulses.     Heart sounds: Normal heart sounds. No murmur heard.    No friction rub. No gallop.  Pulmonary:     Effort: Pulmonary effort is normal. No respiratory distress.     Breath sounds: Normal breath sounds. No stridor. No wheezing, rhonchi or rales.  Chest:     Chest wall: No tenderness.     Comments: Prominent venous pattern on the lateral right reconstruction and medial left reconstruction. Bilateral reconstructions are negative.   Abdominal:     General: Bowel sounds are normal. There is no distension.     Palpations: Abdomen is soft. There is no hepatomegaly, splenomegaly or mass.     Tenderness: There is no abdominal tenderness. There is no right CVA tenderness, left CVA tenderness, guarding or rebound.     Hernia: No hernia is present.  Musculoskeletal:        General: No swelling, tenderness, deformity or signs of injury. Normal range of motion.     Cervical back: Normal range of motion and neck supple. No rigidity or tenderness.     Right lower leg: No edema.     Left lower leg: No edema.  Lymphadenopathy:     Cervical: No cervical adenopathy.     Right cervical: No superficial, deep or posterior cervical adenopathy.    Left cervical: No superficial, deep or posterior cervical adenopathy.     Upper Body:     Right upper body: No supraclavicular, axillary or pectoral adenopathy.     Left upper body: No supraclavicular, axillary or pectoral adenopathy.  Skin:    General: Skin is warm and dry.     Coloration: Skin is not jaundiced or pale.     Findings: No bruising, erythema, lesion or rash.     Comments: Scar on the anterior tibia.  Neurological:     General: No focal deficit present.     Mental Status: She is alert and oriented to person, place, and time. Mental status is at baseline.     Cranial Nerves: No cranial nerve deficit.     Sensory: No sensory deficit.     Motor: No weakness.     Coordination: Coordination normal.     Gait: Gait normal.     Deep Tendon Reflexes: Reflexes normal.  Psychiatric:        Mood and Affect: Mood normal.        Behavior: Behavior normal.        Thought Content: Thought content normal.  Judgment: Judgment normal.    LABS:      Latest Ref Rng & Units 05/15/2021   12:00 AM 09/01/2018   10:35 AM 05/04/2017   11:16 AM  CBC  WBC  4.9     5.1  5.7   Hemoglobin 12.0 - 16.0 14.8     14.4  14.4   Hematocrit 36 - 46 46     43.1  41.6   Platelets 150 - 400 K/uL 135      164.0  182.0      This result is from an external source.      Latest Ref Rng & Units 05/15/2021   12:00 AM 09/01/2018   10:35 AM 06/10/2017    1:05 PM  CMP  Glucose 70 - 99 mg/dL  859  890   BUN 4 - 21 20     19  22    Creatinine 0.5 - 1.1 0.8     0.81  0.85   Sodium 137 - 147 138     139  141   Potassium 3.5 - 5.1 mEq/L 3.8     4.7  3.7   Chloride 99 - 108 105     102  101   CO2 13 - 22 28     31  31    Calcium  8.7 - 10.7 9.0     9.5  9.6   Total Protein 6.0 - 8.3 g/dL  6.7    Total Bilirubin 0.2 - 1.2 mg/dL  1.0    Alkaline Phos 25 - 125 72     71    AST 13 - 35 40     25    ALT 7 - 35 U/L 27     25       This result is from an external source.     STUDIES:  EXAM: 05/04/2023 DUAL X-RAY ABSORPTIOMETRY (DXA) FOR BONE MINERAL DENSITY AP Spine L1-L4 05/04/2023 72.5 Normal 0.2 1.208 g/cm2 DualFemur Neck Left 05/04/2023 72.5 Low Bone Mass -1.2 0.866 g/cm2 DualFemur Total Mean 05/04/2023 72.5 Normal -0.3 0.976 g/cm2  I, Aretta Cook, acting as a scribe for [name], MD, have documented all relevant documentation on the behalf of [name], MD, as directed by [name], MD, while in the presence of [name], MD.  I have reviewed this report as typed by the medical scribe, and it is complete and accurate.  "

## 2024-03-24 NOTE — Telephone Encounter (Signed)
 Patient has been scheduled for follow-up visit per 03/23/2024 LOS.  Pt given an appt calendar with date and time.

## 2024-03-25 ENCOUNTER — Ambulatory Visit: Attending: Vascular Surgery

## 2024-03-25 DIAGNOSIS — I83893 Varicose veins of bilateral lower extremities with other complications: Secondary | ICD-10-CM

## 2024-03-25 NOTE — Progress Notes (Signed)
 Treated pt's BLE spider and small reticular veins with Asclera 1%. Pt received a total of 3 vials (6 mL/60 mg) of Asclera 1% administered with a 27 gauge butterfly needle. Pt tolerated well. She has had several bleeds on both legs in the past. She was placed in 20-30 mm Hg knee high compression hose at the end of treatment and given post treatment care instructions. She will call us  if she has any questions/concerns that arise.

## 2025-03-28 ENCOUNTER — Inpatient Hospital Stay: Admitting: Oncology
